# Patient Record
Sex: Female | Born: 1990 | Race: White | Hispanic: No | Marital: Single | Smoking: Current every day smoker
Health system: Southern US, Community
[De-identification: ages and names within clinical notes are randomized; demographics above are authoritative.]

## PROBLEM LIST (undated history)

## (undated) ENCOUNTER — Emergency Department: Admission: EM | Payer: Medicaid Other | Source: Home / Self Care

## (undated) DIAGNOSIS — L409 Psoriasis, unspecified: Secondary | ICD-10-CM

## (undated) DIAGNOSIS — Z972 Presence of dental prosthetic device (complete) (partial): Secondary | ICD-10-CM

## (undated) DIAGNOSIS — K219 Gastro-esophageal reflux disease without esophagitis: Secondary | ICD-10-CM

## (undated) DIAGNOSIS — N879 Dysplasia of cervix uteri, unspecified: Secondary | ICD-10-CM

## (undated) HISTORY — PX: APPENDECTOMY: SHX54

## (undated) HISTORY — PX: ABDOMINAL SURGERY: SHX537

---
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
## 2011-09-28 DIAGNOSIS — N879 Dysplasia of cervix uteri, unspecified: Secondary | ICD-10-CM

## 2011-09-28 HISTORY — DX: Dysplasia of cervix uteri, unspecified: N87.9

## 2011-10-02 ENCOUNTER — Emergency Department: Payer: Self-pay | Admitting: Emergency Medicine

## 2011-11-24 ENCOUNTER — Emergency Department: Payer: Self-pay | Admitting: Emergency Medicine

## 2011-11-26 LAB — BETA STREP CULTURE(ARMC)

## 2012-02-18 ENCOUNTER — Observation Stay: Payer: Self-pay | Admitting: Obstetrics and Gynecology

## 2012-02-18 LAB — COMPREHENSIVE METABOLIC PANEL
Albumin: 2.2 g/dL — ABNORMAL LOW (ref 3.4–5.0)
Alkaline Phosphatase: 133 U/L (ref 50–136)
Bilirubin,Total: 0.2 mg/dL (ref 0.2–1.0)
Calcium, Total: 8.4 mg/dL — ABNORMAL LOW (ref 8.5–10.1)
Chloride: 105 mmol/L (ref 98–107)
Co2: 25 mmol/L (ref 21–32)
Creatinine: 0.55 mg/dL — ABNORMAL LOW (ref 0.60–1.30)
EGFR (Non-African Amer.): >60
Glucose: 79 mg/dL (ref 65–99)
Potassium: 4 mmol/L (ref 3.5–5.1)
Sodium: 139 mmol/L (ref 136–145)

## 2012-02-18 LAB — CBC WITH DIFFERENTIAL/PLATELET
Basophil #: 0 10*3/uL (ref 0.0–0.1)
Basophil %: 0.2 %
Eosinophil #: 0.2 10*3/uL (ref 0.0–0.7)
Eosinophil %: 1.5 %
HCT: 35.8 % (ref 35.0–47.0)
HGB: 11.7 g/dL — ABNORMAL LOW (ref 12.0–16.0)
MCH: 29.6 pg (ref 26.0–34.0)
MCHC: 32.6 g/dL (ref 32.0–36.0)
MCV: 91 fL (ref 80–100)
Monocyte #: 1.2 x10 3/mm — ABNORMAL HIGH (ref 0.2–0.9)
Monocyte %: 7 %
Neutrophil %: 78.6 %
RBC: 3.93 10*6/uL (ref 3.80–5.20)
WBC: 16.6 10*3/uL — ABNORMAL HIGH (ref 3.6–11.0)

## 2012-02-18 LAB — URINALYSIS, COMPLETE
Bacteria: NONE SEEN
Bilirubin,UR: NEGATIVE
Blood: NEGATIVE
Blood: NEGATIVE
Glucose,UR: NEGATIVE mg/dL (ref 0–75)
Ketone: NEGATIVE
Leukocyte Esterase: NEGATIVE
Nitrite: NEGATIVE
Ph: 8 (ref 4.5–8.0)
Protein: NEGATIVE
RBC,UR: 2 /HPF (ref 0–5)
RBC,UR: 1 /HPF (ref 0–5)
Squamous Epithelial: 40
Squamous Epithelial: <1
WBC UR: 11 /HPF (ref 0–5)
WBC UR: 2 /HPF (ref 0–5)

## 2012-02-18 LAB — LIPASE, BLOOD: Lipase: 139 U/L (ref 73–393)

## 2012-02-18 LAB — AMYLASE: Amylase: 40 U/L (ref 25–115)

## 2012-02-19 LAB — CBC WITH DIFFERENTIAL/PLATELET
Basophil %: 0.6 %
Eosinophil #: 0.2 10*3/uL (ref 0.0–0.7)
Eosinophil %: 1.5 %
HCT: 34.5 % — ABNORMAL LOW (ref 35.0–47.0)
Lymphocyte %: 16.2 %
MCHC: 32.7 g/dL (ref 32.0–36.0)
MCV: 91 fL (ref 80–100)
Monocyte #: 1 x10 3/mm — ABNORMAL HIGH (ref 0.2–0.9)
Monocyte %: 7.4 %
Neutrophil #: 10.4 10*3/uL — ABNORMAL HIGH (ref 1.4–6.5)
Neutrophil %: 74.3 %
RBC: 3.82 10*6/uL (ref 3.80–5.20)
RDW: 13.3 % (ref 11.5–14.5)
WBC: 14 10*3/uL — ABNORMAL HIGH (ref 3.6–11.0)

## 2012-02-21 ENCOUNTER — Observation Stay: Payer: Self-pay | Admitting: Obstetrics and Gynecology

## 2012-02-21 LAB — URINALYSIS, COMPLETE
Bacteria: NONE SEEN
Blood: NEGATIVE
Glucose,UR: NEGATIVE mg/dL (ref 0–75)
Leukocyte Esterase: NEGATIVE
Nitrite: NEGATIVE
Ph: 7 (ref 4.5–8.0)
Protein: NEGATIVE
RBC,UR: <1 /HPF (ref 0–5)
Specific Gravity: 1.01 (ref 1.003–1.030)
WBC UR: 3 /HPF (ref 0–5)

## 2012-02-25 ENCOUNTER — Observation Stay: Payer: Self-pay | Admitting: Obstetrics and Gynecology

## 2012-03-20 ENCOUNTER — Observation Stay: Payer: Self-pay | Admitting: Obstetrics and Gynecology

## 2012-03-24 ENCOUNTER — Observation Stay: Payer: Self-pay | Admitting: Obstetrics and Gynecology

## 2012-03-24 LAB — PROTEIN / CREATININE RATIO, URINE
Protein, Random Urine: 30 mg/dL — ABNORMAL HIGH (ref 0–12)
Protein/Creat. Ratio: 249 mg/gCREAT — ABNORMAL HIGH (ref 0–200)

## 2012-03-24 LAB — PIH PROFILE
EGFR (Non-African Amer.): >60
Glucose: 65 mg/dL (ref 65–99)
HCT: 36.4 % (ref 35.0–47.0)
HGB: 11.6 g/dL — ABNORMAL LOW (ref 12.0–16.0)
MCV: 92 fL (ref 80–100)
Platelet: 214 10*3/uL (ref 150–440)
RBC: 3.94 10*6/uL (ref 3.80–5.20)
SGOT(AST): 13 U/L — ABNORMAL LOW (ref 15–37)
Uric Acid: 4.7 mg/dL (ref 2.6–6.0)

## 2012-03-26 ENCOUNTER — Inpatient Hospital Stay: Payer: Self-pay | Admitting: Obstetrics and Gynecology

## 2012-03-26 LAB — CBC WITH DIFFERENTIAL/PLATELET
Basophil #: 0 10*3/uL (ref 0.0–0.1)
Eosinophil #: 0.2 10*3/uL (ref 0.0–0.7)
Eosinophil %: 1.2 %
Lymphocyte #: 1.8 10*3/uL (ref 1.0–3.6)
Lymphocyte %: 13.1 %
MCH: 29.2 pg (ref 26.0–34.0)
MCHC: 32.2 g/dL (ref 32.0–36.0)
MCV: 91 fL (ref 80–100)
Monocyte #: 1.1 x10 3/mm — ABNORMAL HIGH (ref 0.2–0.9)
Monocyte %: 7.8 %
Neutrophil #: 10.5 10*3/uL — ABNORMAL HIGH (ref 1.4–6.5)
Neutrophil %: 77.7 %
Platelet: 221 10*3/uL (ref 150–440)
RBC: 3.85 10*6/uL (ref 3.80–5.20)
RDW: 14.2 % (ref 11.5–14.5)
WBC: 13.6 10*3/uL — ABNORMAL HIGH (ref 3.6–11.0)

## 2012-03-27 LAB — CBC WITH DIFFERENTIAL/PLATELET
Basophil %: 0.4 %
Eosinophil #: 0.2 10*3/uL (ref 0.0–0.7)
HCT: 36.8 % (ref 35.0–47.0)
HGB: 11.9 g/dL — ABNORMAL LOW (ref 12.0–16.0)
Lymphocyte #: 1.7 10*3/uL (ref 1.0–3.6)
Lymphocyte %: 9.8 %
MCHC: 32.2 g/dL (ref 32.0–36.0)
Monocyte %: 6.4 %
Neutrophil #: 14.4 10*3/uL — ABNORMAL HIGH (ref 1.4–6.5)
WBC: 17.5 10*3/uL — ABNORMAL HIGH (ref 3.6–11.0)

## 2012-03-29 LAB — HEMATOCRIT: HCT: 27.2 % — ABNORMAL LOW (ref 35.0–47.0)

## 2012-04-16 ENCOUNTER — Emergency Department: Payer: Self-pay | Admitting: Emergency Medicine

## 2012-04-16 LAB — COMPREHENSIVE METABOLIC PANEL
Albumin: 3.2 g/dL — ABNORMAL LOW (ref 3.4–5.0)
Alkaline Phosphatase: 117 U/L (ref 50–136)
BUN: 10 mg/dL (ref 7–18)
Bilirubin,Total: 0.6 mg/dL (ref 0.2–1.0)
Co2: 23 mmol/L (ref 21–32)
Creatinine: 0.73 mg/dL (ref 0.60–1.30)
Osmolality: 282 (ref 275–301)
Potassium: 3.9 mmol/L (ref 3.5–5.1)
SGPT (ALT): 17 U/L
Sodium: 142 mmol/L (ref 136–145)
Total Protein: 7.1 g/dL (ref 6.4–8.2)

## 2012-04-16 LAB — CBC
MCH: 28.4 pg (ref 26.0–34.0)
MCHC: 32 g/dL (ref 32.0–36.0)
MCV: 89 fL (ref 80–100)
Platelet: 387 10*3/uL (ref 150–440)
RBC: 4.26 10*6/uL (ref 3.80–5.20)

## 2012-04-16 LAB — URINALYSIS, COMPLETE
Bilirubin,UR: NEGATIVE
Blood: NEGATIVE
Ketone: NEGATIVE
Ph: 5 (ref 4.5–8.0)
Protein: NEGATIVE
Specific Gravity: 1.021 (ref 1.003–1.030)
Squamous Epithelial: 2

## 2013-07-11 ENCOUNTER — Emergency Department: Payer: Self-pay | Admitting: Emergency Medicine

## 2013-12-22 ENCOUNTER — Emergency Department: Payer: Self-pay | Admitting: Emergency Medicine

## 2013-12-22 LAB — CBC WITH DIFFERENTIAL/PLATELET
BASOS ABS: 0 10*3/uL (ref 0.0–0.1)
Basophil %: 0.1 %
Eosinophil #: 0.3 10*3/uL (ref 0.0–0.7)
Eosinophil %: 1.7 %
HCT: 47.4 % — ABNORMAL HIGH (ref 35.0–47.0)
HGB: 15.6 g/dL (ref 12.0–16.0)
LYMPHS ABS: 0.9 10*3/uL — AB (ref 1.0–3.6)
Lymphocyte %: 4.9 %
MCH: 29.7 pg (ref 26.0–34.0)
MCHC: 32.8 g/dL (ref 32.0–36.0)
MCV: 90 fL (ref 80–100)
MONOS PCT: 3.1 %
Monocyte #: 0.6 x10 3/mm (ref 0.2–0.9)
NEUTROS ABS: 17.2 10*3/uL — AB (ref 1.4–6.5)
Neutrophil %: 90.2 %
PLATELETS: 250 10*3/uL (ref 150–440)
RBC: 5.25 10*6/uL — ABNORMAL HIGH (ref 3.80–5.20)
RDW: 12.8 % (ref 11.5–14.5)
WBC: 19.1 10*3/uL — ABNORMAL HIGH (ref 3.6–11.0)

## 2013-12-22 LAB — COMPREHENSIVE METABOLIC PANEL
ALBUMIN: 3.3 g/dL — AB (ref 3.4–5.0)
ALT: 14 U/L (ref 12–78)
Alkaline Phosphatase: 93 U/L
Anion Gap: 6 — ABNORMAL LOW (ref 7–16)
BUN: 11 mg/dL (ref 7–18)
Bilirubin,Total: 0.7 mg/dL (ref 0.2–1.0)
Calcium, Total: 7.9 mg/dL — ABNORMAL LOW (ref 8.5–10.1)
Chloride: 113 mmol/L — ABNORMAL HIGH (ref 98–107)
Co2: 20 mmol/L — ABNORMAL LOW (ref 21–32)
Creatinine: 0.74 mg/dL (ref 0.60–1.30)
EGFR (African American): >60
EGFR (Non-African Amer.): >60
GLUCOSE: 83 mg/dL (ref 65–99)
Osmolality: 276 (ref 275–301)
POTASSIUM: 3.6 mmol/L (ref 3.5–5.1)
SGOT(AST): 22 U/L (ref 15–37)
Sodium: 139 mmol/L (ref 136–145)
TOTAL PROTEIN: 6.5 g/dL (ref 6.4–8.2)

## 2013-12-22 LAB — URINALYSIS, COMPLETE
BILIRUBIN, UR: NEGATIVE
Bacteria: NONE SEEN
Blood: NEGATIVE
GLUCOSE, UR: NEGATIVE mg/dL (ref 0–75)
KETONE: NEGATIVE
Leukocyte Esterase: NEGATIVE
NITRITE: NEGATIVE
Ph: 5 (ref 4.5–8.0)
Protein: NEGATIVE
RBC,UR: <1 /HPF (ref 0–5)
Specific Gravity: 1.023 (ref 1.003–1.030)
Squamous Epithelial: 2

## 2013-12-22 LAB — HCG, QUANTITATIVE, PREGNANCY

## 2013-12-22 LAB — GC/CHLAMYDIA PROBE AMP

## 2013-12-22 LAB — LIPASE, BLOOD: Lipase: 83 U/L (ref 73–393)

## 2013-12-22 LAB — WET PREP, GENITAL

## 2014-11-28 ENCOUNTER — Emergency Department: Payer: Self-pay | Admitting: Emergency Medicine

## 2015-01-19 NOTE — Discharge Summary (Signed)
PATIENT NAME:  Mandy Hansen, Mandy Hansen MR#:  [id number] DATE OF BIRTH:  Sep 19, 1991  DATE OF ADMISSION:  03/26/2012 DATE OF DISCHARGE:  03/31/2012  PRINCIPLE PROCEDURE: Low transverse cesarean section.   HOSPITAL COURSE: The patient underwent the above procedure, uncomplicated. Postoperative day #1 hematocrit was 27.2%.  She was discharged to home in good condition on postoperative day #3. The patient will follow up with Dr. Feliberto GottronSchermerhorn in two weeks for wound care or before if she has wound drainage, fever, nausea, or vomiting.   DISCHARGE MEDICATIONS: Vicodin, prenatal vitamins, ibuprofen.   ____________________________ Suzy Bouchardhomas J. Everitt Wenner, MD tjs:bjt Hansen: 04/07/2012 10:15:18 ET T: 04/07/2012 11:31:01 ET JOB#: [id number]  cc: Suzy Bouchardhomas J. Lason Eveland, MD, <Dictator> Suzy BouchardHOMAS J Shaguana Love MD ELECTRONICALLY SIGNED 04/10/2012 8:45

## 2015-01-19 NOTE — Op Note (Signed)
PATIENT NAME:  Mandy Hansen, Mandy Hansen MR#:  [id number] DATE OF BIRTH:  12-Oct-1990  DATE OF PROCEDURE:  03/28/2012  PREOPERATIVE DIAGNOSIS: Arrest of descent.  POSTOPERATIVE DIAGNOSES: 1. Arrest of descent.  2. Thick meconium staining.   PROCEDURES:  1. Primary low transverse Cesarean section.  2. On-Q pump placement.   ANESTHESIA: Spinal.   SURGEON: Suzy Bouchardhomas J. Hadi Dubin, MD   FIRST ASSISTANT: Ore, scrub tech    INDICATIONS: This is a 24 year old gravida 1 para 0 patient laboring greater than 24 hours, did not progress past complete/complete/+2 station. Fetal tachycardia noted. The patient pushed for three hours.   PROCEDURE: After adequate spinal anesthesia, the patient was placed in the dorsal supine position with a hip roll under the right side. The patient's abdomen was prepped and draped in a normal sterile fashion. Pfannenstiel incision was made two fingerbreadths above the symphysis pubis. Sharp dissection was used to identify the fascia. Fascia was opened in the midline in a transverse fashion. Superior aspect of the fascia was grasped with Kocher clamps and the recti muscles dissected free. Inferior aspect of the fascia was grasped with Kocher clamps. Pyramidalis muscle was dissected free. Entry into the peritoneal cavity was accomplished sharply. Vesicouterine peritoneal fold was identified and opened and a bladder flap was created and bladder was reflected inferiorly. Low transverse uterine incision was made. Upon entry into the endometrial cavity, thick meconium was noted. Vaginal hand allowed for elevation of extremely wedged fetal head. Head was ultimately delivered and body was delivered without difficulty. Cord was doubly clamped. Infant boy was passed to nursery staff before spontaneous cry. Intubation was noted with no meconium below the cords. Apgar scores 8 and 9. Blood gas 7.15, pCO2 70, -5 base excess. Placenta was manually delivered and 40 units of intravenous Pitocin was  administered, 250 mcg of Hemabate also administered given uterine atony. Endometrial cavity was wiped clean after exteriorizing the uterus. Uterine incision was closed with 1 chromic suture in a running locking fashion with good approximation of edges. Good hemostasis was noted. Fallopian tubes and ovaries appeared normal. Posterior cul-de-sac was irrigated and suctioned. Uterus was placed back into the abdominal cavity. Paracolic gutters were wiped clean with laparotomy tape. Uterine incision again appeared hemostatic. Intercede was placed over the uterine incision in a T-shaped fashion. Superior aspect of the fascia was then grasped and the On-Q pump catheters were placed subfascially and the fascia was then closed over top of these catheters with 0 Vicryl suture. Subcutaneous tissues were irrigated and bovied for hemostasis and skin was reapproximated with staples. On-Q pump catheters were then secured with Dermabond and were then loaded with 5 mL of 0.5% Marcaine. A sterile dressing was applied. No complications.   Estimated blood loss 500 mL. Intraoperative fluids 500 mL. Urine output 45 mL. The patient did receive 2 grams IV Ancef prior to commencement of the case.    ____________________________ Suzy Bouchardhomas J. Lenita Peregrina, MD tjs:drc Hansen: 03/28/2012 02:42:03 ET T: 03/28/2012 11:56:54 ET JOB#: [id number]  cc: Suzy Bouchardhomas J. Maeli Spacek, MD, <Dictator> Suzy BouchardHOMAS J Tiaria Biby MD ELECTRONICALLY SIGNED 03/29/2012 9:06

## 2015-02-04 NOTE — H&P (Signed)
L&D Evaluation:  History:   [facility] 24[id number]    Presents with abdominal pain, eval elev BP    Patient's Medical History No Chronic Illness    Patient's Surgical History none    Medications Pre Natal Vitamins    Allergies NKDA    Social History THC    Family History Non-Contributory   ROS:   ROS All systems were reviewed.  HEENT, CNS, GI, GU, Respiratory, CV, Renal and Musculoskeletal systems were found to be normal., UC's Q15 min   Exam:   General no apparent distress    Heart normal sinus rhythm    Abdomen gravid, non-tender    Estimated Fetal Weight Average for gestational age    Edema 1+    Reflexes 1+    Clonus negative    Mebranes Intact    FHT normal rate with no decels    Ucx irregular   Impression:   Impression evaluation for PIH, R/O labor   Plan:   Comments await labs will induce by Monday; sooner if pre-eclampsia evident BP 130's/60's here   Electronic Signatures: Margaretha GlassingEvans, Ricky L (MD)  (Signed 28-Jun-13 17:11)  Authored: L&D Evaluation   Last Updated: 28-Jun-13 17:11 by Margaretha GlassingEvans, Ricky L (MD)

## 2015-02-04 NOTE — H&P (Signed)
L&D Evaluation:  History:   HPI 24 yo G1P0 EDC 03/20/12 @ 35+4 with 3 day h/o worsening left flank pain presented to ER for eval. + D-Dimer prompted ED to do a chest CT( negative). Pt witth no N/V/ fever . Elevated WBC 16K with shift. Appetite nl. No dysuria , n o h/o urolithiasis or kidney infection.    Patient's Medical History No Chronic Illness  cervical dysplasia    Patient's Surgical History wisdom tooth extraction    Medications Pre Natal Vitamins    Allergies NKDA    Social History tobacco  3 cigs / day    Family History Non-Contributory   ROS:   ROS All systems were reviewed.  HEENT, CNS, GI, GU, Respiratory, CV, Renal and Musculoskeletal systems were found to be normal.   Exam:   Vital Signs BP 120-140's /60-80's    Urine Protein + LE and WBC + epithelial cells    General no apparent distress    Mental Status clear    Chest clear    Heart normal sinus rhythm    Abdomen left flank TTP + CVAT left    Back CVAT, left    Edema no edema    Pelvic cervix closed    Mebranes Intact    FHT normal rate with no decels    FHT Description reactive NST    Fetal Heart Rate 130    Ucx q 7 min   Impression:   Impression left flank pain  elevated WBC possible hydronephritis( early ) being afebrile. Pt is an active smoker and has chronic cough possible costochondritis. Reassuring fetal monitoring   Plan:   Plan cath UA , renal u/s looking for hydronephrosis. Start ancef 2 gm q 6 hrs iv. Will observe overnight. Norco prn pain   Electronic Signatures: Schermerhorn, Ihor Austinhomas J (MD)  (Signed [phone]-May-13 20:10)  Authored: L&D Evaluation   Last Updated: 24-May-13 20:10 by Schermerhorn, Ihor Austinhomas J (MD)

## 2015-02-04 NOTE — H&P (Signed)
L&D Evaluation:  History:   [facility] 24 [facility]    Presents with abdominal pain, contractions, rib pain evaluated 5/25    Patient's Medical History No Chronic Illness    Patient's Surgical History none    Medications Pre Natal Vitamins    Allergies NKDA    Family History Non-Contributory   ROS:   ROS left rib pain with cough    General normal    HEENT normal    CNS normal    GI normal    GU normal    Resp cough    CV normal    Renal normal   Exam:   Vital Signs stable    General unjcomfortable with UC's, in area of rib pain    Chest clear    Abdomen gravid, non-tender    Estimated Fetal Weight Average for gestational age    Back no CVAT    FHT normal rate with no decels    Ucx irregular    Skin dry    Other felt a "crack" in the previously noted area of tenderness at left rib this am with a strong cough   Impression:   Impression PTL, probable fractured rib   Plan:   Comments evaluate for cervical change pain mgt for fractured rib   Electronic Signatures: Margaretha GlassingEvans, Ricky L (MD)  (Signed 27-May-13 11:18)  Authored: L&D Evaluation   Last Updated: 27-May-13 11:18 by Margaretha GlassingEvans, Ricky L (MD)

## 2015-05-08 ENCOUNTER — Emergency Department
Admission: EM | Admit: 2015-05-08 | Discharge: 2015-05-08 | Disposition: A | Discharge status: Home / Self Care | Payer: Medicaid Other | Attending: Emergency Medicine | Admitting: Emergency Medicine

## 2015-05-08 ENCOUNTER — Emergency Department: Payer: Medicaid Other

## 2015-05-08 ENCOUNTER — Encounter: Payer: Self-pay | Admitting: *Deleted

## 2015-05-08 DIAGNOSIS — Z79899 Other long term (current) drug therapy: Secondary | ICD-10-CM | POA: Diagnosis not present

## 2015-05-08 DIAGNOSIS — N9489 Other specified conditions associated with female genital organs and menstrual cycle: Secondary | ICD-10-CM | POA: Insufficient documentation

## 2015-05-08 DIAGNOSIS — R109 Unspecified abdominal pain: Secondary | ICD-10-CM | POA: Diagnosis present

## 2015-05-08 DIAGNOSIS — R1033 Periumbilical pain: Secondary | ICD-10-CM

## 2015-05-08 DIAGNOSIS — Z3202 Encounter for pregnancy test, result negative: Secondary | ICD-10-CM | POA: Diagnosis not present

## 2015-05-08 DIAGNOSIS — Z720 Tobacco use: Secondary | ICD-10-CM | POA: Insufficient documentation

## 2015-05-08 DIAGNOSIS — Z792 Long term (current) use of antibiotics: Secondary | ICD-10-CM | POA: Insufficient documentation

## 2015-05-08 LAB — COMPREHENSIVE METABOLIC PANEL
ALBUMIN: 4.3 g/dL (ref 3.5–5.0)
ALT: 18 U/L (ref 14–54)
ANION GAP: 7 (ref 5–15)
AST: 22 U/L (ref 15–41)
Alkaline Phosphatase: 55 U/L (ref 38–126)
BUN: 13 mg/dL (ref 6–20)
CALCIUM: 9.5 mg/dL (ref 8.9–10.3)
CO2: 23 mmol/L (ref 22–32)
CREATININE: 0.74 mg/dL (ref 0.44–1.00)
Chloride: 108 mmol/L (ref 101–111)
GFR calc non Af Amer: >60 mL/min (ref >60)
GLUCOSE: 101 mg/dL — AB (ref 65–99)
Potassium: 4 mmol/L (ref 3.5–5.1)
Sodium: 138 mmol/L (ref 135–145)
TOTAL PROTEIN: 7.3 g/dL (ref 6.5–8.1)
Total Bilirubin: 0.7 mg/dL (ref 0.3–1.2)

## 2015-05-08 LAB — URINALYSIS COMPLETE WITH MICROSCOPIC (ARMC ONLY)
BILIRUBIN URINE: NEGATIVE
Glucose, UA: NEGATIVE mg/dL
Hgb urine dipstick: NEGATIVE
Ketones, ur: NEGATIVE mg/dL
Leukocytes, UA: NEGATIVE
Nitrite: NEGATIVE
PROTEIN: NEGATIVE mg/dL
Specific Gravity, Urine: 1.012 (ref 1.005–1.030)
pH: 7 (ref 5.0–8.0)

## 2015-05-08 LAB — CBC
HCT: 43.1 % (ref 35.0–47.0)
Hemoglobin: 14.1 g/dL (ref 12.0–16.0)
MCH: 29.8 pg (ref 26.0–34.0)
MCHC: 32.8 g/dL (ref 32.0–36.0)
MCV: 90.7 fL (ref 80.0–100.0)
PLATELETS: 260 10*3/uL (ref 150–440)
RBC: 4.75 MIL/uL (ref 3.80–5.20)
RDW: 13.1 % (ref 11.5–14.5)
WBC: 8.4 10*3/uL (ref 3.6–11.0)

## 2015-05-08 LAB — POCT PREGNANCY, URINE: PREG TEST UR: NEGATIVE

## 2015-05-08 LAB — LIPASE, BLOOD: LIPASE: 27 U/L (ref 22–51)

## 2015-05-08 MED ORDER — OXYCODONE-ACETAMINOPHEN 5-325 MG PO TABS
1.0000 | ORAL_TABLET | Freq: Once | ORAL | Status: AC
  Administered 2015-05-08: 1 via ORAL

## 2015-05-08 MED ORDER — ONDANSETRON HCL 4 MG/2ML IJ SOLN
INTRAMUSCULAR | Status: AC
  Administered 2015-05-08: 4 mg via INTRAVENOUS
  Filled 2015-05-08: qty 2

## 2015-05-08 MED ORDER — HYDROMORPHONE HCL 1 MG/ML IJ SOLN
0.5000 mg | INTRAMUSCULAR | Status: AC | PRN
  Administered 2015-05-08 (×2): 0.5 mg via INTRAVENOUS
  Filled 2015-05-08: qty 1

## 2015-05-08 MED ORDER — IOHEXOL 300 MG/ML  SOLN
100.0000 mL | Freq: Once | INTRAMUSCULAR | Status: AC | PRN
  Administered 2015-05-08: 100 mL via INTRAVENOUS

## 2015-05-08 MED ORDER — OXYCODONE-ACETAMINOPHEN 5-325 MG PO TABS: 1.0000 | ORAL_TABLET | Freq: Four times a day (QID) | ORAL | Status: DC | PRN

## 2015-05-08 MED ORDER — IOHEXOL 240 MG/ML SOLN
25.0000 mL | Freq: Once | INTRAMUSCULAR | Status: AC | PRN
  Administered 2015-05-08: 25 mL via ORAL

## 2015-05-08 MED ORDER — OXYCODONE-ACETAMINOPHEN 5-325 MG PO TABS
ORAL_TABLET | ORAL | Status: AC
  Filled 2015-05-08: qty 1

## 2015-05-08 MED ORDER — ONDANSETRON HCL 4 MG/2ML IJ SOLN
4.0000 mg | Freq: Once | INTRAMUSCULAR | Status: AC
  Administered 2015-05-08: 4 mg via INTRAVENOUS

## 2015-05-08 MED ORDER — SODIUM CHLORIDE 0.9 % IV BOLUS (SEPSIS)
1000.0000 mL | Freq: Once | INTRAVENOUS | Status: AC
  Administered 2015-05-08: 1000 mL via INTRAVENOUS

## 2015-05-08 MED ORDER — ONDANSETRON HCL 4 MG/2ML IJ SOLN
4.0000 mg | Freq: Once | INTRAMUSCULAR | Status: AC
  Administered 2015-05-08: 4 mg via INTRAVENOUS
  Filled 2015-05-08: qty 2

## 2015-05-08 NOTE — ED Provider Notes (Signed)
[facility] Emergency Department Provider Note  ____________________________________________  Time seen: 11:25 AM  I have reviewed the triage vital signs and the nursing notes.   HISTORY  Chief Complaint Abdominal Pain     HPI Mandy Hansen is a 24 y.o. female with abdominal pain. She reports this pain began in March and has been waxing and waning since. She was seen in the emergency department earlier this year for the abdominal pain and had an ultrasound of her pelvic area done and told to follow-up with GYN area she has not had a CT scan, per her report.  The pain has been worse for the past 2-3 days. She went to CT when this morning. During the abdominal exam by the gynecologist, she began to have acutely worse pain and it has continued since that exam. She was sent to the emergency department for further evaluation.  There is no known gynecologic problem diagnosis to date. She denies any ongoing medical problems.   History reviewed. No pertinent past medical history.  There are no active problems to display for this patient.   Past Surgical History  Procedure Laterality Date  . Cesarean section      Current Outpatient Rx  Name  Route  Sig  Dispense  Refill  . acetaminophen (TYLENOL) 325 MG tablet   Oral   Take 650 mg by mouth every 4 (four) hours as needed for mild pain, moderate pain, fever or headache.          . ibuprofen (ADVIL,MOTRIN) 200 MG tablet   Oral   Take 800 mg by mouth every 6 (six) hours as needed for fever, headache, mild pain, moderate pain or cramping.          . medroxyPROGESTERone (DEPO-PROVERA) 150 MG/ML injection   Intramuscular   Inject 1 mL into the muscle every 3 (three) months.         . potassium chloride (MICRO-K) 10 MEQ CR capsule   Oral   Take 1 capsule by mouth 2 (two) times daily.         [name] oxyCODONE-acetaminophen (ROXICET) 5-325 MG per tablet   Oral   Take 1 tablet by mouth every 6 (six) hours  as needed.   16 tablet   0     Allergies Review of patient's allergies indicates no known allergies.  No family history on file.  Social History Social History  Substance Use Topics  . Smoking status: Current Every Day Smoker  . Smokeless tobacco: None  . Alcohol Use: No    Review of Systems  Constitutional: Negative for fever. ENT: Negative for sore throat. Cardiovascular: Negative for chest pain. Respiratory: Negative for shortness of breath. Gastrointestinal: Positive for abdominal pain. See history of present illness. Genitourinary: Negative for dysuria. Musculoskeletal: No myalgias or injuries. Skin: Negative for rash. Neurological: Negative for headaches   10-point ROS otherwise negative.  ____________________________________________   PHYSICAL EXAM:  VITAL SIGNS: ED Triage Vitals  Enc Vitals Group     BP 05/08/15 0955 133/69 mmHg     Pulse Rate 05/08/15 0955 95     Resp 05/08/15 0955 20     Temp 05/08/15 0955 98.1 F (36.7 C)     Temp Source 05/08/15 0955 Oral     SpO2 05/08/15 0955 98 %     Weight 05/08/15 0955 142 lb (64.411 kg)     Height 05/08/15 0955  (1.6 m)     Head Cir --  Peak Flow --      Pain Score 05/08/15 0955 10     Pain Loc --      Pain Edu? --      Excl. in GC? --     Constitutional:  Alert and oriented. Patient is in moderate distress with abdominal discomfort. ENT   Head: Normocephalic and atraumatic.   Nose: No congestion/rhinnorhea.   Mouth/Throat: Mucous membranes are moist. Cardiovascular: Normal rate, regular rhythm, no murmur noted Respiratory:  Normal respiratory effort, no tachypnea.    Breath sounds are clear and equal bilaterally.  Gastrointestinal: Soft.  Significant tenderness, mostly in the right lower quadrant and some in the area umbilical area. She has mild guarding. No distention.  Back: No muscle spasm, no tenderness, no CVA tenderness. Musculoskeletal: No deformity noted. Nontender with  normal range of motion in all extremities.  No noted edema. Neurologic:  Normal speech and language. No gross focal neurologic deficits are appreciated.  Skin:  Skin is warm, dry. No rash noted. Psychiatric: Mood and affect are normal given the discomfort and pain she is in. Intact thought process.[name] Speech and behavior are normal.  ____________________________________________    LABS (pertinent positives/negatives)  Labs Reviewed  COMPREHENSIVE METABOLIC PANEL - Abnormal; Notable for the following:    Glucose, Bld 101 (*)    All other components within normal limits  URINALYSIS COMPLETEWITH MICROSCOPIC ([facility] ONLY) - Abnormal; Notable for the following:    Color, Urine YELLOW (*)    APPearance HAZY (*)    Bacteria, UA RARE (*)    Squamous Epithelial / LPF 0-5 (*)    All other components within normal limits  LIPASE, BLOOD  CBC  POC URINE PREG, ED  POCT PREGNANCY, URINE     ____________________________________________   RADIOLOGY  CT abdomen and pelvis  IMPRESSION: 1. No acute abnormality identified in the abdomen or pelvis. 2. Multiple enlarged veins surrounding the uterine fundus and in the adnexa, greater on the left and slightly more prominent than on the prior study. Pelvic congestion syndrome is a consideration in the appropriate clinical setting.   ____________________________________________   INITIAL IMPRESSION / ASSESSMENT AND PLAN / ED COURSE  Pertinent labs & imaging results that were available during my care of the patient were reviewed by me and considered in my medical decision making (see chart for details).   24 year old female with acute on chronic abdominal pain. Given the location and severity of her pain, a CT scan is indicated this time to evaluate for possible prior appendicitis with abscess versus inflammatory bowel versus other pathology. The patient is in agreement with this plan.  ----------------------------------------- 2:12 PM on  05/08/2015 -----------------------------------------  Patient has had her CT scan. The results are pending. She has had some additional nausea. Her pain control with the initial dose of Dilaudid has been moderate. I believe she could use additional medication. I discussed this with nursing and they will use the when necessary order I placed earlier.  ----------------------------------------- 2:32 PM on 05/08/2015 -----------------------------------------  CT scan does not show any acute abnormality. They do note that she has some enlarged veins surrounding the uterine fundus and in the adnexa. Differential diagnosis includes pelvic congestion syndrome.  I will prescribe further pain medication for home use. We will discharge her home with follow-up with [facility].  ____________________________________________   FINAL CLINICAL IMPRESSION(S) / ED DIAGNOSES  Final diagnoses:  Periumbilical abdominal pain  Female pelvic congestion syndrome      Darien Ramus, MD 05/08/15  1445 

## 2015-05-08 NOTE — ED Notes (Signed)
Mid abd pain 

## 2015-05-08 NOTE — Discharge Instructions (Signed)
Your CT scan showed you have some dilated blood vessels around your uterus and adnexa (your reproductive organs). This could be an indication of a condition called pelvic congestion syndrome. Continue your follow-up with both GI and GYN. Return to the emergency department if you have worsening pain, fever, or other urgent concerns. Take oxycodone as prescribed for severe pain.   Do not drink alcohol, drive, work with machinery, or participate in any other potentially dangerous activities while taking this medication as it may impair you.   Do not take this medication with any other sedating medications, either prescription or over-the-counter.   If you were prescribed Percocet or Vicodin, do not take these with acetaminophen (Tylenol) as it is already contained within these medications.  This medication is an opiate (or narcotic) pain medication and can be habit forming. Use only the amount needed to achieve adequate pain control.  Do not use this medication or use with extreme caution if you have a history of opiate abuse or dependence.    If you are on a pain contract with your primary care doctor or a pain specialist, be sure to let them know you were prescribed this medication today from the [facility] Emergency Department.    This medication is intended for your use only - do not give any to anyone else and keep it in a secure place where nobody else, especially children, have access to it.    Narcotics can cause or worsen constipation. You may want to consider taking an over-the-counter stool softener while you are taking this medication.    Abdominal Pain Many things can cause belly (abdominal) pain. Most times, the belly pain is not dangerous. Many cases of belly pain can be watched and treated at home. HOME CARE   Do not take medicines that help you go poop (laxatives) unless told to by your doctor.  Only take medicine as told by your doctor.  Eat or drink as told by your  doctor. Your doctor will tell you if you should be on a special diet. GET HELP IF:  You do not know what is causing your belly pain.  You have belly pain while you are sick to your stomach (nauseous) or have runny poop (diarrhea).  You have pain while you pee or poop.  Your belly pain wakes you up at night.  You have belly pain that gets worse or better when you eat.  You have belly pain that gets worse when you eat fatty foods.  You have a fever. GET HELP RIGHT AWAY IF:   The pain does not go away within 2 hours.  You keep throwing up (vomiting).  The pain changes and is only in the right or left part of the belly.  You have bloody or tarry looking poop. MAKE SURE YOU:   Understand these instructions.  Will watch your condition.  Will get help right away if you are not doing well or get worse. Document Released: 03/01/2008 Document Revised: 09/18/2013 Document Reviewed: 05/23/2013 [facility] Patient Information 2015 [place], [place]. This information is not intended to replace advice given to you by your health care provider. Make sure you discuss any questions you have with your health care provider.

## 2015-05-20 ENCOUNTER — Emergency Department
Admission: EM | Admit: 2015-05-20 | Discharge: 2015-05-20 | Disposition: A | Discharge status: Home / Self Care | Payer: Medicaid Other | Attending: Emergency Medicine | Admitting: Emergency Medicine

## 2015-05-20 ENCOUNTER — Emergency Department: Payer: Medicaid Other

## 2015-05-20 ENCOUNTER — Encounter: Payer: Self-pay | Admitting: Emergency Medicine

## 2015-05-20 DIAGNOSIS — M79662 Pain in left lower leg: Secondary | ICD-10-CM | POA: Diagnosis not present

## 2015-05-20 DIAGNOSIS — R1033 Periumbilical pain: Secondary | ICD-10-CM | POA: Insufficient documentation

## 2015-05-20 DIAGNOSIS — Z720 Tobacco use: Secondary | ICD-10-CM | POA: Diagnosis not present

## 2015-05-20 DIAGNOSIS — M79605 Pain in left leg: Secondary | ICD-10-CM

## 2015-05-20 DIAGNOSIS — Z79899 Other long term (current) drug therapy: Secondary | ICD-10-CM | POA: Diagnosis not present

## 2015-05-20 DIAGNOSIS — R112 Nausea with vomiting, unspecified: Secondary | ICD-10-CM | POA: Diagnosis not present

## 2015-05-20 DIAGNOSIS — R109 Unspecified abdominal pain: Secondary | ICD-10-CM | POA: Diagnosis present

## 2015-05-20 LAB — COMPREHENSIVE METABOLIC PANEL
ALK PHOS: 58 U/L (ref 38–126)
ALT: 15 U/L (ref 14–54)
AST: 20 U/L (ref 15–41)
Albumin: 4.1 g/dL (ref 3.5–5.0)
Anion gap: 7 (ref 5–15)
BUN: 10 mg/dL (ref 6–20)
CALCIUM: 9.3 mg/dL (ref 8.9–10.3)
CO2: 23 mmol/L (ref 22–32)
CREATININE: 0.7 mg/dL (ref 0.44–1.00)
Chloride: 110 mmol/L (ref 101–111)
Glucose, Bld: 82 mg/dL (ref 65–99)
Potassium: 3.9 mmol/L (ref 3.5–5.1)
Sodium: 140 mmol/L (ref 135–145)
TOTAL PROTEIN: 6.6 g/dL (ref 6.5–8.1)
Total Bilirubin: 0.5 mg/dL (ref 0.3–1.2)

## 2015-05-20 LAB — CBC
HEMATOCRIT: 38.2 % (ref 35.0–47.0)
HEMOGLOBIN: 12.8 g/dL (ref 12.0–16.0)
MCH: 30.5 pg (ref 26.0–34.0)
MCHC: 33.4 g/dL (ref 32.0–36.0)
MCV: 91.3 fL (ref 80.0–100.0)
Platelets: 211 10*3/uL (ref 150–440)
RBC: 4.18 MIL/uL (ref 3.80–5.20)
RDW: 13.1 % (ref 11.5–14.5)
WBC: 12.8 10*3/uL — ABNORMAL HIGH (ref 3.6–11.0)

## 2015-05-20 MED ORDER — GI COCKTAIL ~~LOC~~
30.0000 mL | Freq: Once | ORAL | Status: AC
  Administered 2015-05-20: 30 mL via ORAL
  Filled 2015-05-20: qty 30

## 2015-05-20 MED ORDER — BUTALBITAL-APAP-CAFFEINE 50-325-40 MG PO TABS
2.0000 | ORAL_TABLET | Freq: Once | ORAL | Status: AC
  Administered 2015-05-20: 2 via ORAL
  Filled 2015-05-20: qty 2

## 2015-05-20 MED ORDER — SODIUM CHLORIDE 0.9 % IV BOLUS (SEPSIS)
1000.0000 mL | Freq: Once | INTRAVENOUS | Status: AC
  Administered 2015-05-20: 1000 mL via INTRAVENOUS

## 2015-05-20 MED ORDER — ONDANSETRON HCL 4 MG/2ML IJ SOLN
4.0000 mg | Freq: Once | INTRAMUSCULAR | Status: AC
  Administered 2015-05-20: 4 mg via INTRAVENOUS
  Filled 2015-05-20: qty 2

## 2015-05-20 MED ORDER — MORPHINE SULFATE (PF) 4 MG/ML IV SOLN
4.0000 mg | Freq: Once | INTRAVENOUS | Status: AC
  Administered 2015-05-20: 4 mg via INTRAVENOUS
  Filled 2015-05-20: qty 1

## 2015-05-20 MED ORDER — TRAMADOL HCL 50 MG PO TABS
50.0000 mg | ORAL_TABLET | Freq: Once | ORAL | Status: AC
  Administered 2015-05-20: 50 mg via ORAL
  Filled 2015-05-20: qty 1

## 2015-05-20 MED ORDER — HYDROMORPHONE HCL 1 MG/ML IJ SOLN
1.0000 mg | Freq: Once | INTRAMUSCULAR | Status: AC
Start: 2015-05-20 — End: 2015-05-20
  Administered 2015-05-20: 1 mg via INTRAVENOUS
  Filled 2015-05-20: qty 1

## 2015-05-20 MED ORDER — OXYCODONE-ACETAMINOPHEN 5-325 MG PO TABS: 1.0000 | ORAL_TABLET | Freq: Four times a day (QID) | ORAL | Status: DC | PRN

## 2015-05-20 NOTE — ED Notes (Signed)
Patient transported to Ultrasound 

## 2015-05-20 NOTE — ED Provider Notes (Signed)
IMPRESSION: No evidence of left lower extremity deep venous thrombosis.  Patient without DVT here, pain is improved. She'll be discharged with close follow-up with her doctor tomorrow as scheduled.  Emily Filbert, MD 05/20/15 [phone]

## 2015-05-20 NOTE — Discharge Instructions (Signed)
Pelvic Pain Female pelvic pain can be caused by many different things and start from a variety of places. Pelvic pain refers to pain that is located in the lower half of the abdomen and between your hips. The pain may occur over a short period of time (acute) or may be reoccurring (chronic). The cause of pelvic pain may be related to disorders affecting the female reproductive organs (gynecologic), but it may also be related to the bladder, kidney stones, an intestinal complication, or muscle or skeletal problems. Getting help right away for pelvic pain is important, especially if there has been severe, sharp, or a sudden onset of unusual pain. It is also important to get help right away because some types of pelvic pain can be life threatening.  CAUSES  Below are only some of the causes of pelvic pain. The causes of pelvic pain can be in one of several categories.   Gynecologic.  Pelvic inflammatory disease.  Sexually transmitted infection.  Ovarian cyst or a twisted ovarian ligament (ovarian torsion).  Uterine lining that grows outside the uterus (endometriosis).  Fibroids, cysts, or tumors.  Ovulation.  Pregnancy.  Pregnancy that occurs outside the uterus (ectopic pregnancy).  Miscarriage.  Labor.  Abruption of the placenta or ruptured uterus.  Infection.  Uterine infection (endometritis).  Bladder infection.  Diverticulitis.  Miscarriage related to a uterine infection (septic abortion).  Bladder.  Inflammation of the bladder (cystitis).  Kidney stone(s).  Gastrointestinal.  Constipation.  Diverticulitis.  Neurologic.  Trauma.  Feeling pelvic pain because of mental or emotional causes (psychosomatic).  Cancers of the bowel or pelvis. EVALUATION  Your caregiver will want to take a careful history of your concerns. This includes recent changes in your health, a careful gynecologic history of your periods (menses), and a sexual history. Obtaining your family  history and medical history is also important. Your caregiver may suggest a pelvic exam. A pelvic exam will help identify the location and severity of the pain. It also helps in the evaluation of which organ system may be involved. In order to identify the cause of the pelvic pain and be properly treated, your caregiver may order tests. These tests may include:   A pregnancy test.  Pelvic ultrasonography.  An X-ray exam of the abdomen.  A urinalysis or evaluation of vaginal discharge.  Blood tests. HOME CARE INSTRUCTIONS   Only take over-the-counter or prescription medicines for pain, discomfort, or fever as directed by your caregiver.   Rest as directed by your caregiver.   Eat a balanced diet.   Drink enough fluids to make your urine clear or pale yellow, or as directed.   Avoid sexual intercourse if it causes pain.   Apply warm or cold compresses to the lower abdomen depending on which one helps the pain.   Avoid stressful situations.   Keep a journal of your pelvic pain. Write down when it started, where the pain is located, and if there are things that seem to be associated with the pain, such as food or your menstrual cycle.  Follow up with your caregiver as directed.  SEEK MEDICAL CARE IF:  Your medicine does not help your pain.  You have abnormal vaginal discharge. SEEK IMMEDIATE MEDICAL CARE IF:   You have heavy bleeding from the vagina.   Your pelvic pain increases.   You feel light-headed or faint.   You have chills.   You have pain with urination or blood in your urine.   You have uncontrolled diarrhea   or vomiting.   You have a fever or persistent symptoms for more than 3 days.  You have a fever and your symptoms suddenly get worse.   You are being physically or sexually abused.  MAKE SURE YOU:  Understand these instructions.  Will watch your condition.  Will get help if you are not doing well or get worse. Document Released:  08/10/2004 Document Revised: 01/28/2014 Document Reviewed: 01/03/2012 [employer] Patient Information 2015 [employer]. This information is not intended to replace advice given to you by your health care provider. Make sure you discuss any questions you have with your health care provider.  

## 2015-05-20 NOTE — ED Notes (Signed)
Pt called out with c/o HA; given ice pack to place across her forehead and instructed to notify RN if ineffective.

## 2015-05-20 NOTE — ED Provider Notes (Signed)
[facility] Emergency Department Provider Note  ____________________________________________  Time seen: Approximately 5:12 AM  I have reviewed the triage vital signs and the nursing notes.   HISTORY  Chief Complaint Abdominal Pain    HPI Mandy Hansen is a 24 y.o. female who comes in with abdominal pain. The patient reports that the pain started 3 hours prior to her arrival. The patient reports that she has an OB appointment tomorrow but that this pain was so severe she could not wait. She reports that the pain feels the same as it did when she was seen on 811. She feels this pain around her belly button. The patient took some Vicoprofen for pain did not help. She denies any fevers but has had vomiting and nausea with no diarrhea. The patient reports he has not been able to get in to see her OB until this appointment that is coming up. When she was last seen she was prescribed Percocet and reports that she has completed that course. She reports that they want to look into her abdomen to evaluate her for possible endometriosis. The patient last had a period 3 years ago and is currently on Depo-Provera.   History reviewed. No pertinent past medical history.  There are no active problems to display for this patient.   Past Surgical History  Procedure Laterality Date  . Cesarean section      Current Outpatient Rx  Name  Route  Sig  Dispense  Refill  . acetaminophen (TYLENOL) 325 MG tablet   Oral   Take 650 mg by mouth every 4 (four) hours as needed for mild pain, moderate pain, fever or headache.          . ibuprofen (ADVIL,MOTRIN) 200 MG tablet   Oral   Take 800 mg by mouth every 6 (six) hours as needed for fever, headache, mild pain, moderate pain or cramping.          . medroxyPROGESTERone (DEPO-PROVERA) 150 MG/ML injection   Intramuscular   Inject 1 mL into the muscle every 3 (three) months.         . potassium chloride (MICRO-K) 10 MEQ CR  capsule   Oral   Take 1 capsule by mouth 2 (two) times daily.           Allergies Review of patient's allergies indicates no known allergies.  No family history on file.  Social History Social History  Substance Use Topics  . Smoking status: Current Every Day Smoker  . Smokeless tobacco: None  . Alcohol Use: No    Review of Systems Constitutional: No fever/chills Eyes: No visual changes. ENT: No sore throat. Cardiovascular: Denies chest pain. Respiratory: Denies shortness of breath. Gastrointestinal: abdominal pain.   Nausea, vomiting.  No diarrhea.  No constipation. Genitourinary: Negative for dysuria. Musculoskeletal: Negative for back pain. Skin: Negative for rash. Neurological: Negative for headaches, focal weakness or numbness.  10-point ROS otherwise negative.  ____________________________________________   PHYSICAL EXAM:  VITAL SIGNS: ED Triage Vitals  Enc Vitals Group     BP 05/20/15 0452 136/82 mmHg     Pulse Rate 05/20/15 0452 116     Resp 05/20/15 0452 18     Temp 05/20/15 0452 97.9 F (36.6 C)     Temp Source 05/20/15 0452 Oral     SpO2 05/20/15 0452 97 %     Weight 05/20/15 0452 140 lb (63.504 kg)     Height 05/20/15 0452  (1.6 m)  Head Cir --      Peak Flow --      Pain Score 05/20/15 0452 10     Pain Loc --      Pain Edu? --      Excl. in GC? --     Constitutional: Alert and oriented. Well appearing and in moderate distress. Eyes: Conjunctivae are normal. PERRL. EOMI. Head: Atraumatic. Nose: No congestion/rhinnorhea. Mouth/Throat: Mucous membranes are moist.  Oropharynx non-erythematous. Cardiovascular: Normal rate, regular rhythm. Grossly normal heart sounds.  Good peripheral circulation. Respiratory: Normal respiratory effort.  No retractions. Lungs CTAB. Gastrointestinal: Soft and midline abdominal tenderness to palpation. No distention. Increased bowel sounds Musculoskeletal: No lower extremity tenderness nor edema.    Neurologic:  Normal speech and language.  Skin:  Skin is warm, dry and intact.  Psychiatric: Mood and affect are normal.   ____________________________________________   LABS (all labs ordered are listed, but only abnormal results are displayed)  Labs Reviewed  CBC - Abnormal; Notable for the following:    WBC 12.8 (*)    All other components within normal limits  COMPREHENSIVE METABOLIC PANEL   ____________________________________________  EKG  none ____________________________________________  RADIOLOGY  none ____________________________________________   PROCEDURES  Procedure(s) performed: None  Critical Care performed: No  ____________________________________________   INITIAL IMPRESSION / ASSESSMENT AND PLAN / ED COURSE  Pertinent labs & imaging results that were available during my care of the patient were reviewed by me and considered in my medical decision making (see chart for details).  The patient's 24 year old female who has a history of abdominal pain and comes in with worsened abdominal pain this morning. The patient had a CT scan done on 05/08/2015 which showed pelvic congestion syndrome but no other acute abnormalities. I will do some blood work and give the patient some morphine and Zofran. I will also give the patient liter of normal saline and then reassess her. At this time I will not do any imaging as the patient has been evaluated previously for this pain and I do not feel that it will further aid in diagnosis. I will reassess the patient when she's receive her medications.  The patient reports that her pain is better but is now complaining of pain in her leg and headache. The patient will receive some fioricet and tramadol and have an ultrasound done of her leg.   The patient's care will be signed out to Dr. Mayford Knife who will follow up the result of the ultrasound and reassess the patient.  ____________________________________________   FINAL  CLINICAL IMPRESSION(S) / ED DIAGNOSES  Final diagnoses:  Periumbilical abdominal pain  Pain of left lower extremity      Rebecka Apley, MD 05/20/15 [phone]

## 2015-05-20 NOTE — ED Notes (Addendum)
Pt to triage via w/c, tearful, c/o mid lower abd pain; st seen here recently for same and dx with pelvic congestion r/t endometriosis; pt reports has appt Wed with OB/GYN

## 2015-05-21 ENCOUNTER — Encounter
Admission: RE | Admit: 2015-05-21 | Discharge: 2015-05-21 | Disposition: A | Discharge status: Home / Self Care | Payer: Medicaid Other | Source: Ambulatory Visit | Attending: Obstetrics & Gynecology | Admitting: Obstetrics & Gynecology

## 2015-05-21 DIAGNOSIS — Z01818 Encounter for other preprocedural examination: Secondary | ICD-10-CM | POA: Diagnosis present

## 2015-05-21 DIAGNOSIS — R109 Unspecified abdominal pain: Secondary | ICD-10-CM | POA: Insufficient documentation

## 2015-05-21 HISTORY — DX: Psoriasis, unspecified: L40.9

## 2015-05-21 HISTORY — DX: Dysplasia of cervix uteri, unspecified: N87.9

## 2015-05-21 LAB — TYPE AND SCREEN
ABO/RH(D): A POS
ANTIBODY SCREEN: NEGATIVE

## 2015-05-21 NOTE — Patient Instructions (Signed)
  Your procedure is scheduled on: Friday May 23, 2015. Report to Same Day Surgery. To find out your arrival time please call [phone] between 1PM - 3PM on Thursday May 22, 2015.  Remember: Instructions that are not followed completely may result in serious medical risk, up to and including death, or upon the discretion of your surgeon and anesthesiologist your surgery may need to be rescheduled.    __x__ 1. Do not eat food or drink liquids after midnight. No gum chewing or hard candies.     ____ 2. No Alcohol for 24 hours before or after surgery.   ____ 3. Bring all medications with you on the day of surgery if instructed.    __x__ 4. Notify your doctor if there is any change in your medical condition     (cold, fever, infections).     Do not wear jewelry, make-up, hairpins, clips or nail polish.  Do not wear lotions, powders, or perfumes. You may wear deodorant.  Do not shave 48 hours prior to surgery. Men may shave face and neck.  Do not bring valuables to the hospital.    [facility] is not responsible for any belongings or valuables.               Contacts, dentures or bridgework may not be worn into surgery.  Leave your suitcase in the car. After surgery it may be brought to your room.  For patients admitted to the hospital, discharge time is determined by your treatment team.   Patients discharged the day of surgery will not be allowed to drive home.    Please read over the following fact sheets that you were given:   [facility] Preparing for Surgery  ____ Take these medicines the morning of surgery with A SIP OF WATER: NONE     ____ Fleet Enema (as directed)   __x__ Use CHG Soap as directed  ____ Use inhalers on the day of surgery  ____ Stop metformin 2 days prior to surgery    ____ Take 1/2 of usual insulin dose the night before surgery and none on the morning of surgery.   ____ Stop Coumadin/Plavix/aspirin on does not apply.  _x___ Stop  Anti-inflammatories Ibuprofen now.  Ok to take Tylenol or Oxycodone with tylenol.   ____ Stop supplements until after surgery.    ____ Bring C-Pap to the hospital.

## 2015-05-21 NOTE — Pre-Procedure Instructions (Signed)
Pt just had blood drawn in ED yesterday 05/20/15.  Preop orders included hemaglobin(hgb) and White Blood Cell(WBC).  [facility] does not have a separate test just for WBC, a CBC would need to be ordered, it would include both the HGB and WBC. Spoke with Raynelle Fanning at [facility] regarding above and if yesterdays results could be used instead of drawning more blood for above test.

## 2015-05-22 LAB — ABO/RH: ABO/RH(D): A POS

## 2015-05-22 NOTE — Pre-Procedure Instructions (Signed)
Yesterday afternoon received a call from Dr. Tiburcio Pea' office saying we could use the CBC results from 05/20/15 and not to run the HGB and WBC drawn on 05/21/15.  Epic program was down yesterday afternoon, so this RN was unable document this not until this am.

## 2015-05-23 ENCOUNTER — Ambulatory Visit: Payer: Medicaid Other | Admitting: Certified Registered Nurse Anesthetist

## 2015-05-23 ENCOUNTER — Encounter
Admission: RE | Disposition: A | Discharge status: Home / Self Care | Payer: Self-pay | Source: Ambulatory Visit | Attending: Obstetrics & Gynecology

## 2015-05-23 ENCOUNTER — Ambulatory Visit
Admission: RE | Admit: 2015-05-23 | Discharge: 2015-05-23 | Disposition: A | Discharge status: Home / Self Care | Payer: Medicaid Other | Source: Ambulatory Visit | Attending: Obstetrics & Gynecology | Admitting: Obstetrics & Gynecology

## 2015-05-23 DIAGNOSIS — Z79899 Other long term (current) drug therapy: Secondary | ICD-10-CM | POA: Insufficient documentation

## 2015-05-23 DIAGNOSIS — N736 Female pelvic peritoneal adhesions (postinfective): Secondary | ICD-10-CM | POA: Diagnosis not present

## 2015-05-23 DIAGNOSIS — Z8049 Family history of malignant neoplasm of other genital organs: Secondary | ICD-10-CM | POA: Insufficient documentation

## 2015-05-23 DIAGNOSIS — Z791 Long term (current) use of non-steroidal anti-inflammatories (NSAID): Secondary | ICD-10-CM | POA: Diagnosis not present

## 2015-05-23 DIAGNOSIS — Z79891 Long term (current) use of opiate analgesic: Secondary | ICD-10-CM | POA: Diagnosis not present

## 2015-05-23 DIAGNOSIS — Z793 Long term (current) use of hormonal contraceptives: Secondary | ICD-10-CM | POA: Diagnosis not present

## 2015-05-23 DIAGNOSIS — R102 Pelvic and perineal pain: Secondary | ICD-10-CM | POA: Diagnosis present

## 2015-05-23 HISTORY — PX: LAPAROSCOPY: SHX197

## 2015-05-23 HISTORY — PX: CYSTOSCOPY: SHX5120

## 2015-05-23 LAB — POCT PREGNANCY, URINE: Preg Test, Ur: NEGATIVE

## 2015-05-23 SURGERY — LAPAROSCOPY, DIAGNOSTIC
Anesthesia: General | Site: Bladder | Wound class: Clean Contaminated

## 2015-05-23 MED ORDER — LIDOCAINE HCL (CARDIAC) 20 MG/ML IV SOLN
INTRAVENOUS | Status: DC | PRN
  Administered 2015-05-23: 60 mg via INTRAVENOUS

## 2015-05-23 MED ORDER — OXYCODONE-ACETAMINOPHEN 5-325 MG PO TABS
ORAL_TABLET | ORAL | Status: AC
  Filled 2015-05-23: qty 1

## 2015-05-23 MED ORDER — FAMOTIDINE 20 MG PO TABS
ORAL_TABLET | ORAL | Status: AC
  Filled 2015-05-23: qty 1

## 2015-05-23 MED ORDER — GLYCOPYRROLATE 0.2 MG/ML IJ SOLN
INTRAMUSCULAR | Status: DC | PRN
Start: 2015-05-23 — End: 2015-05-23
  Administered 2015-05-23: 0.6 mg via INTRAVENOUS

## 2015-05-23 MED ORDER — KETOROLAC TROMETHAMINE 30 MG/ML IJ SOLN
INTRAMUSCULAR | Status: DC | PRN
  Administered 2015-05-23: 30 mg via INTRAVENOUS

## 2015-05-23 MED ORDER — FAMOTIDINE 20 MG PO TABS
20.0000 mg | ORAL_TABLET | Freq: Once | ORAL | Status: AC
  Administered 2015-05-23: 20 mg via ORAL

## 2015-05-23 MED ORDER — METOCLOPRAMIDE HCL 5 MG/ML IJ SOLN
10.0000 mg | Freq: Once | INTRAMUSCULAR | Status: AC | PRN
  Administered 2015-05-23: 10 mg via INTRAVENOUS

## 2015-05-23 MED ORDER — BUPIVACAINE HCL 0.5 % IJ SOLN
INTRAMUSCULAR | Status: DC | PRN
  Administered 2015-05-23: 8 mL

## 2015-05-23 MED ORDER — DEXAMETHASONE SODIUM PHOSPHATE 4 MG/ML IJ SOLN
INTRAMUSCULAR | Status: DC | PRN
  Administered 2015-05-23: 8 mg via INTRAVENOUS

## 2015-05-23 MED ORDER — LACTATED RINGERS IV SOLN
INTRAVENOUS | Status: DC
  Administered 2015-05-23: 10:00:00 via INTRAVENOUS

## 2015-05-23 MED ORDER — ONDANSETRON HCL 4 MG/2ML IJ SOLN
INTRAMUSCULAR | Status: DC | PRN
  Administered 2015-05-23: 4 mg via INTRAVENOUS

## 2015-05-23 MED ORDER — HYDROMORPHONE HCL 1 MG/ML IJ SOLN
INTRAMUSCULAR | Status: AC
  Administered 2015-05-23: 0.5 mg via INTRAVENOUS
  Filled 2015-05-23: qty 1

## 2015-05-23 MED ORDER — OXYCODONE-ACETAMINOPHEN 5-325 MG PO TABS: 1.0000 | ORAL_TABLET | ORAL | Status: DC | PRN

## 2015-05-23 MED ORDER — FENTANYL CITRATE (PF) 100 MCG/2ML IJ SOLN
INTRAMUSCULAR | Status: DC | PRN
  Administered 2015-05-23: 50 ug via INTRAVENOUS
  Administered 2015-05-23 (×2): 100 ug via INTRAVENOUS

## 2015-05-23 MED ORDER — METOCLOPRAMIDE HCL 5 MG/ML IJ SOLN
INTRAMUSCULAR | Status: AC
  Filled 2015-05-23: qty 2

## 2015-05-23 MED ORDER — OXYCODONE-ACETAMINOPHEN 5-325 MG PO TABS
1.0000 | ORAL_TABLET | ORAL | Status: DC | PRN
  Administered 2015-05-23: 1 via ORAL

## 2015-05-23 MED ORDER — MIDAZOLAM HCL 2 MG/2ML IJ SOLN
INTRAMUSCULAR | Status: DC | PRN
  Administered 2015-05-23: 2 mg via INTRAVENOUS

## 2015-05-23 MED ORDER — ROCURONIUM BROMIDE 100 MG/10ML IV SOLN
INTRAVENOUS | Status: DC | PRN
  Administered 2015-05-23: 10 mg via INTRAVENOUS
  Administered 2015-05-23: 30 mg via INTRAVENOUS

## 2015-05-23 MED ORDER — NEOSTIGMINE METHYLSULFATE 10 MG/10ML IV SOLN
INTRAVENOUS | Status: DC | PRN
  Administered 2015-05-23: 4 mg via INTRAVENOUS

## 2015-05-23 MED ORDER — PROPOFOL 10 MG/ML IV BOLUS
INTRAVENOUS | Status: DC | PRN
  Administered 2015-05-23: 50 mg via INTRAVENOUS
  Administered 2015-05-23: 150 mg via INTRAVENOUS

## 2015-05-23 MED ORDER — HYDROMORPHONE HCL 1 MG/ML IJ SOLN
0.2500 mg | INTRAMUSCULAR | Status: DC | PRN
  Administered 2015-05-23: 0.5 mg via INTRAVENOUS
  Administered 2015-05-23: 0.25 mg via INTRAVENOUS
  Administered 2015-05-23: 0.5 mg via INTRAVENOUS
  Administered 2015-05-23: 0.25 mg via INTRAVENOUS
  Administered 2015-05-23: 0.5 mg via INTRAVENOUS

## 2015-05-23 SURGICAL SUPPLY — 44 items
APPLICATOR ARISTA FLEXITIP XL (MISCELLANEOUS) ×4 IMPLANT
BAG COUNTER SPONGE EZ (MISCELLANEOUS) IMPLANT
BAG URO DRAIN 2000ML W/SPOUT (MISCELLANEOUS) IMPLANT
BARRIER ADHS 3X4 INTERCEED (GAUZE/BANDAGES/DRESSINGS) ×4 IMPLANT
BLADE SURG SZ11 CARB STEEL (BLADE) ×4 IMPLANT
CANISTER SUCT 1200ML W/VALVE (MISCELLANEOUS) ×4 IMPLANT
CATH FOLEY 2WAY  5CC 16FR (CATHETERS)
CATH ROBINSON RED A/P 16FR (CATHETERS) ×4 IMPLANT
CATH URTH 16FR FL 2W BLN LF (CATHETERS) IMPLANT
CHLORAPREP W/TINT 26ML (MISCELLANEOUS) ×4 IMPLANT
COUNTER SPONGE BAG EZ (MISCELLANEOUS)
DRAPE UNDER BUTTOCK W/FLU (DRAPES) ×4 IMPLANT
DRSG TEGADERM 2-3/8X2-3/4 SM (GAUZE/BANDAGES/DRESSINGS) ×8 IMPLANT
GAUZE SPONGE NON-WVN 2X2 STRL (MISCELLANEOUS) ×4 IMPLANT
GLOVE BIO SURGEON STRL SZ8 (GLOVE) ×4 IMPLANT
GLOVE INDICATOR 8.0 STRL GRN (GLOVE) ×4 IMPLANT
GOWN STRL REUS W/ TWL LRG LVL3 (GOWN DISPOSABLE) ×2 IMPLANT
GOWN STRL REUS W/ TWL XL LVL3 (GOWN DISPOSABLE) ×2 IMPLANT
GOWN STRL REUS W/TWL LRG LVL3 (GOWN DISPOSABLE) ×2
GOWN STRL REUS W/TWL XL LVL3 (GOWN DISPOSABLE) ×2
HEMOSTAT ARISTA ABSORB 1G (Miscellaneous) ×4 IMPLANT
IRRIGATION STRYKERFLOW (MISCELLANEOUS) ×2 IMPLANT
IRRIGATOR STRYKERFLOW (MISCELLANEOUS) ×4
IV LACTATED RINGERS 1000ML (IV SOLUTION) ×4 IMPLANT
LABEL OR SOLS (LABEL) ×4 IMPLANT
LIQUID BAND (GAUZE/BANDAGES/DRESSINGS) ×4 IMPLANT
NEEDLE VERESS 14GA 120MM (NEEDLE) ×4 IMPLANT
NS IRRIG 500ML POUR BTL (IV SOLUTION) ×4 IMPLANT
PACK GYN LAPAROSCOPIC (MISCELLANEOUS) ×4 IMPLANT
PAD OB MATERNITY 4.3X12.25 (PERSONAL CARE ITEMS) ×4 IMPLANT
PAD PREP 24X41 OB/GYN DISP (PERSONAL CARE ITEMS) ×4 IMPLANT
SCISSORS METZENBAUM CVD 33 (INSTRUMENTS) IMPLANT
SET CYSTO W/LG BORE CLAMP LF (SET/KITS/TRAYS/PACK) ×4 IMPLANT
SHEARS HARMONIC ACE PLUS 36CM (ENDOMECHANICALS) ×4 IMPLANT
SLEEVE ENDOPATH XCEL 5M (ENDOMECHANICALS) ×8 IMPLANT
SPONGE VERSALON 2X2 STRL (MISCELLANEOUS) ×4
SPONGE XRAY 4X4 16PLY STRL (MISCELLANEOUS) ×4 IMPLANT
SURGILUBE 2OZ TUBE FLIPTOP (MISCELLANEOUS) ×4 IMPLANT
SUT VIC AB 2-0 UR6 27 (SUTURE) IMPLANT
SUT VIC AB 4-0 PS2 18 (SUTURE) ×4 IMPLANT
SYRINGE 10CC LL (SYRINGE) ×8 IMPLANT
TROCAR ENDO BLADELESS 11MM (ENDOMECHANICALS) IMPLANT
TROCAR XCEL NON-BLD 5MMX100MML (ENDOMECHANICALS) IMPLANT
TUBING INSUFFLATOR HI FLOW (MISCELLANEOUS) ×4 IMPLANT

## 2015-05-23 NOTE — Transfer of Care (Signed)
Immediate Anesthesia Transfer of Care Note  Patient: Mandy Hansen  Procedure(s) Performed: Procedure(s): LAPAROSCOPY DIAGNOSTIC (N/A) CYSTOSCOPY (N/A)  Patient Location: PACU  Anesthesia Type:General  Level of Consciousness: sedated  Airway & Oxygen Therapy: Patient Spontanous Breathing and Patient connected to face mask oxygen  Post-op Assessment: Report given to RN and Post -op Vital signs reviewed and stable  Post vital signs: Reviewed and stable  Last Vitals:  Filed Vitals:   05/23/15 1153  BP: 143/88  Pulse: 93  Temp: 36.6 C  Resp: 20    Complications: No apparent anesthesia complications

## 2015-05-23 NOTE — Discharge Instructions (Signed)
General Gynecological Post-Operative Instructions °You may expect to feel dizzy, weak, and drowsy for as long as 24 hours after receiving the medicine that made you sleep (anesthetic).  °Do not drive a car, ride a bicycle, participate in physical activities, or take public transportation until you are done taking narcotic pain medicines or as directed by your doctor.  °Do not drink alcohol or take tranquilizers.  °Do not take medicine that has not been prescribed by your doctor.  °Do not sign important papers or make important decisions while on narcotic pain medicines.  °Have a responsible person with you.  °CARE OF INCISION  °Keep incision clean and dry. °Take showers instead of baths until your doctor gives you permission to take baths.  °Avoid heavy lifting (more than 10 pounds/4.5 kilograms), pushing, or pulling.  °Avoid activities that may risk injury to your surgical site.  °No sexual intercourse or placement of anything in the vagina for 1 weeks or as instructed by your doctor. °If you have tubes coming from the wound site, check with your doctor regarding appropriate care of the tubes. °Only take prescription or over-the-counter medicines  for pain, discomfort, or fever as directed by your doctor. Do not take aspirin. It can make you bleed. Take medicines (antibiotics) that kill germs if they are prescribed for you.  °Call the office or go to the ER if:  °You feel sick to your stomach (nauseous) and you start to throw up (vomit).  °You have trouble eating or drinking.  °You have an oral temperature above 101.  °You have constipation that is not helped by adjusting diet or increasing fluid intake. Pain medicines are a common cause of constipation.  °You have any other concerns. °SEEK IMMEDIATE MEDICAL CARE IF:  °You have persistent dizziness.  °You have difficulty breathing or a congested sounding (croupy) cough.  °You have an oral temperature above 102.5, not controlled by medicine.  °There is increasing  pain or tenderness near or in the surgical site.  ° ° ° °

## 2015-05-23 NOTE — H&P (Signed)
History and Physical Interval Note:  05/23/2015 9:45 AM  Mandy Hansen  has presented today for surgery, with the diagnosis of ACUTE ABDOMINAL PAIN  The various methods of treatment have been discussed with the patient and family. After consideration of risks, benefits and other options for treatment, the patient has consented to  Procedure(s): LAPAROSCOPY DIAGNOSTIC (N/A) CYSTOSCOPY (N/A) as a surgical intervention .  The patient's history has been reviewed, patient examined, no change in status, stable for surgery.  Pt has the following beta blocker history-  Not taking Beta Blocker.  I have reviewed the patient's chart and labs.  Questions were answered to the patient's satisfaction.    Angelique Chevalier Renae Fickle

## 2015-05-23 NOTE — Anesthesia Preprocedure Evaluation (Addendum)
Anesthesia Evaluation  Patient identified by MRN, date of birth, ID band Patient awake    Reviewed: Allergy & Precautions, NPO status , Patient's Chart, lab work & pertinent test results  Airway Mallampati: I  TM Distance: >3 FB Neck ROM: Full    Dental  (+) Teeth Intact   Pulmonary Current Smoker,    Pulmonary exam normal       Cardiovascular Exercise Tolerance: Good Normal cardiovascular exam    Neuro/Psych    GI/Hepatic negative GI ROS, Neg liver ROS,   Endo/Other  negative endocrine ROS  Renal/GU negative Renal ROS  negative genitourinary   Musculoskeletal negative musculoskeletal ROS (+)   Abdominal   Peds  Hematology negative hematology ROS (+)   Anesthesia Other Findings   Reproductive/Obstetrics                            Anesthesia Physical Anesthesia Plan  ASA: II  Anesthesia Plan: General   Post-op Pain Management:    Induction: Intravenous  Airway Management Planned: Oral ETT  Additional Equipment:   Intra-op Plan:   Post-operative Plan: Extubation in OR  Informed Consent: I have reviewed the patients History and Physical, chart, labs and discussed the procedure including the risks, benefits and alternatives for the proposed anesthesia with the patient or authorized representative who has indicated his/her understanding and acceptance.     Plan Discussed with: CRNA  Anesthesia Plan Comments:         Anesthesia Quick Evaluation

## 2015-05-23 NOTE — Op Note (Signed)
  Operative Note   05/23/2015  PRE-OP DIAGNOSIS: Pelvic pain     POST-OP DIAGNOSIS: same, adhesive disease (severe)   PROCEDURE: Procedure(s): LAPAROSCOPY DIAGNOSTIC CYSTOSCOPY   SURGEON: Annamarie Major, MD, FACOG  ANESTHESIA: Choice   ESTIMATED BLOOD LOSS: min  COMPLICATIONS: none  DISPOSITION: PACU - hemodynamically stable.  CONDITION: stable  FINDINGS: Laparoscopic survey of the abdomen revealed a grossly normal tubes, ovaries, liver edge, gallbladder edge and appendix, SEVERE intra-abdominal adhesions were noted including UTERUS TO ANT ABDOMINAL WALL.   PROCEDURE IN DETAIL: The patient was taken to the OR where anesthesia was administed. The patient was positioned in dorsal lithotomy in the Coleman stirrups. The patient was then examined under anesthesia with the above noted findings. The patient was prepped and draped in the normal sterile fashion and foley catheter was placed. A Graves speculum was placed in the vagina and the anterior lip of the cervix was grasped with a single toothed tenaculum. A Hulka uterine manipulator was then inserted in the uterus. Uterine mobility was found to be satisfactory. The speculum was then removed.  Attention was turned to the patient's abdomen where a 5 mm skin incision was made in the umbilical fold, after injection of local anesthesia. The Veress step needle was carefully introduced into the peritoneal cavity with placement confirmed using the hanging drop technique.  Pneumoperitoneum was obtained. The 5 mm port was then placed under direct visualization with the operative laparoscope.  Trendelenburg positioning.  Additional 5 mm trocar was then placed in the RLQ lateral to the inferior epigastric blood vessels under direct visualization with the laparoscope.  Instrumentation to visualize complete pelvic anatomy performed.    Adhesions were immediatly noted with the anterior abdominal wall to omentum and uterus.  Bladder appears to not be  involved.  Using the harmonic scapel, lysis of adhesions is meticulously done to free the omentum and uterus from the abdominal wall.  No injuries or perforations are encountered.  Hemostasis is achieved w bipolar and Arista.  Interceed is placed over adhesion site to minimize recurrence of adhesions.  Cystoscopy is performed w saline distension of bladder and no injuries or ulcerations or glomerulations noted.  Instruments and trocars removed, gas expelled, and skin closed with skin adhesive glue.  Instrument, needle, and sponge counts correct x2 at the conclusion of the case.  Pt goes to recovery room in stable condition.

## 2015-05-23 NOTE — Anesthesia Postprocedure Evaluation (Signed)
  Anesthesia Post-op Note  Patient: Mandy Hansen  Procedure(s) Performed: Procedure(s): LAPAROSCOPY DIAGNOSTIC (N/A) CYSTOSCOPY (N/A)  Anesthesia type:General  Patient location: PACU  Post pain: Pain level controlled  Post assessment: Post-op Vital signs reviewed, Patient's Cardiovascular Status Stable, Respiratory Function Stable, Patent Airway and No signs of Nausea or vomiting  Post vital signs: Reviewed and stable  Last Vitals:  Filed Vitals:   05/23/15 1219  BP:   Pulse: 64  Temp:   Resp: 14    Level of consciousness: awake, alert  and patient cooperative  Complications: No apparent anesthesia complications

## 2015-06-30 ENCOUNTER — Encounter: Payer: Self-pay | Admitting: Emergency Medicine

## 2015-06-30 ENCOUNTER — Emergency Department: Payer: Medicaid Other

## 2015-06-30 ENCOUNTER — Observation Stay
Admission: EM | Admit: 2015-06-30 | Discharge: 2015-07-02 | Disposition: A | Discharge status: Home / Self Care | Payer: Medicaid Other | Attending: Internal Medicine | Admitting: Internal Medicine

## 2015-06-30 DIAGNOSIS — F17210 Nicotine dependence, cigarettes, uncomplicated: Secondary | ICD-10-CM | POA: Diagnosis not present

## 2015-06-30 DIAGNOSIS — R52 Pain, unspecified: Secondary | ICD-10-CM

## 2015-06-30 DIAGNOSIS — R109 Unspecified abdominal pain: Secondary | ICD-10-CM | POA: Diagnosis present

## 2015-06-30 DIAGNOSIS — R1011 Right upper quadrant pain: Secondary | ICD-10-CM | POA: Insufficient documentation

## 2015-06-30 DIAGNOSIS — R1013 Epigastric pain: Secondary | ICD-10-CM | POA: Diagnosis present

## 2015-06-30 DIAGNOSIS — R110 Nausea: Secondary | ICD-10-CM | POA: Diagnosis not present

## 2015-06-30 DIAGNOSIS — K210 Gastro-esophageal reflux disease with esophagitis: Principal | ICD-10-CM | POA: Insufficient documentation

## 2015-06-30 DIAGNOSIS — R079 Chest pain, unspecified: Secondary | ICD-10-CM | POA: Diagnosis not present

## 2015-06-30 DIAGNOSIS — K381 Appendicular concretions: Secondary | ICD-10-CM | POA: Insufficient documentation

## 2015-06-30 DIAGNOSIS — L409 Psoriasis, unspecified: Secondary | ICD-10-CM | POA: Diagnosis not present

## 2015-06-30 DIAGNOSIS — K319 Disease of stomach and duodenum, unspecified: Secondary | ICD-10-CM | POA: Diagnosis not present

## 2015-06-30 DIAGNOSIS — K5900 Constipation, unspecified: Secondary | ICD-10-CM | POA: Diagnosis not present

## 2015-06-30 LAB — URINALYSIS COMPLETE WITH MICROSCOPIC (ARMC ONLY)
Bilirubin Urine: NEGATIVE
GLUCOSE, UA: NEGATIVE mg/dL
Hgb urine dipstick: NEGATIVE
Ketones, ur: NEGATIVE mg/dL
Leukocytes, UA: NEGATIVE
NITRITE: NEGATIVE
Protein, ur: NEGATIVE mg/dL
SPECIFIC GRAVITY, URINE: 1.008 (ref 1.005–1.030)
WBC, UA: NONE SEEN WBC/hpf (ref 0–5)
pH: 7 (ref 5.0–8.0)

## 2015-06-30 LAB — COMPREHENSIVE METABOLIC PANEL
ALBUMIN: 4.2 g/dL (ref 3.5–5.0)
ALK PHOS: 59 U/L (ref 38–126)
ALT: 18 U/L (ref 14–54)
ANION GAP: 7 (ref 5–15)
AST: 21 U/L (ref 15–41)
BILIRUBIN TOTAL: 0.3 mg/dL (ref 0.3–1.2)
BUN: 11 mg/dL (ref 6–20)
CALCIUM: 9 mg/dL (ref 8.9–10.3)
CO2: 24 mmol/L (ref 22–32)
Chloride: 104 mmol/L (ref 101–111)
Creatinine, Ser: 0.68 mg/dL (ref 0.44–1.00)
GFR calc non Af Amer: >60 mL/min (ref >60)
GLUCOSE: 86 mg/dL (ref 65–99)
Potassium: 4.8 mmol/L (ref 3.5–5.1)
Sodium: 135 mmol/L (ref 135–145)
TOTAL PROTEIN: 7 g/dL (ref 6.5–8.1)

## 2015-06-30 LAB — URINE DRUG SCREEN, QUALITATIVE (ARMC ONLY)
Amphetamines, Ur Screen: NOT DETECTED
BARBITURATES, UR SCREEN: NOT DETECTED
Benzodiazepine, Ur Scrn: NOT DETECTED
CANNABINOID 50 NG, UR ~~LOC~~: POSITIVE — AB
COCAINE METABOLITE, UR ~~LOC~~: NOT DETECTED
MDMA (ECSTASY) UR SCREEN: NOT DETECTED
Methadone Scn, Ur: NOT DETECTED
Opiate, Ur Screen: POSITIVE — AB
PHENCYCLIDINE (PCP) UR S: NOT DETECTED
TRICYCLIC, UR SCREEN: NOT DETECTED

## 2015-06-30 LAB — CBC
HEMATOCRIT: 42.7 % (ref 35.0–47.0)
HEMOGLOBIN: 14.1 g/dL (ref 12.0–16.0)
MCH: 30.6 pg (ref 26.0–34.0)
MCHC: 33 g/dL (ref 32.0–36.0)
MCV: 92.6 fL (ref 80.0–100.0)
Platelets: 227 10*3/uL (ref 150–440)
RBC: 4.61 MIL/uL (ref 3.80–5.20)
RDW: 13.2 % (ref 11.5–14.5)
WBC: 7.8 10*3/uL (ref 3.6–11.0)

## 2015-06-30 LAB — POCT PREGNANCY, URINE: PREG TEST UR: NEGATIVE

## 2015-06-30 LAB — LIPASE, BLOOD: Lipase: 37 U/L (ref 22–51)

## 2015-06-30 MED ORDER — PNEUMOCOCCAL VAC POLYVALENT 25 MCG/0.5ML IJ INJ: 0.5000 mL | INJECTION | INTRAMUSCULAR | Status: DC

## 2015-06-30 MED ORDER — INFLUENZA VAC SPLIT QUAD 0.5 ML IM SUSY: 0.5000 mL | PREFILLED_SYRINGE | INTRAMUSCULAR | Status: DC

## 2015-06-30 MED ORDER — OXYCODONE HCL 5 MG PO TABS
5.0000 mg | ORAL_TABLET | ORAL | Status: DC | PRN
  Administered 2015-06-30 – 2015-07-02 (×9): 5 mg via ORAL
  Filled 2015-06-30 (×9): qty 1

## 2015-06-30 MED ORDER — MORPHINE SULFATE (PF) 4 MG/ML IV SOLN
4.0000 mg | Freq: Once | INTRAVENOUS | Status: AC
  Administered 2015-06-30: 4 mg via INTRAVENOUS
  Filled 2015-06-30: qty 1

## 2015-06-30 MED ORDER — ACETAMINOPHEN 325 MG PO TABS
650.0000 mg | ORAL_TABLET | Freq: Four times a day (QID) | ORAL | Status: DC | PRN
  Administered 2015-06-30 – 2015-07-01 (×2): 650 mg via ORAL
  Filled 2015-06-30 (×2): qty 2

## 2015-06-30 MED ORDER — MORPHINE SULFATE (PF) 2 MG/ML IV SOLN
2.0000 mg | INTRAVENOUS | Status: DC | PRN
  Administered 2015-07-01 – 2015-07-02 (×2): 2 mg via INTRAVENOUS
  Filled 2015-06-30 (×2): qty 1

## 2015-06-30 MED ORDER — ONDANSETRON HCL 4 MG/2ML IJ SOLN
4.0000 mg | Freq: Once | INTRAMUSCULAR | Status: AC
  Administered 2015-06-30: 4 mg via INTRAVENOUS
  Filled 2015-06-30: qty 2

## 2015-06-30 MED ORDER — FERROUS SULFATE 325 (65 FE) MG PO TABS
325.0000 mg | ORAL_TABLET | Freq: Every day | ORAL | Status: DC
  Administered 2015-07-02: 325 mg via ORAL
  Filled 2015-06-30: qty 1

## 2015-06-30 MED ORDER — PANTOPRAZOLE SODIUM 40 MG IV SOLR
40.0000 mg | Freq: Two times a day (BID) | INTRAVENOUS | Status: DC
  Administered 2015-06-30 – 2015-07-02 (×5): 40 mg via INTRAVENOUS
  Filled 2015-06-30 (×5): qty 40

## 2015-06-30 MED ORDER — ACETAMINOPHEN 650 MG RE SUPP: 650.0000 mg | Freq: Four times a day (QID) | RECTAL | Status: DC | PRN

## 2015-06-30 MED ORDER — ONDANSETRON HCL 4 MG/2ML IJ SOLN
4.0000 mg | Freq: Four times a day (QID) | INTRAMUSCULAR | Status: DC | PRN
  Administered 2015-06-30 – 2015-07-02 (×5): 4 mg via INTRAVENOUS
  Filled 2015-06-30 (×5): qty 2

## 2015-06-30 MED ORDER — DOCUSATE SODIUM 100 MG PO CAPS
100.0000 mg | ORAL_CAPSULE | Freq: Two times a day (BID) | ORAL | Status: DC
  Administered 2015-06-30 – 2015-07-02 (×5): 100 mg via ORAL
  Filled 2015-06-30 (×5): qty 1

## 2015-06-30 NOTE — ED Provider Notes (Signed)
[facility] Emergency Department Provider Note  ____________________________________________  Time seen: 10:30 AM  I have reviewed the triage vital signs and the nursing notes.   HISTORY  Chief Complaint Abdominal Pain    HPI Mandy Hansen is a 24 y.o. female who woke up this morning with abdominal pain in the epigastrium which has made her very nauseous. She reports the pain is sharp and stabbing and constant and moderate in intensity. It does not travel anywhere. She denies vomiting. She is never had this before. No fevers no chills. Nothing makes it better and nothing makes it worse. She felt well last night.     Past Medical History  Diagnosis Date  . Dysplasia of cervix 2013    dyplasia has resolved since birth of son in 2013.  [name] Psoriasis of scalp 2004    mainly scalp, uses sun for treatment    There are no active problems to display for this patient.   Past Surgical History  Procedure Laterality Date  . Cesarean section    . Cesarean section N/A 2013  . Laparoscopy N/A 05/23/2015    Procedure: LAPAROSCOPY DIAGNOSTIC;  Surgeon: Nadara Mustard, MD;  Location: [facility] ORS;  Service: Gynecology;  Laterality: N/A;  . Cystoscopy N/A 05/23/2015    Procedure: CYSTOSCOPY;  Surgeon: Nadara Mustard, MD;  Location: [facility] ORS;  Service: Gynecology;  Laterality: N/A;    Current Outpatient Rx  Name  Route  Sig  Dispense  Refill  . Calcium Carb-Cholecalciferol (CALCIUM 600-D PO)   Oral   Take 1 tablet by mouth daily at 12 noon.         [name] ibuprofen (ADVIL,MOTRIN) 200 MG tablet   Oral   Take 800 mg by mouth every 6 (six) hours as needed for fever, headache, mild pain, moderate pain or cramping.          . Iron TABS   Oral   Take 1 tablet by mouth daily at 12 noon.         . medroxyPROGESTERone (DEPO-PROVERA) 150 MG/ML injection   Intramuscular   Inject 1 mL into the muscle every 3 (three) months.         [name] acetaminophen (TYLENOL) 325 MG  tablet   Oral   Take 650 mg by mouth every 4 (four) hours as needed for mild pain, moderate pain, fever or headache.            Allergies Review of patient's allergies indicates no known allergies.  History reviewed. No pertinent family history.  Social History Social History  Substance Use Topics  . Smoking status: Current Every Day Smoker -- 0.50 packs/day    Types: Cigarettes  . Smokeless tobacco: None  . Alcohol Use: No    Review of Systems  Constitutional: Negative for fever. Eyes: Negative for visual changes. ENT: Negative for sore throat Cardiovascular: Negative for chest pain. Respiratory: Negative for shortness of breath. Gastrointestinal: Positive for nausea, negative for diarrhea Genitourinary: Negative for dysuria. Musculoskeletal: Negative for back pain. Skin: Negative for rash. Neurological: Negative for headaches or focal weakness Psychiatric: Positive for anxiety    ____________________________________________   PHYSICAL EXAM:  VITAL SIGNS: ED Triage Vitals  Enc Vitals Group     BP 06/30/15 0903 129/71 mmHg     Pulse Rate 06/30/15 0903 102     Resp 06/30/15 0903 20     Temp 06/30/15 0903 98.3 F (36.8 C)     Temp Source 06/30/15 0903 Oral  SpO2 06/30/15 0903 99 %     Weight 06/30/15 0903 145 lb (65.772 kg)     Height 06/30/15 0903  (1.6 m)     Head Cir --      Peak Flow --      Pain Score 06/30/15 0904 7     Pain Loc --      Pain Edu? --      Excl. in GC? --      Constitutional: Alert and oriented. Well appearing and in no distress but anxious Eyes: Conjunctivae are normal.  ENT   Head: Normocephalic and atraumatic.   Mouth/Throat: Mucous membranes are moist. Cardiovascular: Normal rate, regular rhythm. Normal and symmetric distal pulses are present in all extremities. No murmurs, rubs, or gallops. Respiratory: Normal respiratory effort without tachypnea nor retractions. Breath sounds are clear and equal bilaterally.   Gastrointestinal: Mild tenderness to palpation right upper quadrant. No distention. There is no CVA tenderness. Genitourinary: deferred Musculoskeletal: Nontender with normal range of motion in all extremities. No lower extremity tenderness nor edema. Neurologic:  Normal speech and language. No gross focal neurologic deficits are appreciated. Skin:  Skin is warm, dry and intact. No rash noted. Psychiatric: Mood and affect are normal. Patient exhibits appropriate insight and judgment.  ____________________________________________    LABS (pertinent positives/negatives)  Labs Reviewed  CBC  COMPREHENSIVE METABOLIC PANEL  LIPASE, BLOOD  URINALYSIS COMPLETEWITH MICROSCOPIC ([facility] ONLY)    ____________________________________________   EKG  ED ECG REPORT I, Jene Every, the attending physician, personally viewed and interpreted this ECG.  Date: 06/30/2015 EKG Time: 9:17 AM Rate: 72 Rhythm: normal sinus rhythm QRS Axis: normal Intervals: normal ST/T Wave abnormalities: normal Conduction Disutrbances: none Narrative Interpretation: unremarkable   ____________________________________________    RADIOLOGY I have personally reviewed any xrays that were ordered on this patient: Ultrasound right upper quadrant is unremarkable CT abdomen pelvis is unremarkable  ____________________________________________   PROCEDURES  Procedure(s) performed: none  Critical Care performed: none  ____________________________________________   INITIAL IMPRESSION / ASSESSMENT AND PLAN / ED COURSE  Pertinent labs & imaging results that were available during my care of the patient were reviewed by me and considered in my medical decision making (see chart for details).  Patient with epigastric discomfort possibly more so on the right upper quadrant. We will obtain labs and an ultrasound of the gallbladder. Differential includes pancreatitis gastritis biliary colic  Ultrasound  unremarkable   CT unremarkable  Patient is to have pain despite multiple doses of IV narcotics. I discussed with the hospitalist for admission  ____________________________________________   FINAL CLINICAL IMPRESSION(S) / ED DIAGNOSES  Final diagnoses:  Abdominal pain     Jene Every, MD 06/30/15 1500

## 2015-06-30 NOTE — ED Notes (Signed)
Pt to ed with c/o upper abd pain since 4 am last night,  Pt denies diarrhea, and denies vomiting.

## 2015-06-30 NOTE — Consult Note (Signed)
Subjective: Patient seen for epigastric abdominal pain. Please see full GI consult by Ms. London. Patient seen and examined chart reviewed. Patient continuing with some epigastric pain though much less than this morning. No nausea or vomiting.  Objective: Vital signs in last 24 hours: Temp:  [98.3 F (36.8 C)] 98.3 F (36.8 C) (10/03 0903) Pulse Rate:  [68-102] 75 (10/03 1633) Resp:  [14-20] 14 (10/03 1330) BP: (94-129)/(56-71) 124/68 mmHg (10/03 1633) SpO2:  [86 %-99 %] 98 % (10/03 1633) Weight:  [65.772 kg (145 lb)] 65.772 kg (145 lb) (10/03 0903) Blood pressure 124/68, pulse 75, temperature 98.3 F (36.8 C), temperature source Oral, resp. rate 14, height  (1.6 m), weight 65.772 kg (145 lb), SpO2 98 %.   Intake/Output from previous day:    Intake/Output this shift:     General appearance:  Well-appearing 24 year old female no acute distress Resp:  Clear to auscultation Cardio:  Regular rate and rhythm GI:  Soft mild tenderness to palpation in the upper epigastric region. Bowel sounds are positive and normoactive. There is no rebound. Extremities:  No clubbing cyanosis or edema   Lab Results: Results for orders placed or performed during the hospital encounter of 06/30/15 (from the past 24 hour(s))  CBC     Status: None   Collection Time: 06/30/15 11:42 AM  Result Value Ref Range   WBC 7.8 3.6 - 11.0 K/uL   RBC 4.61 3.80 - 5.20 MIL/uL   Hemoglobin 14.1 12.0 - 16.0 g/dL   HCT 40.9 81.1 - 91.4 %   MCV 92.6 80.0 - 100.0 fL   MCH 30.6 26.0 - 34.0 pg   MCHC 33.0 32.0 - 36.0 g/dL   RDW 78.2 95.6 - 21.3 %   Platelets 227 150 - 440 K/uL  Comprehensive metabolic panel     Status: None   Collection Time: 06/30/15 11:42 AM  Result Value Ref Range   Sodium 135 135 - 145 mmol/L   Potassium 4.8 3.5 - 5.1 mmol/L   Chloride 104 101 - 111 mmol/L   CO2 24 22 - 32 mmol/L   Glucose, Bld 86 65 - 99 mg/dL   BUN 11 6 - 20 mg/dL   Creatinine, Ser 0.86 0.44 - 1.00 mg/dL   Calcium  9.0 8.9 - 57.8 mg/dL   Total Protein 7.0 6.5 - 8.1 g/dL   Albumin 4.2 3.5 - 5.0 g/dL   AST 21 15 - 41 U/L   ALT 18 14 - 54 U/L   Alkaline Phosphatase 59 38 - 126 U/L   Total Bilirubin 0.3 0.3 - 1.2 mg/dL   GFR calc non Af Amer >60 >60 mL/min   GFR calc Af Amer >60 >60 mL/min   Anion gap 7 5 - 15  Lipase, blood     Status: None   Collection Time: 06/30/15 11:42 AM  Result Value Ref Range   Lipase 37 22 - 51 U/L  Urinalysis complete, with microscopic ([facility] only)     Status: Abnormal   Collection Time: 06/30/15 11:43 AM  Result Value Ref Range   Color, Urine STRAW (A) YELLOW   APPearance CLEAR (A) CLEAR   Glucose, UA NEGATIVE NEGATIVE mg/dL   Bilirubin Urine NEGATIVE NEGATIVE   Ketones, ur NEGATIVE NEGATIVE mg/dL   Specific Gravity, Urine 1.008 1.005 - 1.030   Hgb urine dipstick NEGATIVE NEGATIVE   pH 7.0 5.0 - 8.0   Protein, ur NEGATIVE NEGATIVE mg/dL   Nitrite NEGATIVE NEGATIVE   Leukocytes, UA NEGATIVE NEGATIVE  RBC / HPF 0-5 0 - 5 RBC/hpf   WBC, UA NONE SEEN 0 - 5 WBC/hpf   Bacteria, UA RARE (A) NONE SEEN   Squamous Epithelial / LPF 0-5 (A) NONE SEEN   Mucous PRESENT   Pregnancy, urine POC     Status: None   Collection Time: 06/30/15 12:13 PM  Result Value Ref Range   Preg Test, Ur NEGATIVE NEGATIVE      Recent Labs  06/30/15 1142  WBC 7.8  HGB 14.1  HCT 42.7  PLT 227   BMET  Recent Labs  06/30/15 1142  NA 135  K 4.8  CL 104  CO2 24  GLUCOSE 86  BUN 11  CREATININE 0.68  CALCIUM 9.0   LFT  Recent Labs  06/30/15 1142  PROT 7.0  ALBUMIN 4.2  AST 21  ALT 18  ALKPHOS 59  BILITOT 0.3   PT/INR No results for input(s): LABPROT, INR in the last 72 hours. Hepatitis Panel No results for input(s): HEPBSAG, HCVAB, HEPAIGM, HEPBIGM in the last 72 hours. C-Diff No results for input(s): CDIFFTOX in the last 72 hours. No results for input(s): CDIFFPCR in the last 72 hours.   Studies/Results: Ct Renal Stone Study  06/30/2015   CLINICAL DATA:  Upper  abdominal pain since 4 a.m. this morning. She has laparoscopic surgery 2 months ago for uterine adhesion removal.  EXAM: CT ABDOMEN AND PELVIS WITHOUT CONTRAST  TECHNIQUE: Multidetector CT imaging of the abdomen and pelvis was performed following the standard protocol without IV contrast.  COMPARISON:  Right upper quadrant abdomen ultrasound obtained earlier today. Abdomen and pelvis CT dated 05/08/2015.  FINDINGS: Normal non contrasted appearance of the liver, spleen, pancreas, gallbladder, adrenal glands, kidneys, ureters, urinary bladder, uterus and ovaries. No gastrointestinal abnormalities or enlarged lymph nodes. Multiple small appendicoliths. The largest is a 4 mm proximal appendicolith. The appendix is normal in size with no periappendiceal soft tissue stranding. No enlarged lymph nodes. Clear lung bases. Normal appearing bones. Tiny umbilical hernia containing fat. Small pelvic phleboliths.  IMPRESSION: 1. No acute abnormality. 2. Multiple small appendicoliths without evidence of appendicitis. 3. Tiny umbilical hernia containing fat.   Electronically Signed   By: Beckie Salts M.D.   On: 06/30/2015 14:21   US Abdomen Limited Ruq  06/30/2015   CLINICAL DATA:  Abdominal pain and nausea since 4 a.m.  EXAM: US ABDOMEN LIMITED - RIGHT UPPER QUADRANT  COMPARISON:  05/08/2015 CT abdomen/pelvis.  FINDINGS: Gallbladder:  No gallstones or wall thickening visualized. No sonographic Murphy sign noted.  Common bile duct:  Diameter: 3 mm  Liver:  No focal lesion identified. Within normal limits in parenchymal echogenicity.  Other:  Patent main portal vein with appropriate flow direction. Incidentally noted is fullness of an extrarenal pelvis in the right kidney without right hydronephrosis, stable compared to the 05/08/2015 CT study.  IMPRESSION: Normal right upper quadrant abdominal sonogram, with no cholelithiasis.   Electronically Signed   By: Delbert Phenix M.D.   On: 06/30/2015 11:42    Scheduled Inpatient  Medications:   . docusate sodium  100 mg Oral BID  . [START ON 07/01/2015] ferrous sulfate  325 mg Oral Q1200  . [START ON 07/01/2015] Influenza vac split quadrivalent PF  0.5 mL Intramuscular Tomorrow-1000  . pantoprazole (PROTONIX) IV  40 mg Intravenous Q12H  . [START ON 07/01/2015] pneumococcal 23 valent vaccine  0.5 mL Intramuscular Tomorrow-1000    Continuous Inpatient Infusions:     PRN Inpatient Medications:  acetaminophen **OR**  acetaminophen, morphine injection, ondansetron (ZOFRAN) IV, oxyCODONE  Miscellaneous:   Assessment:  1. Epigastric pain. Long history of GERD since early teenage years. He has not taken any anti-reflux medications for about 3 years.  Plan:  1. We'll plan for EGD as clinically feasible tomorrow. I have discussed the risks benefits and complications of procedures to include not limited to bleeding, infection, perforation and the risk of sedation and the patient wishes to proceed.  Christena Deem MD 06/30/2015, 7:47 PM

## 2015-06-30 NOTE — Consult Note (Signed)
Consultation  Referring Provider:     Dr Argo Bing Admit date: 06/30/15 Consult date    06/30/15     Reason for Consultation:     Epigastric pain         HPI:   Mandy Hansen is a 24 y.o. female  Admitted with epigastric pain. Reports she has had GERD since she was, but has not found any effective medication for it, so drinks mild at night for symptom control with some benefit. States she was taking Ibuprofen  q4h until about a month ago. Had laprascopic gyn surgery for adhesions last August- states this relieved all her abdominal pain at that time and was doing well until last week. Reports having had some gastroenteritis last week with NV that lasted about 14h. Thought it was viral as her other family members had it as well. This resolved. Woke at 0400 this am with severe epigastric pain and nausea, but denies vomiting. States she has had no bowel habit changes, melena/hematochezia, swallowing issues or other GI complaints. There is no history of luminal evaluation. States putting pressure on her epigastrum makes it better. Some pain medication gave her short term relief.  Had noncontrasted CT that was unremarkable. Limited RUQ [place] unremarakble. CBC, metc, lipase normal.  Also had constrasted CT of the abdomen/pelvis 8/16 that did not demonstrate any acute GI abnormalities.     Past Medical History  Diagnosis Date  . Dysplasia of cervix 2013    dyplasia has resolved since birth of son in 2013.  [name] Psoriasis of scalp 2004    mainly scalp, uses sun for treatment    Past Surgical History  Procedure Laterality Date  . Cesarean section    . Cesarean section N/A 2013  . Laparoscopy N/A 05/23/2015    Procedure: LAPAROSCOPY DIAGNOSTIC;  Surgeon: Nadara Mustard, MD;  Location: [facility] ORS;  Service: Gynecology;  Laterality: N/A;  . Cystoscopy N/A 05/23/2015    Procedure: CYSTOSCOPY;  Surgeon: Nadara Mustard, MD;  Location: [facility] ORS;  Service: Gynecology;  Laterality: N/A;    History reviewed.  No pertinent family history.   Social History  Substance Use Topics  . Smoking status: Current Every Day Smoker -- 0.50 packs/day    Types: Cigarettes  . Smokeless tobacco: None  . Alcohol Use: No    Prior to Admission medications   Medication Sig Start Date End Date Taking? Authorizing Provider  Calcium Carb-Cholecalciferol (CALCIUM 600-D PO) Take 1 tablet by mouth daily at 12 noon.   Yes Historical Provider, MD  ibuprofen (ADVIL,MOTRIN) 200 MG tablet Take 800 mg by mouth every 6 (six) hours as needed for fever, headache, mild pain, moderate pain or cramping.    Yes Historical Provider, MD  Iron TABS Take 1 tablet by mouth daily at 12 noon.   Yes Historical Provider, MD  medroxyPROGESTERone (DEPO-PROVERA) 150 MG/ML injection Inject 1 mL into the muscle every 3 (three) months.   Yes Historical Provider, MD  acetaminophen (TYLENOL) 325 MG tablet Take 650 mg by mouth every 4 (four) hours as needed for mild pain, moderate pain, fever or headache.     Historical Provider, MD    Current Facility-Administered Medications  Medication Dose Route Frequency Provider Last Rate Last Dose  . acetaminophen (TYLENOL) tablet 650 mg  650 mg Oral Q6H PRN Alford Highland, MD   650 mg at 06/30/15 1540   Or  . acetaminophen (TYLENOL) suppository 650 mg  650 mg Rectal Q6H PRN Alford Highland, MD      .  docusate sodium (COLACE) capsule 100 mg  100 mg Oral BID Alford Highland, MD   100 mg at 06/30/15 1540  . morphine 2 MG/ML injection 2 mg  2 mg Intravenous Q4H PRN Alford Highland, MD      . ondansetron [facility]) injection 4 mg  4 mg Intravenous Q6H PRN Alford Highland, MD      . oxyCODONE (Oxy IR/ROXICODONE) immediate release tablet 5 mg  5 mg Oral Q4H PRN Alford Highland, MD      . pantoprazole (PROTONIX) injection 40 mg  40 mg Intravenous Q12H Alford Highland, MD   40 mg at 06/30/15 1541   Current Outpatient Prescriptions  Medication Sig Dispense Refill  . Calcium Carb-Cholecalciferol (CALCIUM 600-D PO)  Take 1 tablet by mouth daily at 12 noon.    [name] ibuprofen (ADVIL,MOTRIN) 200 MG tablet Take 800 mg by mouth every 6 (six) hours as needed for fever, headache, mild pain, moderate pain or cramping.     . Iron TABS Take 1 tablet by mouth daily at 12 noon.    . medroxyPROGESTERone (DEPO-PROVERA) 150 MG/ML injection Inject 1 mL into the muscle every 3 (three) months.    [name] acetaminophen (TYLENOL) 325 MG tablet Take 650 mg by mouth every 4 (four) hours as needed for mild pain, moderate pain, fever or headache.       Allergies as of 06/30/2015  . (No Known Allergies)     Review of Systems:    All systems reviewed and negative except where noted in HPI.     Physical Exam:  Vital signs in last 24 hours: Temp:  [98.3 F (36.8 C)] 98.3 F (36.8 C) (10/03 0903) Pulse Rate:  [68-102] 75 (10/03 1500) Resp:  [14-20] 14 (10/03 1330) BP: (94-129)/(56-71) 110/66 mmHg (10/03 1500) SpO2:  [86 %-99 %] 86 % (10/03 1500) Weight:  [65.772 kg (145 lb)] 65.772 kg (145 lb) (10/03 0903)   General:   Pleasant young woman in NAD Head:  Normocephalic and atraumatic. Eyes:   No icterus.   Conjunctiva pink. Ears:  Normal auditory acuity. Mouth: Mucosa pink moist, no lesions. Neck:  Supple; no masses felt Lungs: Respirations even and unlabored. Lungs clear to auscultation bilaterally.   No wheezes, crackles, or rhonchi.  Heart:  S1S2, RRR, no MRG. No edema. Abdomen:  Epigastric tenderness. Some guarding.  Flat, soft, nondistended. Normal bowel sounds. No appreciable masses or hepatomegaly. No rebound signs or other peritoneal signs. Rectal:  Not performed.  Msk:  MAEW x4, No clubbing or cyanosis. Strength 5/5. Symmetrical without gross deformities. Neurologic:  Alert and  oriented x4;  Cranial nerves II-XII intact.  Skin:  Warm, dry, pink without significant lesions or rashes. Psych:  Alert and cooperative. Normal affect.  LAB RESULTS:  Recent Labs  06/30/15 1142  WBC 7.8  HGB 14.1  HCT 42.7  PLT 227    BMET  Recent Labs  06/30/15 1142  NA 135  K 4.8  CL 104  CO2 24  GLUCOSE 86  BUN 11  CREATININE 0.68  CALCIUM 9.0   LFT  Recent Labs  06/30/15 1142  PROT 7.0  ALBUMIN 4.2  AST 21  ALT 18  ALKPHOS 59  BILITOT 0.3   PT/INR No results for input(s): LABPROT, INR in the last 72 hours.  STUDIES: Ct Renal Stone Study  06/30/2015   CLINICAL DATA:  Upper abdominal pain since 4 a.m. this morning. She has laparoscopic surgery 2 months ago for uterine adhesion removal.  EXAM: CT ABDOMEN AND  PELVIS WITHOUT CONTRAST  TECHNIQUE: Multidetector CT imaging of the abdomen and pelvis was performed following the standard protocol without IV contrast.  COMPARISON:  Right upper quadrant abdomen ultrasound obtained earlier today. Abdomen and pelvis CT dated 05/08/2015.  FINDINGS: Normal non contrasted appearance of the liver, spleen, pancreas, gallbladder, adrenal glands, kidneys, ureters, urinary bladder, uterus and ovaries. No gastrointestinal abnormalities or enlarged lymph nodes. Multiple small appendicoliths. The largest is a 4 mm proximal appendicolith. The appendix is normal in size with no periappendiceal soft tissue stranding. No enlarged lymph nodes. Clear lung bases. Normal appearing bones. Tiny umbilical hernia containing fat. Small pelvic phleboliths.  IMPRESSION: 1. No acute abnormality. 2. Multiple small appendicoliths without evidence of appendicitis. 3. Tiny umbilical hernia containing fat.   Electronically Signed   By: Beckie Salts M.D.   On: 06/30/2015 14:21   US Abdomen Limited Ruq  06/30/2015   CLINICAL DATA:  Abdominal pain and nausea since 4 a.m.  EXAM: US ABDOMEN LIMITED - RIGHT UPPER QUADRANT  COMPARISON:  05/08/2015 CT abdomen/pelvis.  FINDINGS: Gallbladder:  No gallstones or wall thickening visualized. No sonographic Murphy sign noted.  Common bile duct:  Diameter: 3 mm  Liver:  No focal lesion identified. Within normal limits in parenchymal echogenicity.  Other:  Patent main  portal vein with appropriate flow direction. Incidentally noted is fullness of an extrarenal pelvis in the right kidney without right hydronephrosis, stable compared to the 05/08/2015 CT study.  IMPRESSION: Normal right upper quadrant abdominal sonogram, with no cholelithiasis.   Electronically Signed   By: Delbert Phenix M.D.   On: 06/30/2015 11:42       Impression / Plan:   1. Epigastric pain. History of heavy NSAID use. Recent gastroenteritis and history of GERD. Agree with bid PPI. Recommend EGD for luminal evaluation as clinically feasible. Indications, benefits and risks were discussed with the patient and she is agreeable to the procedure.  Thank you very much for this consult. These services were provided by Vevelyn Pat, NP-C, in collaboration with Christena Deem, MD, with whom I have discussed this patient in full.   Vevelyn Pat, NP-C

## 2015-06-30 NOTE — Progress Notes (Signed)
Went by patient's door before she left ER and she was crying.  Stopped by to offer pastoral care/support.  Chaplain Betsey Holiday pager# [phone]

## 2015-06-30 NOTE — H&P (Addendum)
[facility] [place] [facility]   PATIENT NAME: Mandy Hansen    MR#:  [id number]  DATE OF BIRTH:  Sep 19, 1991  DATE OF ADMISSION:  06/30/2015  PRIMARY CARE PHYSICIAN: Oswaldo Conroy, MD   REQUESTING/REFERRING PHYSICIAN: Jene Every  CHIEF COMPLAINT:   Chief Complaint  Patient presents with  . Abdominal Pain    HISTORY OF PRESENT ILLNESS:  Mandy Hansen  is a 24 y.o. female with a known history of recent abdominal surgery 05/23/2015 where she had a laparoscopic procedure and adhesions from her uterus to intestines was lysed. She has been fine since then. This morning developed abdominal pain 9 out of 10 intensity. It feels like a constant knot that's tightening and tightening. The pain medication helped for about 10 minutes and then started painting again. She does have nausea and history of constipation. Prior to her abdominal surgery she was taking a lot of ibuprofen.  PAST MEDICAL HISTORY:   Past Medical History  Diagnosis Date  . Dysplasia of cervix 2013    dyplasia has resolved since birth of son in 2013.  [name] Psoriasis of scalp 2004    mainly scalp, uses sun for treatment    PAST SURGICAL HISTORY:   Past Surgical History  Procedure Laterality Date  . Cesarean section    . Cesarean section N/A 2013  . Laparoscopy N/A 05/23/2015    Procedure: LAPAROSCOPY DIAGNOSTIC;  Surgeon: Nadara Mustard, MD;  Location: [facility] ORS;  Service: Gynecology;  Laterality: N/A;  . Cystoscopy N/A 05/23/2015    Procedure: CYSTOSCOPY;  Surgeon: Nadara Mustard, MD;  Location: [facility] ORS;  Service: Gynecology;  Laterality: N/A;    SOCIAL HISTORY:   Social History  Substance Use Topics  . Smoking status: Current Every Day Smoker -- 0.50 packs/day    Types: Cigarettes  . Smokeless tobacco: Not on file  . Alcohol Use: No    FAMILY HISTORY:  History reviewed. No pertinent family history.  DRUG ALLERGIES:  No Known Allergies  REVIEW OF SYSTEMS:   CONSTITUTIONAL: No fever, fatigue or weakness. Positive for chills and sweats. Positive for weakness. Positive for weight loss EYES: Positive for blurred vision.  EARS, NOSE, AND THROAT: No tinnitus or ear pain. No sore throat. Positive for runny nose RESPIRATORY: No cough, positive for shortness of breath, no wheezing or hemoptysis.  CARDIOVASCULAR: Positive for chest pain, no orthopnea, edema.  GASTROINTESTINAL: Positive for nausea, no vomiting or diarrhea, positive for abdominal pain. Sometimes hemorrhoids bleed GENITOURINARY: Occasional dysuria, no hematuria.  ENDOCRINE: No polyuria, nocturia,  HEMATOLOGY: No anemia, easy bruising or bleeding SKIN: No rash or lesion. MUSCULOSKELETAL: No joint pain or arthritis.   NEUROLOGIC: No tingling, numbness, weakness.  PSYCHIATRY: No anxiety or depression.   MEDICATIONS AT HOME:   Prior to Admission medications   Medication Sig Start Date End Date Taking? Authorizing Provider  Calcium Carb-Cholecalciferol (CALCIUM 600-D PO) Take 1 tablet by mouth daily at 12 noon.   Yes Historical Provider, MD  ibuprofen (ADVIL,MOTRIN) 200 MG tablet Take 800 mg by mouth every 6 (six) hours as needed for fever, headache, mild pain, moderate pain or cramping.    Yes Historical Provider, MD  Iron TABS Take 1 tablet by mouth daily at 12 noon.   Yes Historical Provider, MD  medroxyPROGESTERone (DEPO-PROVERA) 150 MG/ML injection Inject 1 mL into the muscle every 3 (three) months.   Yes Historical Provider, MD  acetaminophen (TYLENOL) 325 MG tablet Take 650 mg by mouth every 4 (  four) hours as needed for mild pain, moderate pain, fever or headache.     Historical Provider, MD      VITAL SIGNS:  Blood pressure 94/56, pulse 68, temperature 98.3 F (36.8 C), temperature source Oral, resp. rate 14, height  (1.6 m), weight 65.772 kg (145 lb), SpO2 98 %.  PHYSICAL EXAMINATION:  GENERAL:  24 y.o.-year-old patient lying in the bed with no acute distress.  EYES:  Pupils equal, round, reactive to light and accommodation. No scleral icterus. Extraocular muscles intact.  HEENT: Head atraumatic, normocephalic. Oropharynx and nasopharynx clear.  NECK:  Supple, no jugular venous distention. No thyroid enlargement, no tenderness.  LUNGS: Normal breath sounds bilaterally, no wheezing, rales,rhonchi or crepitation. No use of accessory muscles of respiration.  CARDIOVASCULAR: S1, S2 normal. No murmurs, rubs, or gallops.  ABDOMEN: Soft, epigastric abdominal pain, nondistended. Bowel sounds present. No organomegaly or mass.  EXTREMITIES: No pedal edema, cyanosis, or clubbing.  NEUROLOGIC: Cranial nerves II through XII are intact. Muscle strength 5/5 in all extremities. Sensation intact. Gait not checked.  PSYCHIATRIC: The patient is alert and oriented x 3.  SKIN: No rash, lesion, or ulcer.   LABORATORY PANEL:   CBC  Recent Labs Lab 06/30/15 1142  WBC 7.8  HGB 14.1  HCT 42.7  PLT 227   ------------------------------------------------------------------------------------------------------------------  Chemistries   Recent Labs Lab 06/30/15 1142  NA 135  K 4.8  CL 104  CO2 24  GLUCOSE 86  BUN 11  CREATININE 0.68  CALCIUM 9.0  AST 21  ALT 18  ALKPHOS 59  BILITOT 0.3   ------------------------------------------------------------------------------------------------------------------   RADIOLOGY:  Ct Renal Stone Study  06/30/2015   CLINICAL DATA:  Upper abdominal pain since 4 a.m. this morning. She has laparoscopic surgery 2 months ago for uterine adhesion removal.  EXAM: CT ABDOMEN AND PELVIS WITHOUT CONTRAST  TECHNIQUE: Multidetector CT imaging of the abdomen and pelvis was performed following the standard protocol without IV contrast.  COMPARISON:  Right upper quadrant abdomen ultrasound obtained earlier today. Abdomen and pelvis CT dated 05/08/2015.  FINDINGS: Normal non contrasted appearance of the liver, spleen, pancreas, gallbladder,  adrenal glands, kidneys, ureters, urinary bladder, uterus and ovaries. No gastrointestinal abnormalities or enlarged lymph nodes. Multiple small appendicoliths. The largest is a 4 mm proximal appendicolith. The appendix is normal in size with no periappendiceal soft tissue stranding. No enlarged lymph nodes. Clear lung bases. Normal appearing bones. Tiny umbilical hernia containing fat. Small pelvic phleboliths.  IMPRESSION: 1. No acute abnormality. 2. Multiple small appendicoliths without evidence of appendicitis. 3. Tiny umbilical hernia containing fat.   Electronically Signed   By: Beckie Salts M.D.   On: 06/30/2015 14:21   US Abdomen Limited Ruq  06/30/2015   CLINICAL DATA:  Abdominal pain and nausea since 4 a.m.  EXAM: US ABDOMEN LIMITED - RIGHT UPPER QUADRANT  COMPARISON:  05/08/2015 CT abdomen/pelvis.  FINDINGS: Gallbladder:  No gallstones or wall thickening visualized. No sonographic Murphy sign noted.  Common bile duct:  Diameter: 3 mm  Liver:  No focal lesion identified. Within normal limits in parenchymal echogenicity.  Other:  Patent main portal vein with appropriate flow direction. Incidentally noted is fullness of an extrarenal pelvis in the right kidney without right hydronephrosis, stable compared to the 05/08/2015 CT study.  IMPRESSION: Normal right upper quadrant abdominal sonogram, with no cholelithiasis.   Electronically Signed   By: Delbert Phenix M.D.   On: 06/30/2015 11:42    EKG:   Sinus rhythm with sinus  arrhythmia  IMPRESSION AND PLAN:   1.  Epigastric abdominal pain. Patient with history of taking a lot of ibuprofen prior to her previous abdominal surgery. Ultrasound and CAT scan imaging unremarkable and lipase is normal. I will put on IV Protonix. Spoke with Dr. Marva Panda gastroenterology to see the patient in consultation. 2. History of psoriasis 3. History of cervical dysplasia which resolved 4. Tobacco abuse- smoking cessation counseling done 3 minutes by me. No need for  nicotine patch since he only smokes 5 cigarettes a day.  All the records are reviewed and case discussed with ED provider. Management plans discussed with the patient, family and they are in agreement.  CODE STATUS: Full code  TOTAL TIME TAKING CARE OF THIS PATIENT: 50 minutes.    Alford Highland M.D on 06/30/2015 at 3:34 PM  Between 7am to 6pm - Pager - [phone]  After 6pm call admission pager [phone]  [facility]  Office  [phone]  CC: Primary care physician; Oswaldo Conroy, MD

## 2015-07-01 LAB — BASIC METABOLIC PANEL
Anion gap: 7 (ref 5–15)
BUN: 9 mg/dL (ref 6–20)
CHLORIDE: 108 mmol/L (ref 101–111)
CO2: 27 mmol/L (ref 22–32)
Calcium: 9.2 mg/dL (ref 8.9–10.3)
Creatinine, Ser: 0.87 mg/dL (ref 0.44–1.00)
GFR calc non Af Amer: >60 mL/min (ref >60)
Glucose, Bld: 90 mg/dL (ref 65–99)
POTASSIUM: 4.4 mmol/L (ref 3.5–5.1)
SODIUM: 142 mmol/L (ref 135–145)

## 2015-07-01 LAB — CBC
HEMATOCRIT: 43.2 % (ref 35.0–47.0)
Hemoglobin: 14.4 g/dL (ref 12.0–16.0)
MCH: 31 pg (ref 26.0–34.0)
MCHC: 33.2 g/dL (ref 32.0–36.0)
MCV: 93.4 fL (ref 80.0–100.0)
Platelets: 223 10*3/uL (ref 150–440)
RBC: 4.63 MIL/uL (ref 3.80–5.20)
RDW: 13.7 % (ref 11.5–14.5)
WBC: 8.3 10*3/uL (ref 3.6–11.0)

## 2015-07-01 MED ORDER — OXYCODONE HCL 5 MG PO TABS: 5.0000 mg | ORAL_TABLET | ORAL | Status: DC | PRN

## 2015-07-01 MED ORDER — PANTOPRAZOLE SODIUM 40 MG PO TBEC: 40.0000 mg | DELAYED_RELEASE_TABLET | Freq: Every day | ORAL | Status: DC

## 2015-07-01 NOTE — Consult Note (Signed)
Subjective: Patient seen for epigastric pain. Patient states she feels a little bit better today but continuing with epigastric pain and somewhat toward right of midline. There is been some nausea but no emesis. Unable to do EGD today to schedule considerations  Objective: Vital signs in last 24 hours: Temp:  [98.1 F (36.7 C)-98.5 F (36.9 C)] 98.3 F (36.8 C) (10/04 1341) Pulse Rate:  [68-99] 99 (10/04 1341) Resp:  [16-17] 16 (10/04 1341) BP: (105-128)/(55-73) 106/65 mmHg (10/04 1341) SpO2:  [99 %-100 %] 99 % (10/04 1341) Blood pressure 106/65, pulse 99, temperature 98.3 F (36.8 C), temperature source Oral, resp. rate 16, height  (1.6 m), weight 65.772 kg (145 lb), SpO2 99 %.   Intake/Output from previous day:    Intake/Output this shift:     General appearance:  A 24 year old female no acute distress.  Resp:  Clear to auscultation Cardio:  Regular rate and rhythm GI:  Soft positive tender to palpation the upper epigastric region extending toward the right upper quadrant. Extremities:  No clubbing cyanosis or edema   Lab Results: Results for orders placed or performed during the hospital encounter of 06/30/15 (from the past 24 hour(s))  Basic metabolic panel     Status: None   Collection Time: 07/01/15  6:21 AM  Result Value Ref Range   Sodium 142 135 - 145 mmol/L   Potassium 4.4 3.5 - 5.1 mmol/L   Chloride 108 101 - 111 mmol/L   CO2 27 22 - 32 mmol/L   Glucose, Bld 90 65 - 99 mg/dL   BUN 9 6 - 20 mg/dL   Creatinine, Ser 1.61 0.44 - 1.00 mg/dL   Calcium 9.2 8.9 - 09.6 mg/dL   GFR calc non Af Amer >60 >60 mL/min   GFR calc Af Amer >60 >60 mL/min   Anion gap 7 5 - 15  CBC     Status: None   Collection Time: 07/01/15  6:21 AM  Result Value Ref Range   WBC 8.3 3.6 - 11.0 K/uL   RBC 4.63 3.80 - 5.20 MIL/uL   Hemoglobin 14.4 12.0 - 16.0 g/dL   HCT 04.5 40.9 - 81.1 %   MCV 93.4 80.0 - 100.0 fL   MCH 31.0 26.0 - 34.0 pg   MCHC 33.2 32.0 - 36.0 g/dL   RDW 91.4  78.2 - 95.6 %   Platelets 223 150 - 440 K/uL      Recent Labs  06/30/15 1142 07/01/15 0621  WBC 7.8 8.3  HGB 14.1 14.4  HCT 42.7 43.2  PLT 227 223   BMET  Recent Labs  06/30/15 1142 07/01/15 0621  NA 135 142  K 4.8 4.4  CL 104 108  CO2 24 27  GLUCOSE 86 90  BUN 11 9  CREATININE 0.68 0.87  CALCIUM 9.0 9.2   LFT  Recent Labs  06/30/15 1142  PROT 7.0  ALBUMIN 4.2  AST 21  ALT 18  ALKPHOS 59  BILITOT 0.3   PT/INR No results for input(s): LABPROT, INR in the last 72 hours. Hepatitis Panel No results for input(s): HEPBSAG, HCVAB, HEPAIGM, HEPBIGM in the last 72 hours. C-Diff No results for input(s): CDIFFTOX in the last 72 hours. No results for input(s): CDIFFPCR in the last 72 hours.   Studies/Results: Ct Renal Stone Study  06/30/2015   CLINICAL DATA:  Upper abdominal pain since 4 a.m. this morning. She has laparoscopic surgery 2 months ago for uterine adhesion removal.  EXAM: CT ABDOMEN AND  PELVIS WITHOUT CONTRAST  TECHNIQUE: Multidetector CT imaging of the abdomen and pelvis was performed following the standard protocol without IV contrast.  COMPARISON:  Right upper quadrant abdomen ultrasound obtained earlier today. Abdomen and pelvis CT dated 05/08/2015.  FINDINGS: Normal non contrasted appearance of the liver, spleen, pancreas, gallbladder, adrenal glands, kidneys, ureters, urinary bladder, uterus and ovaries. No gastrointestinal abnormalities or enlarged lymph nodes. Multiple small appendicoliths. The largest is a 4 mm proximal appendicolith. The appendix is normal in size with no periappendiceal soft tissue stranding. No enlarged lymph nodes. Clear lung bases. Normal appearing bones. Tiny umbilical hernia containing fat. Small pelvic phleboliths.  IMPRESSION: 1. No acute abnormality. 2. Multiple small appendicoliths without evidence of appendicitis. 3. Tiny umbilical hernia containing fat.   Electronically Signed   By: Beckie Salts M.D.   On: 06/30/2015 14:21    US Abdomen Limited Ruq  06/30/2015   CLINICAL DATA:  Abdominal pain and nausea since 4 a.m.  EXAM: US ABDOMEN LIMITED - RIGHT UPPER QUADRANT  COMPARISON:  05/08/2015 CT abdomen/pelvis.  FINDINGS: Gallbladder:  No gallstones or wall thickening visualized. No sonographic Murphy sign noted.  Common bile duct:  Diameter: 3 mm  Liver:  No focal lesion identified. Within normal limits in parenchymal echogenicity.  Other:  Patent main portal vein with appropriate flow direction. Incidentally noted is fullness of an extrarenal pelvis in the right kidney without right hydronephrosis, stable compared to the 05/08/2015 CT study.  IMPRESSION: Normal right upper quadrant abdominal sonogram, with no cholelithiasis.   Electronically Signed   By: Delbert Phenix M.D.   On: 06/30/2015 11:42    Scheduled Inpatient Medications:   . docusate sodium  100 mg Oral BID  . ferrous sulfate  325 mg Oral Q1200  . Influenza vac split quadrivalent PF  0.5 mL Intramuscular Tomorrow-1000  . pantoprazole (PROTONIX) IV  40 mg Intravenous Q12H  . pneumococcal 23 valent vaccine  0.5 mL Intramuscular Tomorrow-1000    Continuous Inpatient Infusions:     PRN Inpatient Medications:  acetaminophen **OR** acetaminophen, morphine injection, ondansetron (ZOFRAN) IV, oxyCODONE  Miscellaneous:   Assessment:  1. Epigastric pain of uncertain etiology in the setting of previous NSAID use. Some improvement overnight.  Plan:  EGD in the morning. I have discussed the risks benefits and complications of procedures to include not limited to bleeding, infection, perforation and the risk of sedation and the patient wishes to proceed. Continue PPI and current. Further recommendations to follow  Christena Deem MD 07/01/2015, 7:03 PM

## 2015-07-01 NOTE — Progress Notes (Signed)
[facility] - [place] at [facility]   PATIENT NAME: Mandy Hansen    MR#:  [id number]  DATE OF BIRTH:  1991-01-01  SUBJECTIVE:  Mid epigastric pain no melena or n/v REVIEW OF SYSTEMS:    Review of Systems  Constitutional: Negative for fever, chills and malaise/fatigue.  HENT: Negative for sore throat.   Eyes: Negative for blurred vision.  Respiratory: Negative for cough, hemoptysis, shortness of breath and wheezing.   Cardiovascular: Negative for chest pain, palpitations and leg swelling.  Gastrointestinal: Positive for abdominal pain. Negative for nausea, vomiting, diarrhea and blood in stool.  Genitourinary: Negative for dysuria.  Musculoskeletal: Negative for back pain.  Neurological: Negative for dizziness, tremors and headaches.  Endo/Heme/Allergies: Does not bruise/bleed easily.    Tolerating Diet:NPO      DRUG ALLERGIES:  No Known Allergies  VITALS:  Blood pressure 121/59, pulse 78, temperature 98.5 F (36.9 C), temperature source Oral, resp. rate 17, height  (1.6 m), weight 65.772 kg (145 lb), SpO2 100 %.  PHYSICAL EXAMINATION:   Physical Exam  Constitutional: She is oriented to person, place, and time and well-developed, well-nourished, and in no distress. No distress.  HENT:  Head: Normocephalic.  Eyes: No scleral icterus.  Neck: Normal range of motion. Neck supple. No JVD present. No tracheal deviation present.  Cardiovascular: Normal rate, regular rhythm and normal heart sounds.  Exam reveals no gallop and no friction rub.   No murmur heard. Pulmonary/Chest: Effort normal and breath sounds normal. No respiratory distress. She has no wheezes. She has no rales. She exhibits no tenderness.  Abdominal: Soft. Bowel sounds are normal. She exhibits no distension and no mass. There is tenderness. There is no rebound and no guarding.  Musculoskeletal: Normal range of motion. She exhibits no edema.  Neurological: She is alert and  oriented to person, place, and time.  Skin: Skin is warm. No rash noted. No erythema.  Psychiatric: Affect and judgment normal.      LABORATORY PANEL:   CBC  Recent Labs Lab 07/01/15 0621  WBC 8.3  HGB 14.4  HCT 43.2  PLT 223   ------------------------------------------------------------------------------------------------------------------  Chemistries   Recent Labs Lab 06/30/15 1142 07/01/15 0621  NA 135 142  K 4.8 4.4  CL 104 108  CO2 24 27  GLUCOSE 86 90  BUN 11 9  CREATININE 0.68 0.87  CALCIUM 9.0 9.2  AST 21  --   ALT 18  --   ALKPHOS 59  --   BILITOT 0.3  --    ------------------------------------------------------------------------------------------------------------------  Cardiac Enzymes No results for input(s): TROPONINI in the last 168 hours. ------------------------------------------------------------------------------------------------------------------  RADIOLOGY:  Ct Renal Stone Study  06/30/2015   CLINICAL DATA:  Upper abdominal pain since 4 a.m. this morning. She has laparoscopic surgery 2 months ago for uterine adhesion removal.  EXAM: CT ABDOMEN AND PELVIS WITHOUT CONTRAST  TECHNIQUE: Multidetector CT imaging of the abdomen and pelvis was performed following the standard protocol without IV contrast.  COMPARISON:  Right upper quadrant abdomen ultrasound obtained earlier today. Abdomen and pelvis CT dated 05/08/2015.  FINDINGS: Normal non contrasted appearance of the liver, spleen, pancreas, gallbladder, adrenal glands, kidneys, ureters, urinary bladder, uterus and ovaries. No gastrointestinal abnormalities or enlarged lymph nodes. Multiple small appendicoliths. The largest is a 4 mm proximal appendicolith. The appendix is normal in size with no periappendiceal soft tissue stranding. No enlarged lymph nodes. Clear lung bases. Normal appearing bones. Tiny umbilical hernia containing fat. Small pelvic phleboliths.  IMPRESSION:  1. No acute abnormality.  2. Multiple small appendicoliths without evidence of appendicitis. 3. Tiny umbilical hernia containing fat.   Electronically Signed   By: Beckie Salts M.D.   On: 06/30/2015 14:21   US Abdomen Limited Ruq  06/30/2015   CLINICAL DATA:  Abdominal pain and nausea since 4 a.m.  EXAM: US ABDOMEN LIMITED - RIGHT UPPER QUADRANT  COMPARISON:  05/08/2015 CT abdomen/pelvis.  FINDINGS: Gallbladder:  No gallstones or wall thickening visualized. No sonographic Murphy sign noted.  Common bile duct:  Diameter: 3 mm  Liver:  No focal lesion identified. Within normal limits in parenchymal echogenicity.  Other:  Patent main portal vein with appropriate flow direction. Incidentally noted is fullness of an extrarenal pelvis in the right kidney without right hydronephrosis, stable compared to the 05/08/2015 CT study.  IMPRESSION: Normal right upper quadrant abdominal sonogram, with no cholelithiasis.   Electronically Signed   By: Delbert Phenix M.D.   On: 06/30/2015 11:42     ASSESSMENT AND PLAN:  24 year old female who presents with epigastric pain.  1. Epigastric pain: Patient has a long history of GERD. She is to undergo EGD. She will continue on Protonix.  2. History of psoriasis      Management plans discussed with the patient and she is in agreement.  CODE STATUS: FULL  TOTAL TIME TAKING CARE OF THIS PATIENT: 30 minutes.     POSSIBLE D/C today or tomorrow, DEPENDING ON CLINICAL CONDITION.   Dama Hedgepeth M.D on 07/01/2015 at 9:23 PM  Between 7am to 6pm - Pager - [phone] After 6pm go to [email or web address] - password [email or web address] [facility]  [facility]  Office  [phone]  CC: Primary care physician; Oswaldo Conroy, MD  Note: This dictation was prepared with Dragon dictation along with smaller phrase technology. Any transcriptional errors that result from this process are unintentional.

## 2015-07-02 ENCOUNTER — Encounter: Payer: Self-pay | Admitting: Anesthesiology

## 2015-07-02 ENCOUNTER — Encounter
Admission: EM | Disposition: A | Discharge status: Home / Self Care | Payer: Self-pay | Source: Home / Self Care | Attending: Emergency Medicine

## 2015-07-02 ENCOUNTER — Observation Stay: Payer: Medicaid Other

## 2015-07-02 ENCOUNTER — Observation Stay: Payer: Medicaid Other | Admitting: Anesthesiology

## 2015-07-02 HISTORY — PX: ESOPHAGOGASTRODUODENOSCOPY: SHX5428

## 2015-07-02 LAB — LIPASE, BLOOD: LIPASE: 22 U/L (ref 22–51)

## 2015-07-02 SURGERY — EGD (ESOPHAGOGASTRODUODENOSCOPY)
Anesthesia: General

## 2015-07-02 SURGERY — ESOPHAGOGASTRODUODENOSCOPY (EGD) WITH PROPOFOL
Anesthesia: General

## 2015-07-02 MED ORDER — IOHEXOL 350 MG/ML SOLN
100.0000 mL | Freq: Once | INTRAVENOUS | Status: AC | PRN
  Administered 2015-07-02: 100 mL via INTRAVENOUS

## 2015-07-02 MED ORDER — SODIUM CHLORIDE 0.9 % IV SOLN
1000.0000 mL | INTRAVENOUS | Status: DC
  Administered 2015-07-02: 07:00:00 via INTRAVENOUS

## 2015-07-02 MED ORDER — DOCUSATE SODIUM 100 MG PO CAPS: 100.0000 mg | ORAL_CAPSULE | Freq: Two times a day (BID) | ORAL | Status: DC

## 2015-07-02 MED ORDER — LIDOCAINE HCL (CARDIAC) 20 MG/ML IV SOLN
INTRAVENOUS | Status: DC | PRN
  Administered 2015-07-02: 100 mg via INTRAVENOUS

## 2015-07-02 MED ORDER — SENNA 8.6 MG PO TABS
1.0000 | ORAL_TABLET | Freq: Every day | ORAL | Status: DC
  Administered 2015-07-02: 8.6 mg via ORAL
  Filled 2015-07-02: qty 1

## 2015-07-02 MED ORDER — SODIUM CHLORIDE 0.9 % IV SOLN
1000.0000 mL | INTRAVENOUS | Status: DC
  Administered 2015-07-02: 1000 mL via INTRAVENOUS

## 2015-07-02 MED ORDER — GLYCOPYRROLATE 0.2 MG/ML IJ SOLN
INTRAMUSCULAR | Status: DC | PRN
  Administered 2015-07-02: .2 mg via INTRAVENOUS

## 2015-07-02 MED ORDER — IOHEXOL 240 MG/ML SOLN
25.0000 mL | INTRAMUSCULAR | Status: AC
  Administered 2015-07-02 (×2): 25 mL via ORAL

## 2015-07-02 MED ORDER — PROPOFOL 500 MG/50ML IV EMUL
INTRAVENOUS | Status: DC | PRN
  Administered 2015-07-02: 200 ug/kg/min via INTRAVENOUS

## 2015-07-02 MED ORDER — PROPOFOL 10 MG/ML IV BOLUS
INTRAVENOUS | Status: DC | PRN
  Administered 2015-07-02: 40 mg via INTRAVENOUS
  Administered 2015-07-02: 60 mg via INTRAVENOUS
  Administered 2015-07-02 (×4): 40 mg via INTRAVENOUS

## 2015-07-02 NOTE — Progress Notes (Signed)
[facility] - [place] at [facility] Mandy Hansen was admitted to the Hospital on 06/30/2015 and Discharged  07/02/2015 and should be excused from work/school   for 3  days starting 06/30/2015 , may return to work/school without any restrictions.  Call Adrian Saran MD with questions.  Joffre Lucks M.D on 07/02/2015,at 12:47 PM  [facility] - [place] at [facility]  [phone]

## 2015-07-02 NOTE — Anesthesia Preprocedure Evaluation (Signed)
Anesthesia Evaluation  Patient identified by MRN, date of birth, ID band Patient awake    Reviewed: Allergy & Precautions, H&P , NPO status , Patient's Chart, lab work & pertinent test results  Airway Mallampati: II  TM Distance: >3 FB Neck ROM: full    Dental no notable dental hx. (+) Poor Dentition, Caps   Pulmonary neg pulmonary ROS, neg shortness of breath, Current Smoker,    Pulmonary exam normal breath sounds clear to auscultation       Cardiovascular Exercise Tolerance: Good negative cardio ROS Normal cardiovascular exam Rhythm:regular Rate:Normal     Neuro/Psych negative neurological ROS  negative psych ROS   GI/Hepatic Neg liver ROS, GERD  Controlled,  Endo/Other  negative endocrine ROS  Renal/GU negative Renal ROS  negative genitourinary   Musculoskeletal   Abdominal   Peds  Hematology negative hematology ROS (+)   Anesthesia Other Findings Past Medical History:   Dysplasia of cervix                             2013           Comment:dyplasia has resolved since birth of son in               2013.   Psoriasis of scalp                              2004           Comment:mainly scalp, uses sun for treatment   Reproductive/Obstetrics negative OB ROS                             Anesthesia Physical Anesthesia Plan  ASA: III  Anesthesia Plan: General   Post-op Pain Management:    Induction:   Airway Management Planned:   Additional Equipment:   Intra-op Plan:   Post-operative Plan:   Informed Consent: I have reviewed the patients History and Physical, chart, labs and discussed the procedure including the risks, benefits and alternatives for the proposed anesthesia with the patient or authorized representative who has indicated his/her understanding and acceptance.   Dental Advisory Given  Plan Discussed with: Anesthesiologist, CRNA and Surgeon  Anesthesia Plan  Comments:         Anesthesia Quick Evaluation

## 2015-07-02 NOTE — Op Note (Signed)
[facility] Gastroenterology Patient Name: Mandy Hansen Procedure Date: 07/02/2015 7:31 AM MRN: [id number] Account #: [id number] Date of Birth: 06/22/1991 Admit Type: Inpatient Age: 24 Room: [facility] ENDO ROOM 1 Gender: Female Note Status: Finalized Procedure:         Upper GI endoscopy Indications:       Epigastric abdominal pain Providers:         Christena Deem, MD Medicines:         Monitored Anesthesia Care Complications:     No immediate complications. Procedure:         Pre-Anesthesia Assessment:                    - ASA Grade Assessment: II - A patient with mild systemic                     disease.                    After obtaining informed consent, the endoscope was passed                     under direct vision. Throughout the procedure, the                     patient's blood pressure, pulse, and oxygen saturations                     were monitored continuously. The Endoscope was introduced                     through the mouth, and advanced to the second part of                     duodenum. The upper GI endoscopy was accomplished without                     difficulty. The patient tolerated the procedure. Findings:      The Z-line was variable. Biopsies were taken with a cold forceps for       histology.      The exam of the esophagus was otherwise normal.      The entire examined stomach was normal.      Biopsies were taken with a cold forceps in the gastric body and in the       gastric antrum for histology.      Localized nodular mucosa was found in the duodenal bulb. Biopsies were       taken with a cold forceps for histology. Tthere is a marked angulation       of the duode3nal c-loop, withtout apparent inflammation. I was only able       to get to the 2-3rd portion .      The exam of the duodenum was otherwise normal.      The cardia and gastric fundus were normal on retroflexion. Impression:        - Z-line variable. Biopsied.           - Normal stomach.                    - Nodular mucosa in the duodenal bulb. Biopsied.                    - Biopsies were taken with a cold forceps for histology in  the gastric body and in the gastric antrum. Recommendation:    - The findings and recommendations were discussed with the                     patient's primary physician.                    - Continue present medications. Procedure Code(s): --- Professional ---                    [phone], Esophagogastroduodenoscopy, flexible, transoral;                     with biopsy, single or multiple Diagnosis Code(s): --- Professional ---                    530.89, Other specified disorders of esophagus                    537.9, Unspecified disorder of stomach and duodenum                    789.06, Abdominal pain, epigastric CPT copyright 2014 American Medical Association. All rights reserved. The codes documented in this report are preliminary and upon coder review may  be revised to meet current compliance requirements. Christena Deem, MD 07/02/2015 7:48:20 AM This report has been signed electronically. Number of Addenda: 0 Note Initiated On: 07/02/2015 7:31 AM      [facility]

## 2015-07-02 NOTE — Progress Notes (Signed)
Pt advised that a wheelchair had been called for escort.  Pt diid not wait for wheelchair and walked out.

## 2015-07-02 NOTE — Progress Notes (Signed)
[facility] - [place] at [facility]   PATIENT NAME: Mandy Hansen    MR#:  [id number]  DATE OF BIRTH:  [birth year]  SUBJECTIVE:  Patient continues to have midepigastric pain   REVIEW OF SYSTEMS:    Review of Systems  Constitutional: Negative for fever, chills and malaise/fatigue.  HENT: Negative for sore throat.   Eyes: Negative for blurred vision.  Respiratory: Negative for cough, hemoptysis, shortness of breath and wheezing.   Cardiovascular: Negative for chest pain, palpitations and leg swelling.  Gastrointestinal: Positive for abdominal pain. Negative for nausea, vomiting, diarrhea and blood in stool.  Genitourinary: Negative for dysuria.  Musculoskeletal: Negative for back pain.  Neurological: Negative for dizziness, tremors and headaches.  Endo/Heme/Allergies: Does not bruise/bleed easily.    Tolerating Diet: No    DRUG ALLERGIES:  No Known Allergies  VITALS:  Blood pressure 107/64, pulse 60, temperature 97.1 F (36.2 C), temperature source Oral, resp. rate 21, height  (1.6 m), weight 65.772 kg (145 lb), SpO2 100 %.  PHYSICAL EXAMINATION:   Physical Exam  Constitutional: She is oriented to person, place, and time and well-developed, well-nourished, and in no distress. No distress.  HENT:  Head: Normocephalic.  Eyes: No scleral icterus.  Neck: Normal range of motion. Neck supple. No JVD present. No tracheal deviation present.  Cardiovascular: Normal rate, regular rhythm and normal heart sounds.  Exam reveals no gallop and no friction rub.   No murmur heard. Pulmonary/Chest: Effort normal and breath sounds normal. No respiratory distress. She has no wheezes. She has no rales. She exhibits no tenderness.  Abdominal: Soft. Bowel sounds are normal. She exhibits no distension and no mass. There is tenderness. There is no rebound and no guarding.  Musculoskeletal: Normal range of motion. She exhibits no edema.  Neurological: She is alert  and oriented to person, place, and time.  Skin: Skin is warm. No rash noted. No erythema.  Psychiatric: Affect and judgment normal.      LABORATORY PANEL:   CBC  Recent Labs Lab 07/01/15 0621  WBC 8.3  HGB 14.4  HCT 43.2  PLT 223   ------------------------------------------------------------------------------------------------------------------  Chemistries   Recent Labs Lab 06/30/15 1142 07/01/15 0621  NA 135 142  K 4.8 4.4  CL 104 108  CO2 24 27  GLUCOSE 86 90  BUN 11 9  CREATININE 0.68 0.87  CALCIUM 9.0 9.2  AST 21  --   ALT 18  --   ALKPHOS 59  --   BILITOT 0.3  --    ------------------------------------------------------------------------------------------------------------------  Cardiac Enzymes No results for input(s): TROPONINI in the last 168 hours. ------------------------------------------------------------------------------------------------------------------  RADIOLOGY:  Ct Renal Stone Study  06/30/2015   CLINICAL DATA:  Upper abdominal pain since 4 a.m. this morning. She has laparoscopic surgery 2 months ago for uterine adhesion removal.  EXAM: CT ABDOMEN AND PELVIS WITHOUT CONTRAST  TECHNIQUE: Multidetector CT imaging of the abdomen and pelvis was performed following the standard protocol without IV contrast.  COMPARISON:  Right upper quadrant abdomen ultrasound obtained earlier today. Abdomen and pelvis CT dated 05/08/2015.  FINDINGS: Normal non contrasted appearance of the liver, spleen, pancreas, gallbladder, adrenal glands, kidneys, ureters, urinary bladder, uterus and ovaries. No gastrointestinal abnormalities or enlarged lymph nodes. Multiple small appendicoliths. The largest is a 4 mm proximal appendicolith. The appendix is normal in size with no periappendiceal soft tissue stranding. No enlarged lymph nodes. Clear lung bases. Normal appearing bones. Tiny umbilical hernia containing fat. Small pelvic phleboliths.  IMPRESSION:  1. No acute  abnormality. 2. Multiple small appendicoliths without evidence of appendicitis. 3. Tiny umbilical hernia containing fat.   Electronically Signed   By: Beckie Salts M.D.   On: 06/30/2015 14:21   US Abdomen Limited Ruq  06/30/2015   CLINICAL DATA:  Abdominal pain and nausea since 4 a.m.  EXAM: US ABDOMEN LIMITED - RIGHT UPPER QUADRANT  COMPARISON:  05/08/2015 CT abdomen/pelvis.  FINDINGS: Gallbladder:  No gallstones or wall thickening visualized. No sonographic Murphy sign noted.  Common bile duct:  Diameter: 3 mm  Liver:  No focal lesion identified. Within normal limits in parenchymal echogenicity.  Other:  Patent main portal vein with appropriate flow direction. Incidentally noted is fullness of an extrarenal pelvis in the right kidney without right hydronephrosis, stable compared to the 05/08/2015 CT study.  IMPRESSION: Normal right upper quadrant abdominal sonogram, with no cholelithiasis.   Electronically Signed   By: Delbert Phenix M.D.   On: 06/30/2015 11:42     ASSESSMENT AND PLAN:  24 year old female who presents with epigastric pain.  1. Epigastric pain: Patient underwent EGD which was normal. I will order CT scan of abdomen to further evaluate her abdominal pain and a chest CT to evaluate for pulmonary emboli. I will also check lipase level given the location of the pain I am suspicious for either pancreatitis or pulmonary emboli.   2. History of psoriasis      Management plans discussed with the patient and she is in agreement. Plan discussed with Dr. Marva Panda  CODE STATUS: FULL  TOTAL TIME TAKING CARE OF THIS PATIENT: 25 minutes.     POSSIBLE D/C 1-2 days, DEPENDING ON CLINICAL CONDITION.   Shontel Santee M.D on 07/02/2015 at 11:00 AM  Between 7am to 6pm - Pager - [phone] After 6pm go to [email or web address] - password [email or web address] [facility]  [place] [facility]  Office  [phone]  CC: Primary care physician; Oswaldo Conroy, MD  Note: This dictation was prepared with  Dragon dictation along with smaller phrase technology. Any transcriptional errors that result from this process are unintentional.

## 2015-07-02 NOTE — Anesthesia Postprocedure Evaluation (Signed)
  Anesthesia Post-op Note  Patient: Mandy Hansen  Procedure(s) Performed: Procedure(s): ESOPHAGOGASTRODUODENOSCOPY (EGD) looking in the esophagus stomach and upper small intestine with a lighted tube to evaluate and treat (N/A)  Anesthesia type:General  Patient location: PACU  Post pain: Pain level controlled  Post assessment: Post-op Vital signs reviewed, Patient's Cardiovascular Status Stable, Respiratory Function Stable, Patent Airway and No signs of Nausea or vomiting  Post vital signs: Reviewed and stable  Last Vitals:  Filed Vitals:   07/02/15 0810  BP: 107/64  Pulse: 60  Temp:   Resp: 21    Level of consciousness: awake, alert  and patient cooperative  Complications: No apparent anesthesia complications

## 2015-07-02 NOTE — Transfer of Care (Signed)
Immediate Anesthesia Transfer of Care Note  Patient: Mandy Hansen  Procedure(s) Performed: Procedure(s): ESOPHAGOGASTRODUODENOSCOPY (EGD) looking in the esophagus stomach and upper small intestine with a lighted tube to evaluate and treat (N/A)  Patient Location: Endoscopy Unit  Anesthesia Type:General  Level of Consciousness: sedated  Airway & Oxygen Therapy: Patient Spontanous Breathing and Patient connected to nasal cannula oxygen  Post-op Assessment: Report given to RN and Post -op Vital signs reviewed and stable  Post vital signs: Reviewed and stable  Last Vitals:  Filed Vitals:   07/02/15 0715  BP: 121/81  Pulse:   Temp: 36.2 C  Resp:     Complications: No apparent anesthesia complications

## 2015-07-02 NOTE — Consult Note (Signed)
Subjective: Patient seen for epigastric pain. Patient continues to have epigastric pain this morning. There is some mild nausea but no emesis. She states the pain is currently about a 4 out of 10. Not had a bowel movement for several days.  Objective: Vital signs in last 24 hours: Temp:  [97.1 F (36.2 C)-98.6 F (37 C)] 97.1 F (36.2 C) (10/05 0715) Pulse Rate:  [60-99] 72 (10/05 0419) Resp:  [16-18] 18 (10/05 0419) BP: (106-121)/(59-81) 121/81 mmHg (10/05 0715) SpO2:  [98 %-100 %] 98 % (10/05 0419) Blood pressure 121/81, pulse 72, temperature 97.1 F (36.2 C), temperature source Oral, resp. rate 18, height  (1.6 m), weight 65.772 kg (145 lb), SpO2 98 %.   Intake/Output from previous day:    Intake/Output this shift:     General appearance:  A 24 year old female no acute distress Resp:  Clear to auscultation Cardio:  Regular rate and rhythm  GI:   soft positive tender to palpation the upper epigastric region more so toward the right.  Extremities:  No clubbing cyanosis or edema   Lab Results: No results found for this or any previous visit (from the past 24 hour(s)).    Recent Labs  06/30/15 1142 07/01/15 0621  WBC 7.8 8.3  HGB 14.1 14.4  HCT 42.7 43.2  PLT 227 223   BMET  Recent Labs  06/30/15 1142 07/01/15 0621  NA 135 142  K 4.8 4.4  CL 104 108  CO2 24 27  GLUCOSE 86 90  BUN 11 9  CREATININE 0.68 0.87  CALCIUM 9.0 9.2   LFT  Recent Labs  06/30/15 1142  PROT 7.0  ALBUMIN 4.2  AST 21  ALT 18  ALKPHOS 59  BILITOT 0.3   PT/INR No results for input(s): LABPROT, INR in the last 72 hours. Hepatitis Panel No results for input(s): HEPBSAG, HCVAB, HEPAIGM, HEPBIGM in the last 72 hours. C-Diff No results for input(s): CDIFFTOX in the last 72 hours. No results for input(s): CDIFFPCR in the last 72 hours.   Studies/Results: Ct Renal Stone Study  06/30/2015   CLINICAL DATA:  Upper abdominal pain since 4 a.m. this morning. She has  laparoscopic surgery 2 months ago for uterine adhesion removal.  EXAM: CT ABDOMEN AND PELVIS WITHOUT CONTRAST  TECHNIQUE: Multidetector CT imaging of the abdomen and pelvis was performed following the standard protocol without IV contrast.  COMPARISON:  Right upper quadrant abdomen ultrasound obtained earlier today. Abdomen and pelvis CT dated 05/08/2015.  FINDINGS: Normal non contrasted appearance of the liver, spleen, pancreas, gallbladder, adrenal glands, kidneys, ureters, urinary bladder, uterus and ovaries. No gastrointestinal abnormalities or enlarged lymph nodes. Multiple small appendicoliths. The largest is a 4 mm proximal appendicolith. The appendix is normal in size with no periappendiceal soft tissue stranding. No enlarged lymph nodes. Clear lung bases. Normal appearing bones. Tiny umbilical hernia containing fat. Small pelvic phleboliths.  IMPRESSION: 1. No acute abnormality. 2. Multiple small appendicoliths without evidence of appendicitis. 3. Tiny umbilical hernia containing fat.   Electronically Signed   By: Beckie Salts M.D.   On: 06/30/2015 14:21   US Abdomen Limited Ruq  06/30/2015   CLINICAL DATA:  Abdominal pain and nausea since 4 a.m.  EXAM: US ABDOMEN LIMITED - RIGHT UPPER QUADRANT  COMPARISON:  05/08/2015 CT abdomen/pelvis.  FINDINGS: Gallbladder:  No gallstones or wall thickening visualized. No sonographic Murphy sign noted.  Common bile duct:  Diameter: 3 mm  Liver:  No focal lesion identified. Within normal limits in  parenchymal echogenicity.  Other:  Patent main portal vein with appropriate flow direction. Incidentally noted is fullness of an extrarenal pelvis in the right kidney without right hydronephrosis, stable compared to the 05/08/2015 CT study.  IMPRESSION: Normal right upper quadrant abdominal sonogram, with no cholelithiasis.   Electronically Signed   By: Delbert Phenix M.D.   On: 06/30/2015 11:42    Scheduled Inpatient Medications:   . [MAR Hold] docusate sodium  100 mg  Oral BID  . [MAR Hold] ferrous sulfate  325 mg Oral Q1200  . Influenza vac split quadrivalent PF  0.5 mL Intramuscular Tomorrow-1000  . [MAR Hold] pantoprazole (PROTONIX) IV  40 mg Intravenous Q12H  . pneumococcal 23 valent vaccine  0.5 mL Intramuscular Tomorrow-1000    Continuous Inpatient Infusions:   . sodium chloride    . sodium chloride 1,000 mL (07/02/15 0722)    PRN Inpatient Medications:  [MAR Hold] acetaminophen **OR** [MAR Hold] acetaminophen, [MAR Hold]  morphine injection, [MAR Hold] ondansetron (ZOFRAN) IV, [MAR Hold] oxyCODONE  Miscellaneous:   Assessment:  1. Epigastric pain  Plan:  EGD this morning. Further recommendations to follow I have discussed the risks benefits and complications of procedures to include not limited to bleeding, infection, perforation and the risk of sedation and the patient wishes to proceed.  Christena Deem MD 07/02/2015, 7:25 AM

## 2015-07-02 NOTE — Discharge Summary (Signed)
[facility] - [place] at [facility]   PATIENT NAME: Mandy Hansen    MR#:  [id number]  DATE OF BIRTH:  Jan 05, 1991  DATE OF ADMISSION:  06/30/2015 ADMITTING PHYSICIAN: Alford Highland, MD  DATE OF DISCHARGE: 07/02/2015 PRIMARY CARE PHYSICIAN: Oswaldo Conroy, MD    ADMISSION DIAGNOSIS:  Abdominal pain [R10.9]  DISCHARGE DIAGNOSIS:  Active Problems:   Epigastric pain   SECONDARY DIAGNOSIS:   Past Medical History  Diagnosis Date  . Dysplasia of cervix 2013    dyplasia has resolved since birth of son in 2013.  [name] Psoriasis of scalp 2004    mainly scalp, uses sun for treatment    HOSPITAL COURSE:   24 year old female who presents with epigastric pain.  1. Epigastric pain: Patient underwent EGD which was normal. CT scan of the abdomen and chest was negative for acute pathology. I am wondering if this may be psychological in nature.  2. History of psoriasis  DISCHARGE CONDITIONS AND DIET:  Stable condition to home with regular diet  CONSULTS OBTAINED:     DRUG ALLERGIES:  No Known Allergies  DISCHARGE MEDICATIONS:   Current Discharge Medication List    START taking these medications   Details  oxyCODONE (OXY IR/ROXICODONE) 5 MG immediate release tablet Take 1 tablet (5 mg total) by mouth every 4 (four) hours as needed for moderate pain. Qty: 30 tablet, Refills: 0    pantoprazole (PROTONIX) 40 MG tablet Take 1 tablet (40 mg total) by mouth daily. Qty: 60 tablet, Refills: 0      CONTINUE these medications which have NOT CHANGED   Details  Calcium Carb-Cholecalciferol (CALCIUM 600-D PO) Take 1 tablet by mouth daily at 12 noon.    Iron TABS Take 1 tablet by mouth daily at 12 noon.    medroxyPROGESTERone (DEPO-PROVERA) 150 MG/ML injection Inject 1 mL into the muscle every 3 (three) months.    acetaminophen (TYLENOL) 325 MG tablet Take 650 mg by mouth every 4 (four) hours as needed for mild pain, moderate pain, fever or headache.        STOP taking these medications     ibuprofen (ADVIL,MOTRIN) 200 MG tablet               Today   CHIEF COMPLAINT:  Patient with abdominal pain tearful   VITAL SIGNS:  Blood pressure 119/73, pulse 100, temperature 98.8 F (37.1 C), temperature source Oral, resp. rate 18, height  (1.6 m), weight 65.772 kg (145 lb), SpO2 99 %.   REVIEW OF SYSTEMS:  Review of Systems  Constitutional: Negative for fever, chills and malaise/fatigue.  HENT: Negative for sore throat.   Eyes: Negative for blurred vision.  Respiratory: Negative for cough, hemoptysis, shortness of breath and wheezing.   Cardiovascular: Negative for chest pain, palpitations and leg swelling.  Gastrointestinal: Positive for abdominal pain. Negative for nausea, vomiting, diarrhea and blood in stool.  Genitourinary: Negative for dysuria.  Musculoskeletal: Negative for back pain.  Neurological: Negative for dizziness, tremors and headaches.  Endo/Heme/Allergies: Does not bruise/bleed easily.     PHYSICAL EXAMINATION:  GENERAL:  24 y.o.-year-old patient lying in the bed tearful.  NECK:  Supple, no jugular venous distention. No thyroid enlargement, no tenderness.  LUNGS: Normal breath sounds bilaterally, no wheezing, rales,rhonchi  No use of accessory muscles of respiration.  CARDIOVASCULAR: S1, S2 normal. No murmurs, rubs, or gallops.  ABDOMEN: Soft, non-tender, non-distended. Bowel sounds present. No organomegaly or mass.  EXTREMITIES: No pedal edema, cyanosis, or clubbing.  PSYCHIATRIC: The patient is alert and oriented x 3.  SKIN: No obvious rash, lesion, or ulcer.   DATA REVIEW:   CBC  Recent Labs Lab 07/01/15 0621  WBC 8.3  HGB 14.4  HCT 43.2  PLT 223    Chemistries   Recent Labs Lab 06/30/15 1142 07/01/15 0621  NA 135 142  K 4.8 4.4  CL 104 108  CO2 24 27  GLUCOSE 86 90  BUN 11 9  CREATININE 0.68 0.87  CALCIUM 9.0 9.2  AST 21  --   ALT 18  --   ALKPHOS 59  --   BILITOT 0.3   --     Cardiac Enzymes No results for input(s): TROPONINI in the last 168 hours.  Microbiology Results  @[email or web address] @  RADIOLOGY:  Ct Angio Chest Pe W/cm &/or Wo Cm  07/02/2015   CLINICAL DATA:  Chest pain.  EXAM: CT ANGIOGRAPHY CHEST  CT ABDOMEN AND PELVIS WITH CONTRAST  TECHNIQUE: Multidetector CT imaging of the chest was performed using the standard protocol during bolus administration of intravenous contrast. Multiplanar CT image reconstructions and MIPs were obtained to evaluate the vascular anatomy. Multidetector CT imaging of the abdomen and pelvis was performed using the standard protocol during bolus administration of intravenous contrast.  CONTRAST:  OMNIPAQUE IOHEXOL 350 MG/ML SOLN  COMPARISON:  CT scan of June 30, 2015.  FINDINGS: CTA CHEST FINDINGS  No pneumothorax or pleural effusion is noted. No acute pulmonary disease is noted. There is no evidence of pulmonary embolus. There is no evidence of thoracic aortic dissection or aneurysm. No mediastinal mass or adenopathy is noted. No significant osseous abnormality is noted in the chest.  CT ABDOMEN and PELVIS FINDINGS  No gallstones are noted. The liver, spleen and pancreas appear normal. Adrenal glands and kidneys appear normal. No hydronephrosis or renal obstruction is noted. The appendix appears normal. There is no evidence of bowel obstruction. No abnormal fluid collection is noted. Uterus and ovaries appear normal. Urinary bladder appears normal. No significant adenopathy is noted. No significant osseous abnormality is noted in the abdomen or pelvis.  Review of the MIP images confirms the above findings.  IMPRESSION: No evidence of pulmonary embolus. No significant abnormality seen in the chest.  No significant abnormality seen in the abdomen or pelvis.   Electronically Signed   By: Lupita Raider, M.D.   On: 07/02/2015 12:34   Ct Abdomen Pelvis W Contrast  07/02/2015   CLINICAL DATA:  Chest pain.  EXAM: CT ANGIOGRAPHY CHEST   CT ABDOMEN AND PELVIS WITH CONTRAST  TECHNIQUE: Multidetector CT imaging of the chest was performed using the standard protocol during bolus administration of intravenous contrast. Multiplanar CT image reconstructions and MIPs were obtained to evaluate the vascular anatomy. Multidetector CT imaging of the abdomen and pelvis was performed using the standard protocol during bolus administration of intravenous contrast.  CONTRAST:  OMNIPAQUE IOHEXOL 350 MG/ML SOLN  COMPARISON:  CT scan of June 30, 2015.  FINDINGS: CTA CHEST FINDINGS  No pneumothorax or pleural effusion is noted. No acute pulmonary disease is noted. There is no evidence of pulmonary embolus. There is no evidence of thoracic aortic dissection or aneurysm. No mediastinal mass or adenopathy is noted. No significant osseous abnormality is noted in the chest.  CT ABDOMEN and PELVIS FINDINGS  No gallstones are noted. The liver, spleen and pancreas appear normal. Adrenal glands and kidneys appear normal. No hydronephrosis or renal obstruction is noted. The appendix appears normal. There is  no evidence of bowel obstruction. No abnormal fluid collection is noted. Uterus and ovaries appear normal. Urinary bladder appears normal. No significant adenopathy is noted. No significant osseous abnormality is noted in the abdomen or pelvis.  Review of the MIP images confirms the above findings.  IMPRESSION: No evidence of pulmonary embolus. No significant abnormality seen in the chest.  No significant abnormality seen in the abdomen or pelvis.   Electronically Signed   By: Lupita Raider, M.D.   On: 07/02/2015 12:34   Ct Renal Stone Study  06/30/2015   CLINICAL DATA:  Upper abdominal pain since 4 a.m. this morning. She has laparoscopic surgery 2 months ago for uterine adhesion removal.  EXAM: CT ABDOMEN AND PELVIS WITHOUT CONTRAST  TECHNIQUE: Multidetector CT imaging of the abdomen and pelvis was performed following the standard protocol without IV contrast.   COMPARISON:  Right upper quadrant abdomen ultrasound obtained earlier today. Abdomen and pelvis CT dated 05/08/2015.  FINDINGS: Normal non contrasted appearance of the liver, spleen, pancreas, gallbladder, adrenal glands, kidneys, ureters, urinary bladder, uterus and ovaries. No gastrointestinal abnormalities or enlarged lymph nodes. Multiple small appendicoliths. The largest is a 4 mm proximal appendicolith. The appendix is normal in size with no periappendiceal soft tissue stranding. No enlarged lymph nodes. Clear lung bases. Normal appearing bones. Tiny umbilical hernia containing fat. Small pelvic phleboliths.  IMPRESSION: 1. No acute abnormality. 2. Multiple small appendicoliths without evidence of appendicitis. 3. Tiny umbilical hernia containing fat.   Electronically Signed   By: Beckie Salts M.D.   On: 06/30/2015 14:21      Management plans discussed with the patient and she is in agreement. Stable for discharge home  Patient should follow up with PCP in 1 week and GI  CODE STATUS:     Code Status Orders        Start     Ordered   06/30/15 1529  Full code   Continuous     06/30/15 1529      TOTAL TIME TAKING CARE OF THIS PATIENT: 35 minutes.    Sopheap Basic M.D on 07/02/2015 at 12:52 PM  Between 7am to 6pm - Pager - [phone] After 6pm go to [email or web address] - password [email or web address] [facility]  [place] [facility]  Office  [phone]  CC: Primary care physician; Oswaldo Conroy, MD

## 2015-07-02 NOTE — Progress Notes (Signed)
Pt d/c to home today.  IV removed intact. Boyfriend at bedside for escort to home.  Volunteer called for transport out.  Rx's give to pt.  All questions and concerns addressed.

## 2015-07-03 LAB — SURGICAL PATHOLOGY

## 2015-07-07 ENCOUNTER — Encounter: Payer: Self-pay | Admitting: Gastroenterology

## 2015-08-06 ENCOUNTER — Emergency Department
Admission: EM | Admit: 2015-08-06 | Discharge: 2015-08-06 | Disposition: A | Discharge status: Home / Self Care | Payer: Medicaid Other | Attending: Emergency Medicine | Admitting: Emergency Medicine

## 2015-08-06 ENCOUNTER — Emergency Department: Payer: Medicaid Other

## 2015-08-06 DIAGNOSIS — M7918 Myalgia, other site: Secondary | ICD-10-CM

## 2015-08-06 DIAGNOSIS — M25512 Pain in left shoulder: Secondary | ICD-10-CM | POA: Insufficient documentation

## 2015-08-06 DIAGNOSIS — G8929 Other chronic pain: Secondary | ICD-10-CM | POA: Insufficient documentation

## 2015-08-06 DIAGNOSIS — Z79899 Other long term (current) drug therapy: Secondary | ICD-10-CM | POA: Insufficient documentation

## 2015-08-06 DIAGNOSIS — M542 Cervicalgia: Secondary | ICD-10-CM | POA: Diagnosis not present

## 2015-08-06 DIAGNOSIS — Z720 Tobacco use: Secondary | ICD-10-CM | POA: Diagnosis not present

## 2015-08-06 MED ORDER — KETOROLAC TROMETHAMINE 60 MG/2ML IM SOLN
60.0000 mg | Freq: Once | INTRAMUSCULAR | Status: AC
  Administered 2015-08-06: 60 mg via INTRAMUSCULAR
  Filled 2015-08-06: qty 2

## 2015-08-06 MED ORDER — CYCLOBENZAPRINE HCL 10 MG PO TABS
10.0000 mg | ORAL_TABLET | Freq: Once | ORAL | Status: AC
  Administered 2015-08-06: 10 mg via ORAL
  Filled 2015-08-06: qty 1

## 2015-08-06 MED ORDER — MELOXICAM 15 MG PO TABS: 15.0000 mg | ORAL_TABLET | Freq: Every day | ORAL | Status: DC

## 2015-08-06 MED ORDER — CYCLOBENZAPRINE HCL 10 MG PO TABS: 10.0000 mg | ORAL_TABLET | Freq: Three times a day (TID) | ORAL | Status: DC | PRN

## 2015-08-06 NOTE — ED Notes (Signed)
Patient transported to X-ray 

## 2015-08-06 NOTE — ED Provider Notes (Signed)
[name] ____________________________________________  Time seen: Approximately 8:11 PM  I have reviewed the triage vital signs and the nursing notes.   HISTORY  Chief Complaint Shoulder Pain   HPI Mandy Hansen is a 24 y.o. female who presents to the emergency department for evaluation of left shoulder pain. Pain is chronic, but worse over the past 2 days. No relief with tylenol and massage. No injury.   Past Medical History  Diagnosis Date  . Dysplasia of cervix 2013    dyplasia has resolved since birth of son in 2013.  [name] Psoriasis of scalp 2004    mainly scalp, uses sun for treatment    Patient Active Problem List   Diagnosis Date Noted  . Epigastric pain 06/30/2015    Past Surgical History  Procedure Laterality Date  . Cesarean section    . Cesarean section N/A 2013  . Laparoscopy N/A 05/23/2015    Procedure: LAPAROSCOPY DIAGNOSTIC;  Surgeon: Nadara Mustard, MD;  Location: [facility] ORS;  Service: Gynecology;  Laterality: N/A;  . Cystoscopy N/A 05/23/2015    Procedure: CYSTOSCOPY;  Surgeon: Nadara Mustard, MD;  Location: [facility] ORS;  Service: Gynecology;  Laterality: N/A;  . Esophagogastroduodenoscopy N/A 07/02/2015    Procedure: ESOPHAGOGASTRODUODENOSCOPY (EGD) looking in the esophagus stomach and upper small intestine with a lighted tube to evaluate and treat;  Surgeon: Christena Deem, MD;  Location: [facility] ENDOSCOPY;  Service: Endoscopy;  Laterality: N/A;    Current Outpatient Rx  Name  Route  Sig  Dispense  Refill  . acetaminophen (TYLENOL) 325 MG tablet   Oral   Take 650 mg by mouth every 4 (four) hours as needed for mild pain, moderate pain, fever or headache.          . Calcium Carb-Cholecalciferol (CALCIUM 600-D PO)   Oral   Take 1 tablet by mouth daily at 12 noon.         . Iron TABS   Oral   Take 1 tablet by mouth daily at 12 noon.         . medroxyPROGESTERone (DEPO-PROVERA) 150 MG/ML  injection   Intramuscular   Inject 1 mL into the muscle every 3 (three) months.         [name] oxyCODONE (OXY IR/ROXICODONE) 5 MG immediate release tablet   Oral   Take 1 tablet (5 mg total) by mouth every 4 (four) hours as needed for moderate pain.   30 tablet   0   . pantoprazole (PROTONIX) 40 MG tablet   Oral   Take 1 tablet (40 mg total) by mouth daily.   60 tablet   0     Allergies Review of patient's allergies indicates no known allergies.  No family history on file.  Social History Social History  Substance Use Topics  . Smoking status: Current Every Day Smoker -- 0.50 packs/day    Types: Cigarettes  . Smokeless tobacco: Not on file  . Alcohol Use: No    Review of Systems Constitutional: No recent illness. Eyes: No visual changes. ENT: No sore throat. Cardiovascular: Denies chest pain or palpitations. Respiratory: Denies shortness of breath. Gastrointestinal: No abdominal pain.  Genitourinary: Negative for dysuria. Musculoskeletal: Pain in left neck and shoulder that radiates into the left elbow. Skin: Negative for rash. Neurological: Negative for headaches, focal weakness or numbness. 10-point ROS otherwise negative.  ____________________________________________   PHYSICAL EXAM:  VITAL SIGNS: ED Triage Vitals  Enc Vitals Group  BP 08/06/15 1935 150/88 mmHg     Pulse Rate 08/06/15 1935 108     Resp 08/06/15 1935 18     Temp 08/06/15 1935 98.2 F (36.8 C)     Temp src --      SpO2 08/06/15 1935 98 %     Weight 08/06/15 1935 140 lb (63.504 kg)     Height 08/06/15 1935 5\' 3"  (1.6 m)     Head Cir --      Peak Flow --      Pain Score 08/06/15 1936 8     Pain Loc --      Pain Edu? --      Excl. in GC? --    Constitutional: Alert and oriented. Well appearing and in no acute distress. Eyes: Conjunctivae are normal. EOMI. Head: Atraumatic. Nose: No congestion/rhinnorhea. Neck: No stridor.  Respiratory: Normal respiratory effort.    Musculoskeletal: Limited ROM of left shoulder due to pain; tenderness diffuse over trapezius on the left;  Neurologic:  Normal speech and language. No gross focal neurologic deficits are appreciated. Speech is normal. No gait instability. Skin:  Skin is warm, dry and intact. Atraumatic. Psychiatric: Mood and affect are normal. Speech and behavior are normal.  ____________________________________________   LABS (all labs ordered are listed, but only abnormal results are displayed)  Labs Reviewed - No data to display ____________________________________________  RADIOLOGY  No bony abnormality of the cervical spine or left shoulder. ____________________________________________   PROCEDURES  Procedure(s) performed: None   ____________________________________________   INITIAL IMPRESSION / ASSESSMENT AND PLAN / ED COURSE  Pertinent labs & imaging results that were available during my care of the patient were reviewed by me and considered in my medical decision making (see chart for details).  Patient was advised to follow up with orthopedics. She was advised to return to the ER for symptoms that change or worsen or for new concerns if unable to schedule an appointment with PCP or specialist. ____________________________________________   FINAL CLINICAL IMPRESSION(S) / ED DIAGNOSES  Final diagnoses:  None       [name], FNP 08/06/15 2248  [name], MD 08/06/15 [phone]

## 2015-08-06 NOTE — Discharge Instructions (Signed)

## 2015-08-06 NOTE — ED Notes (Addendum)
Pt in with co pain to left shoulder and neck  for years states has seen doctor in the past and they told it was due to tension.

## 2015-08-06 NOTE — ED Notes (Signed)
Pt returned from xray

## 2015-08-06 NOTE — ED Notes (Addendum)
Tri[place], NP at bedside

## 2015-08-10 ENCOUNTER — Emergency Department: Payer: Medicaid Other

## 2015-08-10 ENCOUNTER — Emergency Department
Admission: EM | Admit: 2015-08-10 | Discharge: 2015-08-10 | Disposition: A | Discharge status: Home / Self Care | Payer: Medicaid Other | Attending: Emergency Medicine | Admitting: Emergency Medicine

## 2015-08-10 ENCOUNTER — Encounter: Payer: Self-pay | Admitting: Emergency Medicine

## 2015-08-10 DIAGNOSIS — Z79899 Other long term (current) drug therapy: Secondary | ICD-10-CM | POA: Insufficient documentation

## 2015-08-10 DIAGNOSIS — R112 Nausea with vomiting, unspecified: Secondary | ICD-10-CM | POA: Insufficient documentation

## 2015-08-10 DIAGNOSIS — F17210 Nicotine dependence, cigarettes, uncomplicated: Secondary | ICD-10-CM | POA: Insufficient documentation

## 2015-08-10 DIAGNOSIS — Z791 Long term (current) use of non-steroidal anti-inflammatories (NSAID): Secondary | ICD-10-CM | POA: Diagnosis not present

## 2015-08-10 DIAGNOSIS — R000 Tachycardia, unspecified: Secondary | ICD-10-CM | POA: Diagnosis not present

## 2015-08-10 DIAGNOSIS — Z3202 Encounter for pregnancy test, result negative: Secondary | ICD-10-CM | POA: Insufficient documentation

## 2015-08-10 DIAGNOSIS — R109 Unspecified abdominal pain: Secondary | ICD-10-CM

## 2015-08-10 DIAGNOSIS — R1032 Left lower quadrant pain: Secondary | ICD-10-CM | POA: Insufficient documentation

## 2015-08-10 LAB — CBC WITH DIFFERENTIAL/PLATELET
Basophils Absolute: 0 10*3/uL (ref 0–0.1)
Basophils Relative: 0 %
EOS PCT: 1 %
Eosinophils Absolute: 0.1 10*3/uL (ref 0–0.7)
HEMATOCRIT: 42.6 % (ref 35.0–47.0)
Hemoglobin: 13.8 g/dL (ref 12.0–16.0)
LYMPHS ABS: 1.1 10*3/uL (ref 1.0–3.6)
LYMPHS PCT: 10 %
MCH: 30.3 pg (ref 26.0–34.0)
MCHC: 32.4 g/dL (ref 32.0–36.0)
MCV: 93.4 fL (ref 80.0–100.0)
MONO ABS: 0.5 10*3/uL (ref 0.2–0.9)
Monocytes Relative: 4 %
NEUTROS ABS: 9.6 10*3/uL — AB (ref 1.4–6.5)
Neutrophils Relative %: 85 %
PLATELETS: 229 10*3/uL (ref 150–440)
RBC: 4.56 MIL/uL (ref 3.80–5.20)
RDW: 13.1 % (ref 11.5–14.5)
WBC: 11.3 10*3/uL — AB (ref 3.6–11.0)

## 2015-08-10 LAB — COMPREHENSIVE METABOLIC PANEL
ALK PHOS: 60 U/L (ref 38–126)
ALT: 18 U/L (ref 14–54)
AST: 24 U/L (ref 15–41)
Albumin: 4.3 g/dL (ref 3.5–5.0)
Anion gap: 5 (ref 5–15)
BILIRUBIN TOTAL: 0.5 mg/dL (ref 0.3–1.2)
BUN: 14 mg/dL (ref 6–20)
CHLORIDE: 113 mmol/L — AB (ref 101–111)
CO2: 23 mmol/L (ref 22–32)
CREATININE: 0.65 mg/dL (ref 0.44–1.00)
Calcium: 9.2 mg/dL (ref 8.9–10.3)
Glucose, Bld: 107 mg/dL — ABNORMAL HIGH (ref 65–99)
Potassium: 4.1 mmol/L (ref 3.5–5.1)
Sodium: 141 mmol/L (ref 135–145)
Total Protein: 6.9 g/dL (ref 6.5–8.1)

## 2015-08-10 LAB — LIPASE, BLOOD: LIPASE: 27 U/L (ref 11–51)

## 2015-08-10 LAB — URINALYSIS COMPLETE WITH MICROSCOPIC (ARMC ONLY)
Bacteria, UA: NONE SEEN
Bilirubin Urine: NEGATIVE
GLUCOSE, UA: NEGATIVE mg/dL
Hgb urine dipstick: NEGATIVE
KETONES UR: NEGATIVE mg/dL
Leukocytes, UA: NEGATIVE
NITRITE: NEGATIVE
Protein, ur: NEGATIVE mg/dL
SPECIFIC GRAVITY, URINE: 1.027 (ref 1.005–1.030)
pH: 6 (ref 5.0–8.0)

## 2015-08-10 LAB — POCT PREGNANCY, URINE: Preg Test, Ur: NEGATIVE

## 2015-08-10 MED ORDER — HYDROCODONE-ACETAMINOPHEN 5-325 MG PO TABS: 1.0000 | ORAL_TABLET | Freq: Four times a day (QID) | ORAL | Status: DC | PRN

## 2015-08-10 MED ORDER — IOHEXOL 300 MG/ML  SOLN
100.0000 mL | Freq: Once | INTRAMUSCULAR | Status: AC | PRN
  Administered 2015-08-10: 100 mL via INTRAVENOUS

## 2015-08-10 MED ORDER — ONDANSETRON HCL 4 MG/2ML IJ SOLN
4.0000 mg | Freq: Once | INTRAMUSCULAR | Status: AC
  Administered 2015-08-10: 4 mg via INTRAVENOUS
  Filled 2015-08-10: qty 2

## 2015-08-10 MED ORDER — IOHEXOL 240 MG/ML SOLN
25.0000 mL | Freq: Once | INTRAMUSCULAR | Status: AC | PRN
  Administered 2015-08-10: 25 mL via ORAL

## 2015-08-10 MED ORDER — SODIUM CHLORIDE 0.9 % IV BOLUS (SEPSIS)
1000.0000 mL | Freq: Once | INTRAVENOUS | Status: AC
  Administered 2015-08-10: 1000 mL via INTRAVENOUS

## 2015-08-10 MED ORDER — MORPHINE SULFATE (PF) 4 MG/ML IV SOLN
4.0000 mg | Freq: Once | INTRAVENOUS | Status: AC
  Administered 2015-08-10: 4 mg via INTRAVENOUS
  Filled 2015-08-10: qty 1

## 2015-08-10 MED ORDER — ONDANSETRON 4 MG PO TBDP: 4.0000 mg | ORAL_TABLET | Freq: Four times a day (QID) | ORAL | Status: DC | PRN

## 2015-08-10 NOTE — ED Notes (Signed)
C/o LLQ abd. Pain x 3 days, states she has been having pain off and on since August when she had laparoscopic abd. Surgery for endometrosis, also have n,v since yesterday

## 2015-08-10 NOTE — Discharge Instructions (Signed)

## 2015-08-10 NOTE — ED Provider Notes (Signed)
[facility] Emergency Department Provider Note REMINDER - THIS NOTE IS NOT A FINAL MEDICAL RECORD UNTIL IT IS SIGNED. UNTIL THEN, THE CONTENT BELOW MAY REFLECT INFORMATION FROM A DOCUMENTATION TEMPLATE, NOT THE ACTUAL PATIENT VISIT. ____________________________________________  Time seen: Approximately 10:01 AM  I have reviewed the triage vital signs and the nursing notes.   HISTORY  Chief Complaint Abdominal Pain    HPI Mandy Hansen is a 24 y.o. female presents for evaluation of left lower quadrant pain with severe nausea and vomiting and numerous times for the last 2 days. She reports this came on suddenly about 3 days ago and she been unable to keep down any food. She is frequently vomiting up liquid and having sharp crampy pain in the left lower abdomen.  She denies black or bloody emesis. She had a small bowel movement yesterday. She denies pregnancy, no vaginal bleeding, she is not a period for over 2 years due to Depo-Provera. No vaginal discharge or "pelvic pain". She reports just can't keep any food down she feels terrible, nauseated, and is having pain around the area she had a laparoscopy and some sort of lysis of adhesions or otherwise by Dr. Tiburcio Pea about 2 years ago.  Patient also recently admitted for upper abdominal pain which is different, she reports she had a scope done that was normal.  Past Medical History  Diagnosis Date  . Dysplasia of cervix 2013    dyplasia has resolved since birth of son in 2013.  [name] Psoriasis of scalp 2004    mainly scalp, uses sun for treatment    Patient Active Problem List   Diagnosis Date Noted  . Epigastric pain 06/30/2015    Past Surgical History  Procedure Laterality Date  . Cesarean section    . Cesarean section N/A 2013  . Laparoscopy N/A 05/23/2015    Procedure: LAPAROSCOPY DIAGNOSTIC;  Surgeon: Nadara Mustard, MD;  Location: [facility] ORS;  Service: Gynecology;  Laterality: N/A;  . Cystoscopy N/A  05/23/2015    Procedure: CYSTOSCOPY;  Surgeon: Nadara Mustard, MD;  Location: [facility] ORS;  Service: Gynecology;  Laterality: N/A;  . Esophagogastroduodenoscopy N/A 07/02/2015    Procedure: ESOPHAGOGASTRODUODENOSCOPY (EGD) looking in the esophagus stomach and upper small intestine with a lighted tube to evaluate and treat;  Surgeon: Christena Deem, MD;  Location: [facility] END[facility]COPY;  Service: Endoscopy;  Laterality: N/A;  . Abdominal surgery      Current Outpatient Rx  Name  Route  Sig  Dispense  Refill  . acetaminophen (TYLENOL) 325 MG tablet   Oral   Take 650 mg by mouth every 4 (four) hours as needed for mild pain, moderate pain, fever or headache.          . Calcium Carb-Cholecalciferol (CALCIUM 600-D PO)   Oral   Take 1 tablet by mouth daily at 12 noon.         . ferrous sulfate 325 (65 FE) MG tablet   Oral   Take 325 mg by mouth daily.         . medroxyPROGESTERone (DEPO-PROVERA) 150 MG/ML injection   Intramuscular   Inject 1 mL into the muscle every 3 (three) months.         . Melatonin 5 MG TABS   Oral   Take 5 mg by mouth at bedtime as needed (for sleep).         . meloxicam (MOBIC) 15 MG tablet   Oral   Take 1 tablet (15  mg total) by mouth daily.   30 tablet   0   . pantoprazole (PROTONIX) 40 MG tablet   Oral   Take 1 tablet (40 mg total) by mouth daily.   60 tablet   0   . cyclobenzaprine (FLEXERIL) 10 MG tablet   Oral   Take 1 tablet (10 mg total) by mouth 3 (three) times daily as needed for muscle spasms.   30 tablet   0   . HYDROcodone-acetaminophen (NORCO/VICODIN) 5-325 MG tablet   Oral   Take 1 tablet by mouth every 6 (six) hours as needed for moderate pain.   10 tablet   0   . ondansetron (ZOFRAN ODT) 4 MG disintegrating tablet   Oral   Take 1 tablet (4 mg total) by mouth every 6 (six) hours as needed for nausea or vomiting.   20 tablet   0   . oxyCODONE (OXY IR/ROXICODONE) 5 MG immediate release tablet   Oral   Take 1 tablet (5 mg  total) by mouth every 4 (four) hours as needed for moderate pain.   30 tablet   0     Allergies Review of patient's allergies indicates no known allergies.  No family history on file.  Social History Social History  Substance Use Topics  . Smoking status: Current Every Day Smoker -- 0.50 packs/day    Types: Cigarettes  . Smokeless tobacco: None  . Alcohol Use: No    Review of Systems Constitutional: No fever/chills Eyes: No visual changes. ENT: No sore throat. Cardiovascular: Denies chest pain. Respiratory: Denies shortness of breath. Gastrointestinal: No constipation. Genitourinary: Negative for dysuria. Denies pregnancy. Musculoskeletal: Negative for back pain. Skin: Negative for rash. Neurological: Negative for headaches, focal weakness or numbness.  10-point ROS otherwise negative.  ____________________________________________   PHYSICAL EXAM:  VITAL SIGNS: ED Triage Vitals  Enc Vitals Group     BP 08/10/15 0930 127/92 mmHg     Pulse Rate 08/10/15 0930 134     Resp 08/10/15 0930 18     Temp 08/10/15 0930 98.9 F (37.2 C)     Temp Source 08/10/15 0930 Oral     SpO2 08/10/15 0930 98 %     Weight 08/10/15 0930 140 lb (63.504 kg)     Height 08/10/15 0930  (1.6 m)     Head Cir --      Peak Flow --      Pain Score 08/10/15 0931 7     Pain Loc --      Pain Edu? --      Excl. in GC? --    Constitutional: Alert and oriented. Somewhat mildly ill-appearing and in no acute distress. Eyes: Conjunctivae are normal. PERRL. EOMI. Head: Atraumatic. Nose: No congestion/rhinnorhea. Mouth/Throat: Mucous membranes are very dry.  Oropharynx non-erythematous. Neck: No stridor.   Cardiovascular: Tachycardic rate, regular rhythm. Grossly normal heart sounds.  Good peripheral circulation. Respiratory: Normal respiratory effort.  No retractions. Lungs CTAB. Gastrointestinal: Soft and nontender except for moderate focal tenderness along the left lower quadrant without  rebound or guarding. No distention. No abdominal bruits. No CVA tenderness. Musculoskeletal: No lower extremity tenderness nor edema.  No joint effusions. Neurologic:  Normal speech and language. No gross focal neurologic deficits are appreciated. No gait instability. Skin:  Skin is warm, dry and intact. No rash noted. Psychiatric: Mood and affect are normal. Speech and behavior are normal.  ____________________________________________   LABS (all labs ordered are listed, but only abnormal results are displayed)  Labs Reviewed  URINALYSIS COMPLETEWITH MICROSCOPIC ([facility] ONLY) - Abnormal; Notable for the following:    Color, Urine YELLOW (*)    APPearance HAZY (*)    Squamous Epithelial / LPF 0-5 (*)    All other components within normal limits  CBC WITH DIFFERENTIAL/PLATELET - Abnormal; Notable for the following:    WBC 11.3 (*)    Neutro Abs 9.6 (*)    All other components within normal limits  COMPREHENSIVE METABOLIC PANEL - Abnormal; Notable for the following:    Chloride 113 (*)    Glucose, Bld 107 (*)    All other components within normal limits  CHLAMYDIA/NGC RT PCR ([facility] ONLY)  LIPASE, BLOOD  POCT PREGNANCY, URINE   ____________________________________________  EKG   ____________________________________________  RADIOLOGY  US Transvaginal Non-OB (Final result) Result time: 08/10/15 12:36:38   Final result by Rad Results In Interface (08/10/15 12:36:38)   Narrative:   CLINICAL DATA: Acute lower abdominal pelvic pain for 3 days with vomiting.  EXAM: TRANSABDOMINAL AND TRANSVAGINAL ULTRASOUND OF PELVIS  DOPPLER ULTRASOUND OF OVARIES  TECHNIQUE: Both transabdominal and transvaginal ultrasound examinations of the pelvis were performed. Transabdominal technique was performed for global imaging of the pelvis including uterus, ovaries, adnexal regions, and pelvic cul-de-sac.  It was necessary to proceed with endovaginal exam following  the transabdominal exam to visualize the uterus, adnexal and ovaries in better detail. Color and duplex Doppler ultrasound was utilized to evaluate blood flow to the ovaries.  COMPARISON: None.  FINDINGS: Uterus  Measurements: 7.4 x 2.6 x 1.4 cm. No fibroid or mass evident. Prominent peripheral uterine vasculature. This is nonspecific.  Endometrium  Thickness: 4 mm. No focal abnormality visualized.  Right ovary  Measurements: 2.0 x 1.8 x 2.2 cm. Normal appearance/no adnexal mass.  Left ovary  Measurements: 3.4 x 1.7 x 2.4 cm. Normal appearance/no adnexal mass.  Pulsed Doppler evaluation of both ovaries demonstrates normal low-resistance arterial and venous waveforms.  Other findings  No free fluid.  IMPRESSION: No acute finding by pelvic ultrasound. Nonspecific prominent peripheral uterine vasculature.  No adnexal or ovarian abnormality. Negative for free fluid.   Electronically Signed By: Judie Petit. Shick M.D. On: 08/10/2015 12:36          [place] Art/Ven Flow Abd Pelv Doppler (Final result) Result time: 08/10/15 12:36:38   Final result by Rad Results In Interface (08/10/15 12:36:38)   Narrative:   CLINICAL DATA: Acute lower abdominal pelvic pain for 3 days with vomiting.  EXAM: TRANSABDOMINAL AND TRANSVAGINAL ULTRASOUND OF PELVIS  DOPPLER ULTRASOUND OF OVARIES  TECHNIQUE: Both transabdominal and transvaginal ultrasound examinations of the pelvis were performed. Transabdominal technique was performed for global imaging of the pelvis including uterus, ovaries, adnexal regions, and pelvic cul-de-sac.  It was necessary to proceed with endovaginal exam following the transabdominal exam to visualize the uterus, adnexal and ovaries in better detail. Color and duplex Doppler ultrasound was utilized to evaluate blood flow to the ovaries.  COMPARISON: None.  FINDINGS: Uterus  Measurements: 7.4 x 2.6 x 1.4 cm. No fibroid or mass evident. Prominent  peripheral uterine vasculature. This is nonspecific.  Endometrium  Thickness: 4 mm. No focal abnormality visualized.  Right ovary  Measurements: 2.0 x 1.8 x 2.2 cm. Normal appearance/no adnexal mass.  Left ovary  Measurements: 3.4 x 1.7 x 2.4 cm. Normal appearance/no adnexal mass.  Pulsed Doppler evaluation of both ovaries demonstrates normal low-resistance arterial and venous waveforms.  Other findings  No free fluid.  IMPRESSION: No acute finding by pelvic ultrasound. Nonspecific  prominent peripheral uterine vasculature.  No adnexal or ovarian abnormality. Negative for free fluid.   Electronically Signed By: Judie PetitM. Shick M.D. On: 08/10/2015 12:36          US Pelvis Complete (Final result) Result time: 08/10/15 12:36:38   Final result by Rad Results In Interface (08/10/15 12:36:38)   Narrative:   CLINICAL DATA: Acute lower abdominal pelvic pain for 3 days with vomiting.  EXAM: TRANSABDOMINAL AND TRANSVAGINAL ULTRASOUND OF PELVIS  DOPPLER ULTRASOUND OF OVARIES  TECHNIQUE: Both transabdominal and transvaginal ultrasound examinations of the pelvis were performed. Transabdominal technique was performed for global imaging of the pelvis including uterus, ovaries, adnexal regions, and pelvic cul-de-sac.  It was necessary to proceed with endovaginal exam following the transabdominal exam to visualize the uterus, adnexal and ovaries in better detail. Color and duplex Doppler ultrasound was utilized to evaluate blood flow to the ovaries.  COMPARISON: None.  FINDINGS: Uterus  Measurements: 7.4 x 2.6 x 1.4 cm. No fibroid or mass evident. Prominent peripheral uterine vasculature. This is nonspecific.  Endometrium  Thickness: 4 mm. No focal abnormality visualized.  Right ovary  Measurements: 2.0 x 1.8 x 2.2 cm. Normal appearance/no adnexal mass.  Left ovary  Measurements: 3.4 x 1.7 x 2.4 cm. Normal appearance/no adnexal mass.  Pulsed Doppler  evaluation of both ovaries demonstrates normal low-resistance arterial and venous waveforms.  Other findings  No free fluid.  IMPRESSION: No acute finding by pelvic ultrasound. Nonspecific prominent peripheral uterine vasculature.  No adnexal or ovarian abnormality. Negative for free fluid.   Electronically Signed By: Judie PetitM. Shick M.D. On: 08/10/2015 12:36          CT Abdomen Pelvis W Contrast (Final result) Result time: 08/10/15 11:33:24   Final result by Rad Results In Interface (08/10/15 11:33:24)   Narrative:   CLINICAL DATA: Left lower quadrant abdominal pain for 3 days, pain off and on since August when she had laparoscopic abdominal surgery for endometriosis. Also with nausea and vomiting since yesterday peer  EXAM: CT ABDOMEN AND PELVIS WITH CONTRAST  TECHNIQUE: Multidetector CT imaging of the abdomen and pelvis was performed using the standard protocol following bolus administration of intravenous contrast.  CONTRAST: 100mL OMNIPAQUE IOHEXOL 300 MG/ML SOLN  COMPARISON: None.  FINDINGS: Lower chest: Normal  Hepatobiliary: No masses or other significant abnormality.  Pancreas: No mass, inflammatory changes, or other significant abnormality.  Spleen: Within normal limits in size and appearance.  Adrenals/Urinary Tract: No masses identified. No evidence of hydronephrosis. No renal or ureteral calculi identified. Bladder is unremarkable.  Stomach/Bowel: Bowel is normal in caliber. No bowel wall thickening or evidence of bowel wall inflammation seen. Appendix is normal.  Vascular/Lymphatic: Abdominal aorta is normal in caliber. No vascular abnormality identified. No enlarged lymph nodes identified in the abdomen or pelvis.  Reproductive: Uterus and adnexal regions are unremarkable.  Other: No free fluid or abscess collection seen. No soft tissue mass. No free intraperitoneal air.  Musculoskeletal: No osseous abnormality seen.  Superficial soft tissues are unremarkable.  IMPRESSION: Normal abdomen and pelvis CT. No source for left lower quadrant pain identified.      ____________________________________________   PROCEDURES  Procedure(s) performed: None  Critical Care performed: No  ____________________________________________   INITIAL IMPRESSION / ASSESSMENT AND PLAN / ED COURSE  Pertinent labs & imaging results that were available during my care of the patient were reviewed by me and considered in my medical decision making (see chart for details).  She presents for evaluation of severe nausea and vomiting  associated left lower quadrant pain. Given her previous surgery, she would be at slightly increased risk of bowel obstruction given her inability to tolerate fluids as well as significant tachycardia or hydroureter generously, provide pain medication, and feel that we need to pursue CT scan to evaluate and rule out acute intra-abdominal pathology. In addition, the patient does have associated left lower quadrant pain and will obtain ultrasound to verify that there is no ovarian process or cyst present. She denies any vaginal discharge or symptoms of gynecologic infection, I will screen using a urine GC, though at this point I doubt pelvic infection given her symptomatology and reported symptoms. She may just have significant gastritis, gastroenteritis, or other considerations would be bowel obstruction, diverticulitis, ovarian process, etc.  ----------------------------------------- 3:08 PM on 08/10/2015 -----------------------------------------  Extensive evaluation is negative for cause an acute condition to explain the patient's nausea and emesis, however she reports her pain is much better her heart rate is improved and she is tolerating by mouth. I'll discharge her with a brief restriction for pain medication, for which I have discussed the risks and benefits of its use. In addition, I'll give her  an antiemetic and advise close follow-up with her primary care doctor. Patient indicates that she understands the plan of care and return precautions.  I will prescribe the patient a narcotic pain medicine due to their condition which I anticipate will cause at least moderate pain short term. I discussed with the patient safe use of narcotic pain medicines, and that they are not to drive, work in dangerous areas, or ever take more than prescribed (no more than 1 pill every 6 hours). We discussed that this is the type of medication that "Criss Alvine" may have overdosed on and the risks of this type of medicine. Patient is very agreeable to only use as prescribed and to never use more than prescribed.  ____________________________________________   FINAL CLINICAL IMPRESSION(S) / ED DIAGNOSES  Final diagnoses:  Recurrent abdominal pain      Sharyn Creamer, MD 08/10/15 [phone]

## 2015-08-10 NOTE — ED Notes (Signed)
Pt back from US

## 2015-08-10 NOTE — ED Notes (Signed)
Pt to CT/Ultrasound

## 2015-10-13 ENCOUNTER — Encounter: Payer: Self-pay | Admitting: Emergency Medicine

## 2015-10-13 ENCOUNTER — Emergency Department
Admission: EM | Admit: 2015-10-13 | Discharge: 2015-10-13 | Disposition: A | Discharge status: Home / Self Care | Payer: Medicaid Other | Attending: Emergency Medicine | Admitting: Emergency Medicine

## 2015-10-13 DIAGNOSIS — Z79899 Other long term (current) drug therapy: Secondary | ICD-10-CM | POA: Diagnosis not present

## 2015-10-13 DIAGNOSIS — F17210 Nicotine dependence, cigarettes, uncomplicated: Secondary | ICD-10-CM | POA: Diagnosis not present

## 2015-10-13 DIAGNOSIS — K0889 Other specified disorders of teeth and supporting structures: Secondary | ICD-10-CM | POA: Diagnosis present

## 2015-10-13 DIAGNOSIS — K029 Dental caries, unspecified: Secondary | ICD-10-CM

## 2015-10-13 DIAGNOSIS — K0381 Cracked tooth: Secondary | ICD-10-CM | POA: Insufficient documentation

## 2015-10-13 DIAGNOSIS — Z791 Long term (current) use of non-steroidal anti-inflammatories (NSAID): Secondary | ICD-10-CM | POA: Diagnosis not present

## 2015-10-13 MED ORDER — HYDROCODONE-ACETAMINOPHEN 5-325 MG PO TABS: 1.0000 | ORAL_TABLET | ORAL | Status: DC | PRN

## 2015-10-13 MED ORDER — IBUPROFEN 800 MG PO TABS: 800.0000 mg | ORAL_TABLET | Freq: Three times a day (TID) | ORAL | Status: DC | PRN

## 2015-10-13 MED ORDER — HYDROCODONE-ACETAMINOPHEN 5-325 MG PO TABS
2.0000 | ORAL_TABLET | Freq: Once | ORAL | Status: AC
  Administered 2015-10-13: 2 via ORAL
  Filled 2015-10-13: qty 2

## 2015-10-13 MED ORDER — AMOXICILLIN 500 MG PO TABS: 500.0000 mg | ORAL_TABLET | Freq: Two times a day (BID) | ORAL | Status: DC

## 2015-10-13 MED ORDER — KETOROLAC TROMETHAMINE 60 MG/2ML IM SOLN
60.0000 mg | Freq: Once | INTRAMUSCULAR | Status: AC
  Administered 2015-10-13: 60 mg via INTRAMUSCULAR
  Filled 2015-10-13: qty 2

## 2015-10-13 NOTE — ED Notes (Signed)
Pt reports upper right dental pain since Saturday.

## 2015-10-13 NOTE — ED Provider Notes (Signed)
[facility] Emergency Department Provider Note  ____________________________________________  Time seen: Approximately 1:22 PM  I have reviewed the triage vital signs and the nursing notes.   HISTORY  Chief Complaint Dental Pain    HPI Mandy Hansen is a 25 y.o. female presents for evaluation of dental abscess/pain right upper molars.   Past Medical History  Diagnosis Date  . Dysplasia of cervix 2013    dyplasia has resolved since birth of son in 2013.  [name] Psoriasis of scalp 2004    mainly scalp, uses sun for treatment    Patient Active Problem List   Diagnosis Date Noted  . Epigastric pain 06/30/2015    Past Surgical History  Procedure Laterality Date  . Cesarean section    . Cesarean section N/A 2013  . Laparoscopy N/A 05/23/2015    Procedure: LAPAROSCOPY DIAGNOSTIC;  Surgeon: Nadara Mustard, MD;  Location: [facility] ORS;  Service: Gynecology;  Laterality: N/A;  . Cystoscopy N/A 05/23/2015    Procedure: CYSTOSCOPY;  Surgeon: Nadara Mustard, MD;  Location: [facility] ORS;  Service: Gynecology;  Laterality: N/A;  . Esophagogastroduodenoscopy N/A 07/02/2015    Procedure: ESOPHAGOGASTRODUODENOSCOPY (EGD) looking in the esophagus stomach and upper small intestine with a lighted tube to evaluate and treat;  Surgeon: Christena Deem, MD;  Location: [facility] ENDOSCOPY;  Service: Endoscopy;  Laterality: N/A;  . Abdominal surgery      Current Outpatient Rx  Name  Route  Sig  Dispense  Refill  . acetaminophen (TYLENOL) 325 MG tablet   Oral   Take 650 mg by mouth every 4 (four) hours as needed for mild pain, moderate pain, fever or headache.          [name] amoxicillin (AMOXIL) 500 MG tablet   Oral   Take 1 tablet (500 mg total) by mouth 2 (two) times daily.   20 tablet   0   . Calcium Carb-Cholecalciferol (CALCIUM 600-D PO)   Oral   Take 1 tablet by mouth daily at 12 noon.         . ferrous sulfate 325 (65 FE) MG tablet   Oral   Take 325 mg by mouth  daily.         [name] HYDROcodone-acetaminophen (NORCO) 5-325 MG tablet   Oral   Take 1-2 tablets by mouth every 4 (four) hours as needed for moderate pain.   15 tablet   0   . ibuprofen (ADVIL,MOTRIN) 800 MG tablet   Oral   Take 1 tablet (800 mg total) by mouth every 8 (eight) hours as needed.   30 tablet   0   . medroxyPROGESTERone (DEPO-PROVERA) 150 MG/ML injection   Intramuscular   Inject 1 mL into the muscle every 3 (three) months.         . Melatonin 5 MG TABS   Oral   Take 5 mg by mouth at bedtime as needed (for sleep).         . meloxicam (MOBIC) 15 MG tablet   Oral   Take 1 tablet (15 mg total) by mouth daily.   30 tablet   0   . ondansetron (ZOFRAN ODT) 4 MG disintegrating tablet   Oral   Take 1 tablet (4 mg total) by mouth every 6 (six) hours as needed for nausea or vomiting.   20 tablet   0   . pantoprazole (PROTONIX) 40 MG tablet   Oral   Take 1 tablet (40 mg total) by mouth daily.  60 tablet   0     Allergies Review of patient's allergies indicates no known allergies.  No family history on file.  Social History Social History  Substance Use Topics  . Smoking status: Current Every Day Smoker -- 0.50 packs/day    Types: Cigarettes  . Smokeless tobacco: None  . Alcohol Use: No    Review of Systems Constitutional: No fever/chills Eyes: No visual changes. ENT: No sore throat. Positive for dental pain. Cardiovascular: Denies chest pain. Respiratory: Denies shortness of breath. Gastrointestinal: No abdominal pain.  No nausea, no vomiting.  No diarrhea.  No constipation. Genitourinary: Negative for dysuria. Musculoskeletal: Negative for back pain. Skin: Negative for rash. Neurological: Negative for headaches, focal weakness or numbness.  10-point ROS otherwise negative.  ____________________________________________   PHYSICAL EXAM: BP 121/73 mmHg  Pulse 97  Temp(Src) 98 F (36.7 C) (Oral)  Resp 16  Ht 5\' 3"  (1.6 m)  Wt 63.504 kg   BMI 24.81 kg/m2  SpO2 99%  VITAL SIGNS: ED Triage Vitals  Enc Vitals Group     BP --      Pulse --      Resp --      Temp --      Temp src --      SpO2 --      Weight --      Height --      Head Cir --      Peak Flow --      Pain Score --      Pain Loc --      Pain Edu? --      Excl. in GC? --     Constitutional: Alert and oriented. Well appearing and in no acute distress. Eyes: Conjunctivae are normal. PERRL. EOMI. Head: Atraumatic. Nose: No congestion/rhinnorhea. Mouth/Throat: Mucous membranes are moist.  Oropharynx non-erythematous. Obvious dental caries right upper molar. Fractured tooth noted as well. Neck: No stridor.   Cardiovascular: Normal rate, regular rhythm. Grossly normal heart sounds.  Good peripheral circulation. Respiratory: Normal respiratory effort.  No retractions. Lungs CTAB. Musculoskeletal: No lower extremity tenderness nor edema.  No joint effusions. Neurologic:  Normal speech and language. No gross focal neurologic deficits are appreciated. No gait instability. Skin:  Skin is warm, dry and intact. No rash noted. Psychiatric: Mood and affect are normal. Speech and behavior are normal.  ____________________________________________   LABS (all labs ordered are listed, but only abnormal results are displayed)  Labs Reviewed - No data to display ____________________________________________    PROCEDURES  Procedure(s) performed: None  Critical Care performed: No  ____________________________________________   INITIAL IMPRESSION / ASSESSMENT AND PLAN / ED COURSE  Pertinent labs & imaging results that were available during my care of the patient were reviewed by me and considered in my medical decision making (see chart for details).  Acute dental pain. Rx given for amoxicillin 500 mg 3 times a day, Motrin 800 mg 3 times a day, Vicodin 5/325. Dental referral given. Patient follow-up with PCP or return to the ER as  needed. ____________________________________________   FINAL CLINICAL IMPRESSION(S) / ED DIAGNOSES  Final diagnoses:  Pain due to dental caries      Evangeline Dakinharles M Ector Laurel, PA-C 10/13/15 1355  Myrna Blazeravid Matthew Schaevitz, MD 10/13/15 1517

## 2015-10-13 NOTE — Discharge Instructions (Signed)

## 2015-11-17 ENCOUNTER — Emergency Department
Admission: EM | Admit: 2015-11-17 | Discharge: 2015-11-17 | Disposition: A | Discharge status: Home / Self Care | Payer: Medicaid Other | Attending: Emergency Medicine | Admitting: Emergency Medicine

## 2015-11-17 ENCOUNTER — Emergency Department: Payer: Medicaid Other

## 2015-11-17 DIAGNOSIS — S199XXA Unspecified injury of neck, initial encounter: Secondary | ICD-10-CM | POA: Insufficient documentation

## 2015-11-17 DIAGNOSIS — M542 Cervicalgia: Secondary | ICD-10-CM

## 2015-11-17 DIAGNOSIS — Y9389 Activity, other specified: Secondary | ICD-10-CM | POA: Diagnosis not present

## 2015-11-17 DIAGNOSIS — Y998 Other external cause status: Secondary | ICD-10-CM | POA: Insufficient documentation

## 2015-11-17 DIAGNOSIS — Z79899 Other long term (current) drug therapy: Secondary | ICD-10-CM | POA: Diagnosis not present

## 2015-11-17 DIAGNOSIS — Z791 Long term (current) use of non-steroidal anti-inflammatories (NSAID): Secondary | ICD-10-CM | POA: Diagnosis not present

## 2015-11-17 DIAGNOSIS — S0990XA Unspecified injury of head, initial encounter: Secondary | ICD-10-CM | POA: Insufficient documentation

## 2015-11-17 DIAGNOSIS — Y92410 Unspecified street and highway as the place of occurrence of the external cause: Secondary | ICD-10-CM | POA: Diagnosis not present

## 2015-11-17 DIAGNOSIS — F17210 Nicotine dependence, cigarettes, uncomplicated: Secondary | ICD-10-CM | POA: Diagnosis not present

## 2015-11-17 DIAGNOSIS — Z792 Long term (current) use of antibiotics: Secondary | ICD-10-CM | POA: Diagnosis not present

## 2015-11-17 DIAGNOSIS — G44319 Acute post-traumatic headache, not intractable: Secondary | ICD-10-CM

## 2015-11-17 MED ORDER — DIAZEPAM 2 MG PO TABS
2.0000 mg | ORAL_TABLET | Freq: Once | ORAL | Status: AC
  Administered 2015-11-17: 2 mg via ORAL
  Filled 2015-11-17: qty 1

## 2015-11-17 MED ORDER — METHOCARBAMOL 500 MG PO TABS: 500.0000 mg | ORAL_TABLET | Freq: Four times a day (QID) | ORAL | Status: DC

## 2015-11-17 NOTE — ED Notes (Signed)
Soft collar removed per PA order.  Advised patient to purchase soft collar for more comfort.

## 2015-11-17 NOTE — ED Notes (Signed)
Patient crying states her neck hurts.  Soft [place] collar size medium placed on neck.  Grip strength equal bilateraaly.

## 2015-11-17 NOTE — Discharge Instructions (Signed)

## 2015-11-17 NOTE — ED Notes (Signed)
Reviewed pt's cc with Dr Zenda Alpers; reviewed Care Everywhere chart; pt received toradol inj/tylenol and had c-spine/thoracic spine xrays completed; pt d/c with cervicalgia; CT ordered per MD for pt's c/o persistent pain to back of head
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
## 2015-11-18 ENCOUNTER — Emergency Department: Payer: Medicaid Other

## 2015-11-18 ENCOUNTER — Encounter: Payer: Self-pay | Admitting: Emergency Medicine

## 2015-11-18 ENCOUNTER — Emergency Department
Admission: EM | Admit: 2015-11-18 | Discharge: 2015-11-18 | Disposition: A | Discharge status: Home / Self Care | Payer: Medicaid Other | Attending: Emergency Medicine | Admitting: Emergency Medicine

## 2015-11-18 DIAGNOSIS — Y9389 Activity, other specified: Secondary | ICD-10-CM | POA: Insufficient documentation

## 2015-11-18 DIAGNOSIS — Z79899 Other long term (current) drug therapy: Secondary | ICD-10-CM | POA: Diagnosis not present

## 2015-11-18 DIAGNOSIS — S3992XA Unspecified injury of lower back, initial encounter: Secondary | ICD-10-CM | POA: Diagnosis not present

## 2015-11-18 DIAGNOSIS — F17210 Nicotine dependence, cigarettes, uncomplicated: Secondary | ICD-10-CM | POA: Diagnosis not present

## 2015-11-18 DIAGNOSIS — M545 Low back pain, unspecified: Secondary | ICD-10-CM

## 2015-11-18 DIAGNOSIS — Y92410 Unspecified street and highway as the place of occurrence of the external cause: Secondary | ICD-10-CM | POA: Diagnosis not present

## 2015-11-18 DIAGNOSIS — Y998 Other external cause status: Secondary | ICD-10-CM | POA: Diagnosis not present

## 2015-11-18 DIAGNOSIS — Z3202 Encounter for pregnancy test, result negative: Secondary | ICD-10-CM | POA: Insufficient documentation

## 2015-11-18 DIAGNOSIS — A084 Viral intestinal infection, unspecified: Secondary | ICD-10-CM | POA: Diagnosis not present

## 2015-11-18 LAB — POCT PREGNANCY, URINE: Preg Test, Ur: NEGATIVE

## 2015-11-18 MED ORDER — HYDROCODONE-ACETAMINOPHEN 5-325 MG PO TABS: 1.0000 | ORAL_TABLET | ORAL | Status: DC | PRN

## 2015-11-18 MED ORDER — KETOROLAC TROMETHAMINE 30 MG/ML IJ SOLN
30.0000 mg | Freq: Once | INTRAMUSCULAR | Status: AC
  Administered 2015-11-18: 30 mg via INTRAVENOUS

## 2015-11-18 MED ORDER — ONDANSETRON HCL 4 MG/2ML IJ SOLN
4.0000 mg | Freq: Once | INTRAMUSCULAR | Status: AC
  Administered 2015-11-18: 4 mg via INTRAVENOUS
  Filled 2015-11-18: qty 2

## 2015-11-18 MED ORDER — DIAZEPAM 2 MG PO TABS: 2.0000 mg | ORAL_TABLET | Freq: Three times a day (TID) | ORAL | Status: DC | PRN

## 2015-11-18 MED ORDER — SODIUM CHLORIDE 0.9 % IV BOLUS (SEPSIS)
1000.0000 mL | Freq: Once | INTRAVENOUS | Status: AC
  Administered 2015-11-18: 1000 mL via INTRAVENOUS

## 2015-11-18 MED ORDER — ONDANSETRON 4 MG PO TBDP: 4.0000 mg | ORAL_TABLET | Freq: Three times a day (TID) | ORAL | Status: DC | PRN

## 2015-11-18 MED ORDER — KETOROLAC TROMETHAMINE 30 MG/ML IJ SOLN
INTRAMUSCULAR | Status: AC
Start: 2015-11-18 — End: 2015-11-18
  Administered 2015-11-18: 30 mg via INTRAVENOUS
  Filled 2015-11-18: qty 1

## 2015-11-18 NOTE — ED Notes (Signed)
States she was seen yesterday  But pain is lower in back today  States she ate some chinese food yesterday afternoon and then developed some abd cramping and diarrhea last pm  No fever
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
## 2015-11-18 NOTE — ED Provider Notes (Signed)
Medical screening examination/treatment/procedure(s) were performed by non-physician practitioner and as supervising physician I was immediately available for consultation/collaboration.    Darci Current, MD 11/18/15 [phone]
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
## 2015-11-18 NOTE — Discharge Instructions (Signed)
Follow with your doctor if no improvement in 2-3 days. Being clear liquids today. Zofran as needed for nausea or vomiting. Take pain medication if needed for severe pain of her back, discontinue taking Robaxin that you were given yesterday. Diazepam 2 mg 1 tablet 3 times a day for muscle spasms for 3 days.

## 2015-11-18 NOTE — ED Notes (Signed)
Up to bathroom with family assistance. Complaining of increased pain PA aware

## 2015-12-31 ENCOUNTER — Encounter: Payer: Self-pay | Admitting: Emergency Medicine

## 2015-12-31 DIAGNOSIS — F17210 Nicotine dependence, cigarettes, uncomplicated: Secondary | ICD-10-CM | POA: Diagnosis not present

## 2015-12-31 DIAGNOSIS — R102 Pelvic and perineal pain: Secondary | ICD-10-CM | POA: Diagnosis not present

## 2015-12-31 DIAGNOSIS — R1032 Left lower quadrant pain: Secondary | ICD-10-CM | POA: Diagnosis present

## 2015-12-31 LAB — URINALYSIS COMPLETE WITH MICROSCOPIC (ARMC ONLY)
BACTERIA UA: NONE SEEN
BILIRUBIN URINE: NEGATIVE
GLUCOSE, UA: NEGATIVE mg/dL
HGB URINE DIPSTICK: NEGATIVE
Ketones, ur: NEGATIVE mg/dL
Leukocytes, UA: NEGATIVE
Nitrite: NEGATIVE
PH: 6 (ref 5.0–8.0)
Protein, ur: NEGATIVE mg/dL
RBC / HPF: NONE SEEN RBC/hpf (ref 0–5)
Specific Gravity, Urine: 1.016 (ref 1.005–1.030)

## 2015-12-31 LAB — COMPREHENSIVE METABOLIC PANEL
ALBUMIN: 4.3 g/dL (ref 3.5–5.0)
ALK PHOS: 65 U/L (ref 38–126)
ALT: 29 U/L (ref 14–54)
ANION GAP: 6 (ref 5–15)
AST: 25 U/L (ref 15–41)
BILIRUBIN TOTAL: 0.5 mg/dL (ref 0.3–1.2)
BUN: 14 mg/dL (ref 6–20)
CO2: 25 mmol/L (ref 22–32)
CREATININE: 0.79 mg/dL (ref 0.44–1.00)
Calcium: 9.3 mg/dL (ref 8.9–10.3)
Chloride: 108 mmol/L (ref 101–111)
GFR calc Af Amer: >60 mL/min (ref >60)
GFR calc non Af Amer: >60 mL/min (ref >60)
GLUCOSE: 84 mg/dL (ref 65–99)
Potassium: 4.1 mmol/L (ref 3.5–5.1)
SODIUM: 139 mmol/L (ref 135–145)
Total Protein: 7.2 g/dL (ref 6.5–8.1)

## 2015-12-31 LAB — CBC
HEMATOCRIT: 44.3 % (ref 35.0–47.0)
Hemoglobin: 14.7 g/dL (ref 12.0–16.0)
MCH: 30.3 pg (ref 26.0–34.0)
MCHC: 33.1 g/dL (ref 32.0–36.0)
MCV: 91.8 fL (ref 80.0–100.0)
PLATELETS: 240 10*3/uL (ref 150–440)
RBC: 4.83 MIL/uL (ref 3.80–5.20)
RDW: 13 % (ref 11.5–14.5)
WBC: 9.6 10*3/uL (ref 3.6–11.0)

## 2015-12-31 LAB — LIPASE, BLOOD: Lipase: 54 U/L — ABNORMAL HIGH (ref 11–51)

## 2015-12-31 LAB — POCT PREGNANCY, URINE: Preg Test, Ur: NEGATIVE

## 2015-12-31 NOTE — ED Notes (Addendum)
Pt to triage via wheelchair c/o lower left abdominal pain that started last night; pt reports pain in the area since c-section 2013. 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  Pt received prescription for "transflex" muscle relaxer on Tuesday, took 2 and received no pain relief but pain got worse and pt took no more.  Last BM Monday (normally as reported).  Pt received some pain relief over last years from moving depo shot from 12 to 9 weeks.   Pt tearful in triage.  Denies dysuria or UTI and kidney stones hx.

## 2015-12-31 NOTE — ED Notes (Signed)
Lab results reviewed. Patient awaiting room for MD eval.

## 2016-01-01 ENCOUNTER — Emergency Department
Admission: EM | Admit: 2016-01-01 | Discharge: 2016-01-01 | Disposition: A | Discharge status: Home / Self Care | Payer: Medicaid Other | Attending: Emergency Medicine | Admitting: Emergency Medicine

## 2016-01-01 ENCOUNTER — Emergency Department: Payer: Medicaid Other

## 2016-01-01 DIAGNOSIS — G8929 Other chronic pain: Secondary | ICD-10-CM

## 2016-01-01 DIAGNOSIS — R102 Pelvic and perineal pain: Secondary | ICD-10-CM

## 2016-01-01 MED ORDER — IOPAMIDOL (ISOVUE-300) INJECTION 61%
100.0000 mL | Freq: Once | INTRAVENOUS | Status: AC | PRN
  Administered 2016-01-01: 100 mL via INTRAVENOUS

## 2016-01-01 MED ORDER — DIATRIZOATE MEGLUMINE & SODIUM 66-10 % PO SOLN
15.0000 mL | Freq: Once | ORAL | Status: AC
  Administered 2016-01-01: 15 mL via ORAL

## 2016-01-01 MED ORDER — ONDANSETRON 4 MG PO TBDP: 4.0000 mg | ORAL_TABLET | Freq: Three times a day (TID) | ORAL | Status: DC | PRN

## 2016-01-01 MED ORDER — MORPHINE SULFATE (PF) 4 MG/ML IV SOLN
4.0000 mg | Freq: Once | INTRAVENOUS | Status: AC
  Administered 2016-01-01: 4 mg via INTRAVENOUS
  Filled 2016-01-01: qty 1

## 2016-01-01 MED ORDER — ONDANSETRON HCL 4 MG/2ML IJ SOLN
4.0000 mg | Freq: Once | INTRAMUSCULAR | Status: AC
  Administered 2016-01-01: 4 mg via INTRAVENOUS
  Filled 2016-01-01: qty 2

## 2016-01-01 MED ORDER — SODIUM CHLORIDE 0.9 % IV BOLUS (SEPSIS)
1000.0000 mL | Freq: Once | INTRAVENOUS | Status: AC
  Administered 2016-01-01: 1000 mL via INTRAVENOUS

## 2016-01-01 NOTE — ED Provider Notes (Signed)
[facility] Emergency Department Provider Note  ____________________________________________  Time seen: Approximately 2:30 AM  I have reviewed the triage vital signs and the nursing notes.   HISTORY  Chief Complaint Abdominal Pain    HPI Mandy Hansen is a 25 y.o. female who presents to the ED from home with a chief complaint of abdominal pain. Patient has a history of chronic pelvic pain, being evaluated at the [facility] pelvic pain clinic, who reports she has had left pelvic pain in the area of her C-section since 2013. Patient had laparoscopy last year with lysis of adhesions and has not had problems since until 3 weeks ago when she started to develop more pelvic pain. She was seen yesterday at [facility], have pelvic exam, and placed on muscle relaxer without relief of symptoms. Complains of nonradiating left pelvic pain now associated with nausea and dry heaves. Denies associated fever, chills, chest pain, shortness of breath, dysuria, diarrhea. Last bowel movement 2 days ago which is normal for patient. Denies recent travel or trauma. Nothing makes her symptoms better or worse.   Past Medical History  Diagnosis Date  . Dysplasia of cervix 2013    dyplasia has resolved since birth of son in 2013.  [name] Psoriasis of scalp 2004    mainly scalp, uses sun for treatment    Patient Active Problem List   Diagnosis Date Noted  . Epigastric pain 06/30/2015    Past Surgical History  Procedure Laterality Date  . Cesarean section    . Cesarean section N/A 2013  . Laparoscopy N/A 05/23/2015    Procedure: LAPAROSCOPY DIAGNOSTIC;  Surgeon: Nadara Mustard, MD;  Location: [facility] ORS;  Service: Gynecology;  Laterality: N/A;  . Cystoscopy N/A 05/23/2015    Procedure: CYSTOSCOPY;  Surgeon: Nadara Mustard, MD;  Location: [facility] ORS;  Service: Gynecology;  Laterality: N/A;  . Esophagogastroduodenoscopy N/A 07/02/2015    Procedure: ESOPHAGOGASTRODUODENOSCOPY (EGD) looking in the esophagus  stomach and upper small intestine with a lighted tube to evaluate and treat;  Surgeon: Christena Deem, MD;  Location: [facility] ENDOSCOPY;  Service: Endoscopy;  Laterality: N/A;  . Abdominal surgery      Current Outpatient Rx  Name  Route  Sig  Dispense  Refill  . acetaminophen (TYLENOL) 325 MG tablet   Oral   Take 650 mg by mouth every 4 (four) hours as needed for mild pain, moderate pain, fever or headache.          . Calcium Carb-Cholecalciferol (CALCIUM 600-D PO)   Oral   Take 1 tablet by mouth daily at 12 noon.         . diazepam (VALIUM) 2 MG tablet   Oral   Take 1 tablet (2 mg total) by mouth every 8 (eight) hours as needed for muscle spasms.   9 tablet   0   . ferrous sulfate 325 (65 FE) MG tablet   Oral   Take 325 mg by mouth daily.         [name] HYDROcodone-acetaminophen (NORCO/VICODIN) 5-325 MG tablet   Oral   Take 1 tablet by mouth every 4 (four) hours as needed for moderate pain.   15 tablet   0   . ibuprofen (ADVIL,MOTRIN) 800 MG tablet   Oral   Take 1 tablet (800 mg total) by mouth every 8 (eight) hours as needed.   30 tablet   0   . medroxyPROGESTERone (DEPO-PROVERA) 150 MG/ML injection   Intramuscular   Inject 1 mL  into the muscle every 3 (three) months.         . Melatonin 5 MG TABS   Oral   Take 5 mg by mouth at bedtime as needed (for sleep).         . ondansetron (ZOFRAN ODT) 4 MG disintegrating tablet   Oral   Take 1 tablet (4 mg total) by mouth every 8 (eight) hours as needed for nausea or vomiting.   20 tablet   0   . pantoprazole (PROTONIX) 40 MG tablet   Oral   Take 1 tablet (40 mg total) by mouth daily.   60 tablet   0     Allergies Review of patient's allergies indicates no known allergies.  History reviewed. No pertinent family history.  Social History Social History  Substance Use Topics  . Smoking status: Current Every Day Smoker -- 0.50 packs/day    Types: Cigarettes  . Smokeless tobacco: None  . Alcohol Use: No     Review of Systems  Constitutional: No fever/chills. Eyes: No visual changes. ENT: No sore throat. Cardiovascular: Denies chest pain. Respiratory: Denies shortness of breath. Gastrointestinal: Positive for pelvic and abdominal pain.  Positive for nausea, no vomiting.  No diarrhea.  No constipation. Genitourinary: Negative for dysuria. Musculoskeletal: Negative for back pain. Skin: Negative for rash. Neurological: Negative for headaches, focal weakness or numbness.  10-point ROS otherwise negative.  ____________________________________________   PHYSICAL EXAM:  VITAL SIGNS: ED Triage Vitals  Enc Vitals Group     BP 12/31/15 2126 127/83 mmHg     Pulse Rate 12/31/15 2126 103     Resp 12/31/15 2126 20     Temp 12/31/15 2126 98.3 F (36.8 C)     Temp Source 12/31/15 2126 Oral     SpO2 12/31/15 2126 100 %     Weight 12/31/15 2126 131 lb (59.421 kg)     Height 12/31/15 2126 5\' 3"  (1.6 m)     Head Cir --      Peak Flow --      Pain Score 12/31/15 2153 10     Pain Loc --      Pain Edu? --      Excl. in GC? --     Constitutional: Alert and oriented. Well appearing and in mild acute distress. Spitting into an emesis bag; no emesis noted. Eyes: Conjunctivae are normal. PERRL. EOMI. Head: Atraumatic. Nose: No congestion/rhinnorhea. Mouth/Throat: Mucous membranes are moist.  Oropharynx non-erythematous. Neck: No stridor.   Cardiovascular: Normal rate, regular rhythm. Grossly normal heart sounds.  Good peripheral circulation. Respiratory: Normal respiratory effort.  No retractions. Lungs CTAB. Gastrointestinal: Soft and mildly diffusely tender to palpation without rebound or guarding. No distention. No abdominal bruits. No CVA tenderness. Genitourinary: Pelvic exam deferred as patient had one yesterday. Musculoskeletal: No lower extremity tenderness nor edema.  No joint effusions. Neurologic:  Normal speech and language. No gross focal neurologic deficits are appreciated. No  gait instability. Skin:  Skin is warm, dry and intact. No rash noted. Psychiatric: Mood and affect are normal. Speech and behavior are normal.  ____________________________________________   LABS (all labs ordered are listed, but only abnormal results are displayed)  Labs Reviewed  LIPASE, BLOOD - Abnormal; Notable for the following:    Lipase 54 (*)    All other components within normal limits  URINALYSIS COMPLETEWITH MICROSCOPIC ([facility] ONLY) - Abnormal; Notable for the following:    Color, Urine YELLOW (*)    APPearance CLEAR (*)  Squamous Epithelial / LPF 0-5 (*)    All other components within normal limits  COMPREHENSIVE METABOLIC PANEL  CBC  POCT PREGNANCY, URINE  POC URINE PREG, ED   ____________________________________________  EKG  None ____________________________________________  RADIOLOGY  CT abdomen pelvis with contrast interpreted per Dr. Clovis Riley: No significant abnormality ____________________________________________   PROCEDURES  Procedure(s) performed: None  Critical Care performed: No  ____________________________________________   INITIAL IMPRESSION / ASSESSMENT AND PLAN / ED COURSE  Pertinent labs & imaging results that were available during my care of the patient were reviewed by me and considered in my medical decision making (see chart for details).  25 year old female with chronic pelvic pain who presents with acute diffuse pain associated with nausea only. Initial laboratory urinalysis results are unremarkable. Will administer IV analgesia and obtain CT abdomen/pelvis to evaluate for intra-abdominal pathology.  ----------------------------------------- 4:32 AM on 01/01/2016 -----------------------------------------  Patient seen to be joking with her boyfriend in room. When questioned, patient still reports severe pain. No emesis and patient has been able to tolerate oral contrast without vomiting. I reviewed patient's chart in [place]  narcotics database which reveals she has filled prescriptions totaling 100 Percocet since 12/17/2015. I also reviewed patient's chart from her recent visit at the chronic pelvic pain clinic which states they want the patient to wean from narcotics and instead use muscle relaxers only. I have explained this to patient and boyfriend as to why I'm not prescribing opioid pain medicines. Strict return precautions given both verbalize understanding and agree with plan of care. ____________________________________________   FINAL CLINICAL IMPRESSION(S) / ED DIAGNOSES  Final diagnoses:  Chronic pelvic pain in female      Irean Hong, MD 01/01/16 [id number]

## 2016-01-01 NOTE — ED Notes (Signed)
CT contrast left with pt. Pt is drinking st this time. Call bell in reach and pt verbalized understanding to call RN when finished.

## 2016-01-01 NOTE — Discharge Instructions (Signed)
1. Continue muscle relaxer as prescribed by your specialist. 2. Use nausea medicine as needed (Zofran #20). 3. Return to the ER for worsening symptoms, persistent vomiting, difficulty breathing or other concerns.  Chronic Pain Chronic pain can be defined as pain that is off and on and lasts for 3-6 months or longer. Many things cause chronic pain, which can make it difficult to make a diagnosis. There are many treatment options available for chronic pain. However, finding a treatment that works well for you may require trying various approaches until the right one is found. Many people benefit from a combination of two or more types of treatment to control their pain. SYMPTOMS  Chronic pain can occur anywhere in the body and can range from mild to very severe. Some types of chronic pain include:  Headache.  Low back pain.  Cancer pain.  Arthritis pain.  Neurogenic pain. This is pain resulting from damage to nerves. People with chronic pain may also have other symptoms such as:  Depression.  Anger.  Insomnia.  Anxiety. DIAGNOSIS  Your health care provider will help diagnose your condition over time. In many cases, the initial focus will be on excluding possible conditions that could be causing the pain. Depending on your symptoms, your health care provider may order tests to diagnose your condition. Some of these tests may include:   Blood tests.   CT scan.   MRI.   X-rays.   Ultrasounds.   Nerve conduction studies.  You may need to see a specialist.  TREATMENT  Finding treatment that works well may take time. You may be referred to a pain specialist. He or she may prescribe medicine or therapies, such as:   Mindful meditation or yoga.  Shots (injections) of numbing or pain-relieving medicines into the spine or area of pain.  Local electrical stimulation.  Acupuncture.   Massage therapy.   Aroma, color, light, or sound therapy.   Biofeedback.    Working with a physical therapist to keep from getting stiff.   Regular, gentle exercise.   Cognitive or behavioral therapy.   Group support.  Sometimes, surgery may be recommended.  HOME CARE INSTRUCTIONS   Take all medicines as directed by your health care provider.   Lessen stress in your life by relaxing and doing things such as listening to calming music.   Exercise or be active as directed by your health care provider.   Eat a healthy diet and include things such as vegetables, fruits, fish, and lean meats in your diet.   Keep all follow-up appointments with your health care provider.   Attend a support group with others suffering from chronic pain. SEEK MEDICAL CARE IF:   Your pain gets worse.   You develop a new pain that was not there before.   You cannot tolerate medicines given to you by your health care provider.   You have new symptoms since your last visit with your health care provider.  SEEK IMMEDIATE MEDICAL CARE IF:   You feel weak.   You have decreased sensation or numbness.   You lose control of bowel or bladder function.   Your pain suddenly gets much worse.   You develop shaking.  You develop chills.  You develop confusion.  You develop chest pain.  You develop shortness of breath.  MAKE SURE YOU:  Understand these instructions.  Will watch your condition.  Will get help right away if you are not doing well or get worse.   This  information is not intended to replace advice given to you by your health care provider. Make sure you discuss any questions you have with your health care provider.   Document Released: 06/05/2002 Document Revised: 05/16/2013 Document Reviewed: 03/09/2013 Elsevier Interactive Patient Education [employer].

## 2016-01-01 NOTE — ED Notes (Signed)
Pt reports she has started a new medication. PT was unable to provide name. Pt reports pain began when medication began.

## 2016-01-01 NOTE — ED Notes (Signed)
Patient transported to CT 

## 2016-02-04 ENCOUNTER — Ambulatory Visit: Payer: Medicaid Other | Attending: Student | Admitting: Physical Therapy

## 2016-02-04 DIAGNOSIS — M6281 Muscle weakness (generalized): Secondary | ICD-10-CM | POA: Insufficient documentation

## 2016-02-04 DIAGNOSIS — R279 Unspecified lack of coordination: Secondary | ICD-10-CM | POA: Insufficient documentation

## 2016-02-04 NOTE — Patient Instructions (Addendum)
You are now ready to begin training the deep core muscles system: diaphragm, transverse abdominis, pelvic floor . These muscles must work together as a team.           The key to these exercises to train the brain to coordinate the timing of these muscles and to have them turn on for long periods of time to hold you upright against gravity (especially important if you are on your feet all day).These muscles are postural muscles and play a role stabilizing your spine and bodyweight. By doing these repetitions slowly and correctly instead of doing crunches, you will achieve a flatter belly without a lower pooch. You are also placing your spine in a more neutral position and breathing properly which in turn, decreases your risk for problems related to your pelvic floor, abdominal, and low back such as pelvic organ prolapse, hernias, diastasis recti (separation of superficial muscles), disk herniations, spinal fractures. These exercises set a solid foundation for you to later progress to resistance/ strength training with therabands and weights and return to other typical fitness exercises with a stronger deeper core.  Do level 1-2 (30 reps x 2 day) for 2 weeks.  Progress to Level 3 once you notice no pelvic rocking when lifting the foot.  Progress to Level 4 two weeks after having completed level 3    ___________  3 types of massages: Light and relaxed knuckles and shoulders, arms  Keep breathing   Scar: zig zag  10 x 2 back and forth  Sides to belly button ( Center, L, R - 3 each)  5-7 times  Circular massage for digestion  Lower R to upper R to L upper  To R bottom   _____________________   Standing posture,  Sitting posture   Body mechanics with household chores (handout)    ______________  Also perform exercises in [email or web address] by Hillis RangeMarianne Ryan   Further PT for free:  [facility]  [address] [place], [place] [address] [phone] [email or web address]

## 2016-02-05 NOTE — Therapy (Signed)
[facility] MAIN [facility] SERVICES [address] [place], [place], [address] Phone: [phone]   Fax:  [phone]  Physical Therapy Evaluation  Patient Details  Name: Mandy Hansen MRN: [id number] Date of Birth: 04-18-1991 Referring Provider: Linna Hoff  Encounter Date: 02/04/2016      PT End of Session - 02/05/16 2252    Visit Number 1   Number of Visits 1   Date for PT Re-Evaluation 02/05/16   Authorization Type medicaid coverage is limited to one visit and pt is u nable to pay out of pocket for more visits   PT Start Time 1540   PT Stop Time 1645   PT Time Calculation (min) 65 min   Activity Tolerance Patient tolerated treatment well;No increased pain   Behavior During Therapy [facility] for tasks assessed/performed      Past Medical History  Diagnosis Date  . Dysplasia of cervix 2013    dyplasia has resolved since birth of son in 2013.  [name] Psoriasis of scalp 2004    mainly scalp, uses sun for treatment    Past Surgical History  Procedure Laterality Date  . Cesarean section    . Cesarean section N/A 2013  . Laparoscopy N/A 05/23/2015    Procedure: LAPAROSCOPY DIAGNOSTIC;  Surgeon: Nadara Mustard, MD;  Location: [facility] ORS;  Service: Gynecology;  Laterality: N/A;  . Cystoscopy N/A 05/23/2015    Procedure: CYSTOSCOPY;  Surgeon: Nadara Mustard, MD;  Location: [facility] ORS;  Service: Gynecology;  Laterality: N/A;  . Esophagogastroduodenoscopy N/A 07/02/2015    Procedure: ESOPHAGOGASTRODUODENOSCOPY (EGD) looking in the esophagus stomach and upper small intestine with a lighted tube to evaluate and treat;  Surgeon: Christena Deem, MD;  Location: [facility] ENDOSCOPY;  Service: Endoscopy;  Laterality: N/A;  . Abdominal surgery      There were no vitals filed for this visit.       Subjective Assessment - 02/04/16 1547    Subjective Pt 's pelvic pain started 3 months after having her first child in 2013. The pain was described as "contraction" or labor  pain which would last for a few days and reoccur a few months at  time. The second year, the pain did not dissipate. Pt underwent a laproscopic surgery in 04/2015 with Dr. Tiburcio Pea at [facility]. The pain resolved after the procedure (which removed scar adhesions by large intestines by uterus)  for 6 months. After that time, the pain returned at a lesser intensity. Currently her pain level is a 6/10 constant in lower abdomen. occasionally radiates from the L suprapubic to L back.  Denied difficulty with urination, defecation. Pain impacts her ability to perform daily chores,  pick up child, leaning up from laying down, and to stand for long periods. Pt has not be able to work and she finds the pain to interfere with keepig foods down 50% of the time. Pt has loss weight (30 pounds) since the pain onset and she has to eat smaller portions.      Patient Stated Goals to get back to normal routine, playing with son and keep son's schedule             [facility] PT Assessment - 02/05/16 2232    Assessment   Medical Diagnosis high pelvic floor tone   Referring Provider [place]   Precautions   Precautions None   Restrictions   Weight Bearing Restrictions No   Balance Screen   Has the patient fallen in the past 6 months  No   Observation/Other Assessments   Observations slouching sitting, sway back in stance   Coordination   Gross Motor Movements are Fluid and Coordinated --  limited diaphragmatic excursion   Fine Motor Movements are Fluid and Coordinated --  abdominal straining with cue for pelvic floor contraction   Posture/Postural Control   Posture Comments lumbopelvic instability w/ hip flexion    Palpation   Palpation comment tenderness along suprapubic area near scars, limited mobility, L > R                  Pelvic Floor Special Questions - 02/05/16 2232    Diastasis Recti 2 fingers below sternum          [facility] Adult PT Treatment/Exercise - 02/05/16 2232    Therapeutic Activites     Therapeutic Activities --  body mechanics with lifting child, household chores   Neuro Re-ed    Neuro Re-ed Details  postural training, breathing training   Manual Therapy   Manual therapy comments quadriped position: fascial release over TrA, scar massage                PT Education - 02/04/16 1631    Education provided Yes   Education Details HEP, goals    Person(s) Educated Patient   Methods Explanation;Demonstration;Tactile cues;Verbal cues;Handout   Comprehension Returned demonstration;Verbalized understanding          PT Short Term Goals - 02/04/16 1615    PT SHORT TERM GOAL #1   Title pt will demo IND with HEP in order to self manage pain   Time 1   Period Days   Status Achieved   PT SHORT TERM GOAL #2   Title pt will demo proper body mechanics with sweeping, vacuuming, and lifting while co-activating the deep core mm in order to perform ADLs    Time 1   Period Days   Status Achieved   PT SHORT TERM GOAL #3   Title Pt will demo no lumbopelvic instability with Deep Core Level 2 for > 5 reps in order to have enough IAP for loaded activities.    Time 1   Period Days   Status Achieved   PT SHORT TERM GOAL #4   Title Pt will demo decreased abdominal separation above the umbilicus from 3 fingers width to < 2 fingers in order to decrease pain.   Time 1   Status Achieved                  Plan - 02/05/16 2253    Clinical Impression Statement Pt is a 25 yo who complaints of pelvic pain with occasional radiation from L suprapubic area to her back. This deficit impacts her ability to lift her son and perform household chores. Pt 's clincial presentations include diastasis recti, poor posture, increased abdominal scar restrictions, poor body mechanics and deep core coordination and strength.At the end of today's session, pt showed decreased abdominal separation and performed simulated tasks with lifting and household chores with proper body mechanics. Due to  pt's limited financial funds for more visits, pt was provided resources to continue managing her pain with deep core exercises and to utilize probono [place] clinic [place]. Pt achieved her STG and is being D/C at this time.      Rehab Potential Good   Clinical Impairments Affecting Rehab Potential Limited insurance coverage and pt with financial restraints   PT Frequency One time visit   Consulted and Agree with Plan  of Care Patient      Patient will benefit from skilled therapeutic intervention in order to improve the following deficits and impairments:  Pain, Improper body mechanics, Increased fascial restricitons, Decreased range of motion, Hypomobility, Decreased endurance, Decreased coordination, Decreased scar mobility, Increased muscle spasms, Postural dysfunction  Visit Diagnosis: Muscle weakness (generalized)  Unspecified lack of coordination     Problem List Patient Active Problem List   Diagnosis Date Noted  . Epigastric pain 06/30/2015    Mariane Masters ,PT, DPT, E-RYT  02/05/2016, 10:58 PM   [facility] MAIN [facility] SERVICES [address] [place], [place], [address] Phone: [phone]   Fax:  [phone]  Name: Mandy Hansen MRN: [id number] Date of Birth: 03-May-1991

## 2016-02-16 ENCOUNTER — Emergency Department
Admission: EM | Admit: 2016-02-16 | Discharge: 2016-02-17 | Disposition: A | Discharge status: Home / Self Care | Payer: Medicaid Other | Attending: Emergency Medicine | Admitting: Emergency Medicine

## 2016-02-16 ENCOUNTER — Encounter: Payer: Self-pay | Admitting: Emergency Medicine

## 2016-02-16 DIAGNOSIS — Z87891 Personal history of nicotine dependence: Secondary | ICD-10-CM | POA: Diagnosis not present

## 2016-02-16 DIAGNOSIS — Z79899 Other long term (current) drug therapy: Secondary | ICD-10-CM | POA: Insufficient documentation

## 2016-02-16 DIAGNOSIS — H5712 Ocular pain, left eye: Secondary | ICD-10-CM | POA: Diagnosis present

## 2016-02-16 DIAGNOSIS — T2692XA Corrosion of left eye and adnexa, part unspecified, initial encounter: Secondary | ICD-10-CM

## 2016-02-16 DIAGNOSIS — F1290 Cannabis use, unspecified, uncomplicated: Secondary | ICD-10-CM | POA: Insufficient documentation

## 2016-02-16 DIAGNOSIS — T6591XA Toxic effect of unspecified substance, accidental (unintentional), initial encounter: Secondary | ICD-10-CM | POA: Diagnosis not present

## 2016-02-16 MED ORDER — ERYTHROMYCIN 5 MG/GM OP OINT: 1.0000 "application " | TOPICAL_OINTMENT | Freq: Four times a day (QID) | OPHTHALMIC | Status: DC

## 2016-02-16 MED ORDER — TETRACAINE HCL 0.5 % OP SOLN
OPHTHALMIC | Status: AC
  Administered 2016-02-16: 2 [drp] via OPHTHALMIC
  Filled 2016-02-16: qty 2

## 2016-02-16 MED ORDER — FLUORESCEIN SODIUM 1 MG OP STRP
ORAL_STRIP | OPHTHALMIC | Status: AC
Start: 2016-02-16 — End: 2016-02-16
  Administered 2016-02-16: 1 via OPHTHALMIC
  Filled 2016-02-16: qty 1

## 2016-02-16 MED ORDER — TETRACAINE HCL 0.5 % OP SOLN
2.0000 [drp] | Freq: Once | OPHTHALMIC | Status: AC
  Administered 2016-02-16: 2 [drp] via OPHTHALMIC
  Filled 2016-02-16: qty 2

## 2016-02-16 MED ORDER — OXYCODONE-ACETAMINOPHEN 5-325 MG PO TABS: 1.0000 | ORAL_TABLET | Freq: Four times a day (QID) | ORAL | Status: DC | PRN

## 2016-02-16 MED ORDER — OXYCODONE-ACETAMINOPHEN 5-325 MG PO TABS
1.0000 | ORAL_TABLET | Freq: Once | ORAL | Status: AC
  Administered 2016-02-16: 1 via ORAL
  Filled 2016-02-16: qty 1

## 2016-02-16 MED ORDER — ERYTHROMYCIN 5 MG/GM OP OINT
TOPICAL_OINTMENT | Freq: Once | OPHTHALMIC | Status: AC
  Administered 2016-02-16: 1 via OPHTHALMIC
  Filled 2016-02-16: qty 1

## 2016-02-16 MED ORDER — FLUORESCEIN SODIUM 1 MG OP STRP
1.0000 | ORAL_STRIP | Freq: Once | OPHTHALMIC | Status: AC
  Administered 2016-02-16: 1 via OPHTHALMIC
  Filled 2016-02-16: qty 1

## 2016-02-16 NOTE — ED Notes (Signed)
Poison control called advised to run another liter of NS if pt. Still complains of burning to lt. Eye. Poison control will return call for follow up.

## 2016-02-16 NOTE — ED Notes (Signed)
Applied morgan lens to pt. Lt. Eye.  Pt. Stated eye was still burning.  After talking to poision control it was suggested to run 1 liter bag of NS.  1 liter bag running at this time.

## 2016-02-16 NOTE — ED Notes (Signed)
Pt. States while cleaning grill, she accidentally got grill cleaner splashed in lt. Eye.

## 2016-02-16 NOTE — ED Notes (Signed)
Morgan lens applied to lt. Eye on 250 ml bag of NS.

## 2016-02-16 NOTE — ED Notes (Signed)
1 liter bag finished with morgan lens on lt. Eye.

## 2016-02-16 NOTE — Discharge Instructions (Signed)
Please see [facility] tomorrow to follow up  How to Use Eye Drops and Eye Ointments  HOW TO APPLY EYE DROPS  Follow these steps when applying eye drops:  1. Wash your hands. 2. Tilt your head back. 3. Put a finger under your eye and use it to gently pull your lower lid downward. Keep that finger in place. 4. Using your other hand, hold the dropper between your thumb and index finger. 5. Position the dropper just over the edge of the lower lid. Hold it as close to your eye as you can without touching the dropper to your eye. 6. Steady your hand. One way to do this is to lean your index finger against your brow. 7. Look up. 8. Slowly and gently squeeze one drop of medicine into your eye. 9. Close your eye. 10. Place a finger between your lower eyelid and your nose. Press gently for 2 minutes. This increases the amount of time that the medicine is exposed to the eye. It also reduces side effects that can develop if the drop gets into the bloodstream through the nose. HOW TO APPLY EYE OINTMENTS  Follow these steps when applying eye ointments:  1. Wash your hands. 2. Put a finger under your eye and use it to gently pull your lower lid downward. Keep that finger in place. 3. Using your other hand, place the tip of the tube between your thumb and index finger with the remaining fingers braced against your cheek or nose. 4. Hold the tube just over the edge of your lower lid without touching the tube to your lid or eyeball. 5. Look up. 6. Line the inner part of your lower lid with ointment. 7. Gently pull up on your upper lid and look down. This will force the ointment to spread over the surface of the eye. 8. Release the upper lid. 9. If you can, close your eyes for 1-2 minutes. Do not rub your eyes. If you applied the ointment correctly, your vision will be blurry for a few minutes. This is normal.  ADDITIONAL INFORMATION  Make sure to use the eye drops or ointment as told by your health  care provider.  If you have been told to use both eye drops and an eye ointment, apply the eye drops first, then wait 3-4 minutes before you apply the ointment.  Try not to touch the tip of the dropper or tube to your eye. A dropper or tube that has touched the eye can become contaminated. This information is not intended to replace advice given to you by your health care provider. Make sure you discuss any questions you have with your health care provider.  Document Released: 12/20/2000 Document Revised: 01/28/2015 Document Reviewed: 09/09/2014  Elsevier Interactive Patient Education [employer]2016 Elsevier Inc.

## 2016-02-16 NOTE — ED Notes (Signed)
Morgan lens removed after 250 ml bag of ns.

## 2016-02-16 NOTE — ED Provider Notes (Signed)
[facility] Emergency Department Provider Note  ____________________________________________  Time seen: Approximately 9:07 PM  I have reviewed the triage vital signs and the nursing notes.   HISTORY  Chief Complaint Eye Pain    HPI Mandy Hansen is a 24 y.o. female , NAD, presents to the emergency department after having group cleaning solution splashed in her left eye. 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 Has had STEMI and burning since the incident. It is difficult to keep her eyes open due to pain. Denies any other injury or trauma to the face or eye. Has had no loss of vision. Does feel vision is blurry when she tries to open the eye.    Past Medical History  Diagnosis Date  . Dysplasia of cervix 2013    dyplasia has resolved since birth of son in 2013.  [name] Psoriasis of scalp 2004    mainly scalp, uses sun for treatment    Patient Active Problem List   Diagnosis Date Noted  . Epigastric pain 06/30/2015    Past Surgical History  Procedure Laterality Date  . Cesarean section    . Cesarean section N/A 2013  . Laparoscopy N/A 05/23/2015    Procedure: LAPAROSCOPY DIAGNOSTIC;  Surgeon: Nadara Mustard, MD;  Location: [facility] ORS;  Service: Gynecology;  Laterality: N/A;  . Cystoscopy N/A 05/23/2015    Procedure: CYSTOSCOPY;  Surgeon: Nadara Mustard, MD;  Location: [facility] ORS;  Service: Gynecology;  Laterality: N/A;  . Esophagogastroduodenoscopy N/A 07/02/2015    Procedure: ESOPHAGOGASTRODUODENOSCOPY (EGD) looking in the esophagus stomach and upper small intestine with a lighted tube to evaluate and treat;  Surgeon: Christena Deem, MD;  Location: [facility] ENDOSCOPY;  Service: Endoscopy;  Laterality: N/A;  . Abdominal surgery      Current Outpatient Rx  Name  Route  Sig  Dispense  Refill  . acetaminophen (TYLENOL) 325 MG tablet   Oral   Take 650 mg by mouth every 4 (four)  hours as needed for mild pain, moderate pain, fever or headache.          . Calcium Carb-Cholecalciferol (CALCIUM 600-D PO)   Oral   Take 1 tablet by mouth daily at 12 noon.         . diazepam (VALIUM) 2 MG tablet   Oral   Take 1 tablet (2 mg total) by mouth every 8 (eight) hours as needed for muscle spasms.   9 tablet   0   . DULoxetine (CYMBALTA) 20 MG capsule   Oral   Take 20 mg by mouth daily.         [name] erythromycin ophthalmic ointment   Left Eye   Place 1 application into the left eye 4 (four) times daily. Apply 1cm ribbon to lower eyelid 4 times daily.   3.5 g   0   . ferrous sulfate 325 (65 FE) MG tablet   Oral   Take 325 mg by mouth daily.         [name] ibuprofen (ADVIL,MOTRIN) 800 MG tablet   Oral   Take 1 tablet (800 mg total) by mouth every 8 (eight) hours as needed.   30 tablet   0   . medroxyPROGESTERone (DEPO-PROVERA) 150 MG/ML injection   Intramuscular   Inject 1 mL into the muscle every 3 (three) months.         . Melatonin 5 MG TABS  Oral   Take 5 mg by mouth at bedtime as needed (for sleep).         [name]. oxyCODONE-acetaminophen (ROXICET) 5-325 MG tablet   Oral   Take 1 tablet by mouth every 6 (six) hours as needed.   8 tablet   0   . pantoprazole (PROTONIX) 40 MG tablet   Oral   Take 1 tablet (40 mg total) by mouth daily.   60 tablet   0     Allergies Review of patient's allergies indicates no known allergies.  History reviewed. No pertinent family history.  Social History Social History  Substance Use Topics  . Smoking status: Former Smoker -- 0.50 packs/day    Types: Cigarettes  . Smokeless tobacco: None  . Alcohol Use: No     Review of Systems  Constitutional: No fever/chills Eyes: Positive pain about left eye. Positive blurry vision left eye. No discharge, redness, swelling ENT: No sore throat. Cardiovascular: No chest pain. Respiratory:  No shortness of breath. No wheezing.  Gastrointestinal: No abdominal pain.   No nausea, vomiting.  Musculoskeletal: Negative for neck or facial pain.  Skin: Negative for rash, redness, swelling, open wounds, skin sores. Neurological: Negative for headaches, focal weakness or numbness. No tingling. 10-point ROS otherwise negative.  ____________________________________________   PHYSICAL EXAM:  VITAL SIGNS: ED Triage Vitals  Enc Vitals Group     BP 02/16/16 2004 159/92 mmHg     Pulse Rate 02/16/16 2004 100     Resp 02/16/16 2004 20     Temp 02/16/16 2004 98.5 F (36.9 C)     Temp Source 02/16/16 2004 Oral     SpO2 02/16/16 2004 98 %     Weight 02/16/16 2004 135 lb (61.236 kg)     Height 02/16/16 2004 5\' 3"  (1.6 m)     Head Cir --      Peak Flow --      Pain Score --      Pain Loc --      Pain Edu? --      Excl. in GC? --      Constitutional: Alert and oriented. Well appearing and in no acute distress, but in pain. Eyes: No fluorescein uptake noted in the left eye. Bilateral conjunctivae with trace injection. PERRLA. EOMI without pain.  Head: Atraumatic. ENT:            Nose: No congestion/rhinnorhea.      Mouth/Throat: Mucous membranes are moist.  Neck: Supple with full range of motion. Hematological/Lymphatic/Immunilogical: No cervical lymphadenopathy. Respiratory: Normal respiratory effort without tachypnea or retractions.  Neurologic:  Normal speech and language. Gait and posture is normal. Skin:  Skin is warm, dry and intact. No rash noted. Psychiatric: Mood and affect are normal. Speech and behavior are normal. Patient exhibits appropriate insight and judgement.   ____________________________________________   LABS  None ____________________________________________  EKG  None ____________________________________________  RADIOLOGY  None ____________________________________________    PROCEDURES  Procedure(s) performed: Left eye was copiously irrigated with 250 mL of normal saline through a Morgan's lens. 30 minutes after  irrigation was complete, left eye pH was noted to be 7.0.   Visual acuity in the right is 20/50. Visual acuity in the left eye is 20/70. Patient notes that she cannot see at a far distance without glasses. She does not have her glasses with her. She states that her current visual acuity is stable for vision without correction.   Medications  erythromycin ophthalmic ointment (not administered)  oxyCODONE-acetaminophen (PERCOCET/ROXICET) 5-325 MG per tablet 1 tablet (not administered)  tetracaine (PONTOCAINE) 0.5 % ophthalmic solution 2 drop (2 drops Left Eye Given 02/16/16 2137)  fluorescein ophthalmic strip 1 strip (1 strip Left Eye Given 02/16/16 2137)    ____________________________________________   INITIAL IMPRESSION / ASSESSMENT AND PLAN / ED COURSE  Patient's diagnosis is consistent with chemical insult to the left eye. Poison control was contacted in regards to the injury and recommendations were followed. I called the ophthalmology on-call in reach the answering service. They stated their physicians were not available at this time. I left a message with the service with the patient's name, date of birth and contact phone number and instructed that she would be at [facility] when they opened at 8 AM tomorrow morning. Patient will be discharged home with prescriptions for Percocet to use as needed for pain as well as erythromycin ophthalmic ointment.  Patient is given strict precautions to return to the ED for any worsening or new symptoms.    ____________________________________________  FINAL CLINICAL IMPRESSION(S) / ED DIAGNOSES  Final diagnoses:  Chemical insult, eye, left, initial encounter      NEW MEDICATIONS STARTED DURING THIS VISIT:  New Prescriptions   ERYTHROMYCIN OPHTHALMIC OINTMENT    Place 1 application into the left eye 4 (four) times daily. Apply 1cm ribbon to lower eyelid 4 times daily.   OXYCODONE-ACETAMINOPHEN (ROXICET) 5-325 MG TABLET    Take 1  tablet by mouth every 6 (six) hours as needed.         Hope Pigeon, PA-C 02/16/16 2339  Phineas Semen, MD 02/16/16 [phone]

## 2016-02-17 NOTE — ED Notes (Signed)
Pt. Going home with work excuse.  Pt. Will go to eye appointment in morning set 8am.

## 2016-03-23 ENCOUNTER — Emergency Department
Admission: EM | Admit: 2016-03-23 | Discharge: 2016-03-23 | Disposition: A | Discharge status: Home / Self Care | Payer: Medicaid Other | Attending: Emergency Medicine | Admitting: Emergency Medicine

## 2016-03-23 ENCOUNTER — Encounter: Payer: Self-pay | Admitting: Emergency Medicine

## 2016-03-23 DIAGNOSIS — M545 Low back pain: Secondary | ICD-10-CM | POA: Diagnosis present

## 2016-03-23 DIAGNOSIS — F1290 Cannabis use, unspecified, uncomplicated: Secondary | ICD-10-CM | POA: Insufficient documentation

## 2016-03-23 DIAGNOSIS — Z79899 Other long term (current) drug therapy: Secondary | ICD-10-CM | POA: Diagnosis not present

## 2016-03-23 DIAGNOSIS — G8929 Other chronic pain: Secondary | ICD-10-CM | POA: Diagnosis not present

## 2016-03-23 DIAGNOSIS — Z87891 Personal history of nicotine dependence: Secondary | ICD-10-CM | POA: Diagnosis not present

## 2016-03-23 DIAGNOSIS — M5441 Lumbago with sciatica, right side: Secondary | ICD-10-CM | POA: Diagnosis not present

## 2016-03-23 LAB — URINALYSIS COMPLETE WITH MICROSCOPIC (ARMC ONLY)
BACTERIA UA: NONE SEEN
Bilirubin Urine: NEGATIVE
Glucose, UA: NEGATIVE mg/dL
HGB URINE DIPSTICK: NEGATIVE
LEUKOCYTES UA: NEGATIVE
Nitrite: NEGATIVE
PH: 5 (ref 5.0–8.0)
Protein, ur: 30 mg/dL — AB
SPECIFIC GRAVITY, URINE: 1.025 (ref 1.005–1.030)

## 2016-03-23 LAB — POCT PREGNANCY, URINE: PREG TEST UR: NEGATIVE

## 2016-03-23 MED ORDER — DIAZEPAM 2 MG PO TABS: 2.0000 mg | ORAL_TABLET | Freq: Three times a day (TID) | ORAL | Status: DC | PRN

## 2016-03-23 MED ORDER — PREDNISONE 10 MG PO TABS: ORAL_TABLET | ORAL | Status: DC

## 2016-03-23 MED ORDER — KETOROLAC TROMETHAMINE 30 MG/ML IJ SOLN
60.0000 mg | Freq: Once | INTRAMUSCULAR | Status: AC
  Administered 2016-03-23: 60 mg via INTRAMUSCULAR
  Filled 2016-03-23: qty 2

## 2016-03-23 MED ORDER — DIAZEPAM 5 MG PO TABS
5.0000 mg | ORAL_TABLET | Freq: Once | ORAL | Status: AC
  Administered 2016-03-23: 5 mg via ORAL
  Filled 2016-03-23: qty 1

## 2016-03-23 NOTE — ED Provider Notes (Signed)
[facility] Emergency Department Provider Note  ____________________________________________  Time seen: Approximately 8:33 AM  I have reviewed the triage vital signs and the nursing notes.   HISTORY  Chief Complaint Back Pain   HPI Mandy Hansen is a 25 y.o. female is here complaining of low back pain which she has been experiencing for approximately 3-4 years however she states that over the last 2-3 days is coming worse. Patient states that her low back pain radiates into her right leg occasionally. She denies any urinary symptoms, kidney stones or recent trauma. Patient states she was seen at [facility] week for her depression that has not been seen for her back pain. Currently she was taking over-the-counter medications such as Tylenol and ibuprofen along with an over-the-counter pain patch. This morning she states that the pain is worse. She denies seeing a specialist in the past for her back. She denies any bowel or bladder incontinence.Currently she rates her pain as a 10 over 10.   Past Medical History  Diagnosis Date  . Dysplasia of cervix 2013    dyplasia has resolved since birth of son in 2013.  [name]. Psoriasis of scalp 2004    mainly scalp, uses sun for treatment    Patient Active Problem List   Diagnosis Date Noted  . Epigastric pain 06/30/2015    Past Surgical History  Procedure Laterality Date  . Cesarean section    . Cesarean section N/A 2013  . Laparoscopy N/A 05/23/2015    Procedure: LAPAROSCOPY DIAGNOSTIC;  Surgeon: Nadara Mustardobert P Harris, MD;  Location: [facility] ORS;  Service: Gynecology;  Laterality: N/A;  . Cystoscopy N/A 05/23/2015    Procedure: CYSTOSCOPY;  Surgeon: Nadara Mustardobert P Harris, MD;  Location: [facility] ORS;  Service: Gynecology;  Laterality: N/A;  . Esophagogastroduodenoscopy N/A 07/02/2015    Procedure: ESOPHAGOGASTRODUODENOSCOPY (EGD) looking in the esophagus stomach and upper small intestine with a lighted tube to evaluate and  treat;  Surgeon: Christena DeemMartin U Skulskie, MD;  [facility];  Service: Endoscopy;  Laterality: N/A;  . Abdominal surgery      Current Outpatient Rx  Name  Route  Sig  Dispense  Refill  . acetaminophen (TYLENOL) 325 MG tablet   Oral   Take 650 mg by mouth every 4 (four) hours as needed for mild pain, moderate pain, fever or headache.          . Calcium Carb-Cholecalciferol (CALCIUM 600-D PO)   Oral   Take 1 tablet by mouth daily at 12 noon.         . diazepam (VALIUM) 2 MG tablet   Oral   Take 1 tablet (2 mg total) by mouth every 8 (eight) hours as needed for muscle spasms.   9 tablet   0   . DULoxetine (CYMBALTA) 20 MG capsule   Oral   Take 20 mg by mouth daily.         . ferrous sulfate 325 (65 FE) MG tablet   Oral   Take 325 mg by mouth daily.         . medroxyPROGESTERone (DEPO-PROVERA) 150 MG/ML injection   Intramuscular   Inject 1 mL into the muscle every 3 (three) months.         . Melatonin 5 MG TABS   Oral   Take 5 mg by mouth at bedtime as needed (for sleep).         . predniSONE (DELTASONE) 10 MG tablet  Take 6 tablets  today, on day 2 take 5 tablets, day 3 take 4 tablets, day 4 take 3 tablets, day 5 take  2 tablets and 1 tablet the last day   21 tablet   0     Allergies Review of patient's allergies indicates no known allergies.  No family history on file.  Social History Social History  Substance Use Topics  . Smoking status: Former Smoker -- 0.50 packs/day    Types: Cigarettes  . Smokeless tobacco: None  . Alcohol Use: No    Review of Systems Constitutional: No fever/chills Cardiovascular: Denies chest pain. Respiratory: Denies shortness of breath. Gastrointestinal: No abdominal pain.  No nausea, no vomiting.  No diarrhea.  No constipation. Positive hemorrhoids Genitourinary: Negative for dysuria. Musculoskeletal: Positive for chronic back pain. Positive for acute back pain, positive for right leg paresthesia Skin:  Negative for rash. Neurological: Negative for headaches, focal weakness or numbness.  10-point ROS otherwise negative.  ____________________________________________   PHYSICAL EXAM:  VITAL SIGNS: ED Triage Vitals  Enc Vitals Group     BP --      Pulse --      Resp --      Temp --      Temp src --      SpO2 --      Weight --      Height --      Head Cir --      Peak Flow --      Pain Score --      Pain Loc --      Pain Edu? --      Excl. in GC? --     Constitutional: Alert and oriented. Well appearing and in no acute distress. Eyes: Conjunctivae are normal. PERRL. EOMI. Head: Atraumatic. Nose: No congestion/rhinnorhea. Neck: No stridor.   Hematological/Lymphatic/Immunilogical: No cervical lymphadenopathy. Cardiovascular: Normal rate, regular rhythm. Grossly normal heart sounds.  Good peripheral circulation. Respiratory: Normal respiratory effort.  No retractions. Lungs CTAB. Gastrointestinal: Soft and nontender. No distention.  No CVA tenderness. Musculoskeletal: Moves upper and lower extremities without any difficulty. On examination of the back there is no gross deformity. There is tenderness on palpation of the L5-S1 and paravertebral muscles bilaterally. Range of motion is difficult secondary to muscle spasms and patient's tolerance of pain. Motor sensory function intact. Neurologic:  Normal speech and language. No gross focal neurologic deficits are appreciated. Reflexes are 2+ bilaterally. Skin:  Skin is warm, dry and intact. No rash noted. Psychiatric: Mood and affect are normal. Speech and behavior are normal.  ____________________________________________   LABS (all labs ordered are listed, but only abnormal results are displayed)  Labs Reviewed  URINALYSIS COMPLETEWITH MICROSCOPIC ([facility] ONLY) - Abnormal; Notable for the following:    Color, Urine AMBER (*)    APPearance CLEAR (*)    Ketones, ur TRACE (*)    Protein, ur 30 (*)    Squamous Epithelial /  LPF 0-5 (*)    All other components within normal limits  POC URINE PREG, ED  POCT PREGNANCY, URINE   RADIOLOGY  Deferred today. LS-spine x-ray performed in February 2017 was reviewed. There was minimal flattening in L5-S1 area but no evidence of fracture. ____________________________________________   PROCEDURES  Procedure(s) performed: None  Critical Care performed: No  ____________________________________________   INITIAL IMPRESSION / ASSESSMENT AND PLAN / ED COURSE  Pertinent labs & imaging results that were available during my care of the patient were reviewed by me and considered  in my medical decision making (see chart for details).  Patient expressed some relief with Toradol and Valium 5 mg by mouth while in the emergency room. Patient was referred to Dr. Deeann Saint who is on call for orthopedics today. Patient was encouraged to call and make an appointment for further evaluation and treatment of her chronic back pain. Patient was discharged on Valium 2 mg one every 8 hours for muscle spasms for 3 days if needed and also started on a prednisone taper. ____________________________________________   FINAL CLINICAL IMPRESSION(S) / ED DIAGNOSES  Final diagnoses:  Chronic right-sided low back pain with right-sided sciatica      NEW MEDICATIONS STARTED DURING THIS VISIT:  New Prescriptions   DIAZEPAM (VALIUM) 2 MG TABLET    Take 1 tablet (2 mg total) by mouth every 8 (eight) hours as needed for muscle spasms.   PREDNISONE (DELTASONE) 10 MG TABLET    Take 6 tablets  today, on day 2 take 5 tablets, day 3 take 4 tablets, day 4 take 3 tablets, day 5 take  2 tablets and 1 tablet the last day     Note:  This document was prepared using Dragon voice recognition software and may include unintentional dictation errors.    Tommi Rumps, PA-C 03/23/16 1044  Jene Every, MD 03/23/16 [phone]

## 2016-03-23 NOTE — ED Notes (Signed)
States she has had lower back pain for about 3 years  Pain became worse over the past 2-3 days  Pain is in lower back and radiates into leg denies any recent trauma or urinary sx

## 2016-03-23 NOTE — Discharge Instructions (Signed)
Chronic Back Pain  When back pain lasts longer than 3 months, it is called chronic back pain.People with chronic back pain often go through certain periods that are more intense (flare-ups).  CAUSES Chronic back pain can be caused by wear and tear (degeneration) on different structures in your back. These structures include:  The bones of your spine (vertebrae) and the joints surrounding your spinal cord and nerve roots (facets).  The strong, fibrous tissues that connect your vertebrae (ligaments). Degeneration of these structures may result in pressure on your nerves. This can lead to constant pain. HOME CARE INSTRUCTIONS  Avoid bending, heavy lifting, prolonged sitting, and activities which make the problem worse.  Take brief periods of rest throughout the day to reduce your pain. Lying down or standing usually is better than sitting while you are resting.  Take over-the-counter or prescription medicines only as directed by your caregiver. SEEK IMMEDIATE MEDICAL CARE IF:   You have weakness or numbness in one of your legs or feet.  You have trouble controlling your bladder or bowels.  You have nausea, vomiting, abdominal pain, shortness of breath, or fainting.   This information is not intended to replace advice given to you by your health care provider. Make sure you discuss any questions you have with your health care provider.   Document Released: 10/21/2004 Document Revised: 12/06/2011 Document Reviewed: 03/03/2015 Elsevier Interactive Patient Education 2016 [employer]   Call Dr. Rondel BatonMiller's office for an appointment for further evaluation and treatment of your chronic back pain. His number and office is listed on your discharge papers. You may continue with ice or heat to your lower back as needed. Continue taking Valium 2 mg every 8 hours as needed for muscle spasms and begin taking prednisone today as directed.

## 2016-08-09 ENCOUNTER — Encounter: Payer: Self-pay | Admitting: Intensive Care

## 2016-08-09 ENCOUNTER — Emergency Department
Admission: EM | Admit: 2016-08-09 | Discharge: 2016-08-09 | Disposition: A | Discharge status: Home / Self Care | Payer: Medicaid Other | Attending: Student in an Organized Health Care Education/Training Program | Admitting: Student in an Organized Health Care Education/Training Program

## 2016-08-09 DIAGNOSIS — R338 Other retention of urine: Secondary | ICD-10-CM

## 2016-08-09 DIAGNOSIS — K9189 Other postprocedural complications and disorders of digestive system: Secondary | ICD-10-CM | POA: Diagnosis present

## 2016-08-09 DIAGNOSIS — Z79899 Other long term (current) drug therapy: Secondary | ICD-10-CM | POA: Diagnosis not present

## 2016-08-09 DIAGNOSIS — R339 Retention of urine, unspecified: Secondary | ICD-10-CM | POA: Diagnosis not present

## 2016-08-09 DIAGNOSIS — Z87891 Personal history of nicotine dependence: Secondary | ICD-10-CM | POA: Insufficient documentation

## 2016-08-09 LAB — URINALYSIS COMPLETE WITH MICROSCOPIC (ARMC ONLY)
BACTERIA UA: NONE SEEN
Bilirubin Urine: NEGATIVE
Glucose, UA: NEGATIVE mg/dL
Hgb urine dipstick: NEGATIVE
Ketones, ur: NEGATIVE mg/dL
Leukocytes, UA: NEGATIVE
Nitrite: NEGATIVE
PH: 6 (ref 5.0–8.0)
PROTEIN: NEGATIVE mg/dL
Specific Gravity, Urine: 1.013 (ref 1.005–1.030)

## 2016-08-09 MED ORDER — HYDROCODONE-ACETAMINOPHEN 5-325 MG PO TABS
1.0000 | ORAL_TABLET | Freq: Once | ORAL | Status: AC
  Administered 2016-08-09: 1 via ORAL
  Filled 2016-08-09: qty 1

## 2016-08-09 MED ORDER — POLYETHYLENE GLYCOL 3350 17 GM/SCOOP PO POWD: 1.0000 | Freq: Once | ORAL | 0 refills | Status: AC

## 2016-08-09 MED ORDER — HYDROCODONE-ACETAMINOPHEN 5-325 MG PO TABS: 1.0000 | ORAL_TABLET | ORAL | 0 refills | Status: DC | PRN

## 2016-08-09 NOTE — ED Provider Notes (Signed)
[facility] Emergency Department Provider Note    None    (approximate)  I have reviewed the triage vital signs and the nursing notes.   HISTORY  Chief Complaint Post-op Problem    HPI Saunders GlanceJessica D Wawrzyniak is a 25 y.o. female  who presents status post laparoscopic appendectomy on November 8. States the child having difficulty voiding this Saturday. She woke up this morning and was able to pass urine. Throughout the day she saw her bladder continuously filled was unable to urinate. Denies any dysuria over the past several days. Denies any fevers. Is having recurrent abdominal pain that has been fairly well managed on Norco that she was given postop. She denies any vaginal discharge. Denies any vaginal bleeding. Denies any nausea or vomiting.   Past Medical History:  Diagnosis Date  . Dysplasia of cervix 2013   dyplasia has resolved since birth of son in 2013.  [name]. Psoriasis of scalp 2004   mainly scalp, uses sun for treatment   History reviewed. No pertinent family history. Past Surgical History:  Procedure Laterality Date  . ABDOMINAL SURGERY    . APPENDECTOMY    . CESAREAN SECTION    . CESAREAN SECTION N/A 2013  . CYSTOSCOPY N/A 05/23/2015   Procedure: CYSTOSCOPY;  Surgeon: Nadara Mustardobert P Harris, MD;  Location: [facility] ORS;  Service: Gynecology;  Laterality: N/A;  . ESOPHAGOGASTRODUODENOSCOPY N/A 07/02/2015   Procedure: ESOPHAGOGASTRODUODENOSCOPY (EGD) looking in the esophagus stomach and upper small intestine with a lighted tube to evaluate and treat;  Surgeon: Christena DeemMartin U Skulskie, MD;  [facility];  Service: Endoscopy;  Laterality: N/A;  . LAPAROSCOPY N/A 05/23/2015   Procedure: LAPAROSCOPY DIAGNOSTIC;  Surgeon: Nadara Mustardobert P Harris, MD;  Location: [facility] ORS;  Service: Gynecology;  Laterality: N/A;   Patient Active Problem List   Diagnosis Date Noted  . Epigastric pain 06/30/2015      Prior to Admission medications   Medication Sig Start Date End Date  Taking? Authorizing Provider  acetaminophen (TYLENOL) 325 MG tablet Take 650 mg by mouth every 4 (four) hours as needed for mild pain, moderate pain, fever or headache.     Historical Provider, MD  Calcium Carb-Cholecalciferol (CALCIUM 600-D PO) Take 1 tablet by mouth daily at 12 noon.    Historical Provider, MD  diazepam (VALIUM) 2 MG tablet Take 1 tablet (2 mg total) by mouth every 8 (eight) hours as needed for muscle spasms. 03/23/16   Tommi Rumpshonda L Summers, PA-C  DULoxetine (CYMBALTA) 20 MG capsule Take 20 mg by mouth daily.    Historical Provider, MD  ferrous sulfate 325 (65 FE) MG tablet Take 325 mg by mouth daily.    Historical Provider, MD  medroxyPROGESTERone (DEPO-PROVERA) 150 MG/ML injection Inject 1 mL into the muscle every 3 (three) months.    Historical Provider, MD  Melatonin 5 MG TABS Take 5 mg by mouth at bedtime as needed (for sleep).    Historical Provider, MD  predniSONE (DELTASONE) 10 MG tablet Take 6 tablets  today, on day 2 take 5 tablets, day 3 take 4 tablets, day 4 take 3 tablets, day 5 take  2 tablets and 1 tablet the last day 03/23/16   Tommi Rumpshonda L Summers, PA-C    Allergies Patient has no known allergies.    Social History Social History  Substance Use Topics  . Smoking status: Former Smoker    Packs/day: 0.50    Types: Cigarettes  . Smokeless tobacco: Never Used  . Alcohol use No  Review of Systems Patient denies headaches, rhinorrhea, blurry vision, numbness, shortness of breath, chest pain, edema, cough, abdominal pain, nausea, vomiting, diarrhea, dysuria, fevers, rashes or hallucinations unless otherwise stated above in HPI. ____________________________________________   PHYSICAL EXAM:  VITAL SIGNS: Vitals:   08/09/16 1516  BP: 136/87  Pulse: 90  Resp: 18  Temp: 98.7 F (37.1 C)    Constitutional: Alert and oriented.  in no acute distress. Eyes: Conjunctivae are normal. PERRL. EOMI. Head: Atraumatic. Nose: No congestion/rhinnorhea. Mouth/Throat:  Mucous membranes are moist.  Oropharynx non-erythematous. Neck: No stridor. Painless ROM. No cervical spine tenderness to palpation Hematological/Lymphatic/Immunilogical: No cervical lymphadenopathy. Cardiovascular: Normal rate, regular rhythm. Grossly normal heart sounds.  Good peripheral circulation. Respiratory: Normal respiratory effort.  No retractions. Lungs CTAB. Gastrointestinal:Soft and appropriate in the postop setting with well-healing laparoscopic port incisions.. No distention. No abdominal bruits. No CVA tenderness. Musculoskeletal: No lower extremity tenderness nor edema.  No joint effusions. Neurologic:  Normal speech and language. No gross focal neurologic deficits are appreciated. No gait instability. Skin:  Skin is warm, dry and intact. No rash noted. Psychiatric: Mood and affect are normal. Speech and behavior are normal.  ____________________________________________   LABS (all labs ordered are listed, but only abnormal results are displayed)  Results for orders placed or performed during the hospital encounter of 08/09/16 (from the past 24 hour(s))  Urinalysis complete, with microscopic ([facility] only)     Status: Abnormal   Collection Time: 08/09/16  5:56 PM  Result Value Ref Range   Color, Urine YELLOW (A) YELLOW   APPearance CLEAR (A) CLEAR   Glucose, UA NEGATIVE NEGATIVE mg/dL   Bilirubin Urine NEGATIVE NEGATIVE   Ketones, ur NEGATIVE NEGATIVE mg/dL   Specific Gravity, Urine 1.013 1.005 - 1.030   Hgb urine dipstick NEGATIVE NEGATIVE   pH 6.0 5.0 - 8.0   Protein, ur NEGATIVE NEGATIVE mg/dL   Nitrite NEGATIVE NEGATIVE   Leukocytes, UA NEGATIVE NEGATIVE   RBC / HPF 0-5 0 - 5 RBC/hpf   WBC, UA 0-5 0 - 5 WBC/hpf   Bacteria, UA NONE SEEN NONE SEEN   Squamous Epithelial / LPF 0-5 (A) NONE SEEN   Mucous PRESENT    Budding Yeast PRESENT    ____________________________________________  EKG____________________________________________   PROCEDURES  Procedure(s)  performed: none Procedures    Critical Care performed: no ____________________________________________   INITIAL IMPRESSION / ASSESSMENT AND PLAN / ED COURSE  Pertinent labs & imaging results that were available during my care of the patient were reviewed by me and considered in my medical decision making (see chart for details).  DDX: Acute urinary retention, UTI, postop , location  Saunders GlanceJessica D Kasler is a 25 y.o. who presents to the ED with complaint of acute urinary retention.  Patient is AFVSS in ED. Exam as above. Given current presentation have considered the above differential.  Abdominal exam appears appropriate in the postop setting. Do not feel CT imaging clinically indicated. Bladder scan showed greater than 600. Foley catheter placed due to acute urinary retention with improvement in her symptoms. Urinalysis does not show evidence of any infectious process or hematuria. Urinary retention likely secondary to multiple medications. Will leave Foley in place to provide referral for urology.  Have discussed with the patient and available family all diagnostics and treatments performed thus far and all questions were answered to the best of my ability. The patient demonstrates understanding and agreement with plan.   Clinical Course      ____________________________________________   FINAL  CLINICAL IMPRESSION(S) / ED DIAGNOSES  Final diagnoses:  Acute urinary retention      NEW MEDICATIONS STARTED DURING THIS VISIT:  New Prescriptions   No medications on file     Note:  This document was prepared using Dragon voice recognition software and may include unintentional dictation errors.    Willy Eddy, MD 08/09/16 [phone]

## 2016-08-09 NOTE — ED Triage Notes (Signed)
Patient reports she had a laparoscopy and appendectomy on November 8th and starting Saturday has been unable to void. Pt talked to her OB GYN and they told her to come to ER to be catheterized. Denies N/V. C/o pain in abdomen when trying to urinate

## 2016-08-11 ENCOUNTER — Ambulatory Visit (INDEPENDENT_AMBULATORY_CARE_PROVIDER_SITE_OTHER): Payer: Medicaid Other | Admitting: Urology

## 2016-08-11 VITALS — BP 123/77 | HR 125 | Ht 63.0 in | Wt 157.5 lb

## 2016-08-11 DIAGNOSIS — R339 Retention of urine, unspecified: Secondary | ICD-10-CM

## 2016-08-11 NOTE — Progress Notes (Signed)
08/11/2016 10:52 AM   Mandy Hansen 10-Jan-1991 [id number]  Referring provider: Oswaldo ConroyAbby Daneele Bender, MD [address] [place], [place] [id number]  Chief Complaint  Patient presents with  . Follow-up    ER urinary retention     HPI: The patient is a 25 year old female presents today for evaluation of postoperative urinary retention. She recently underwent a laparoscopic appendectomy. She return to the emergency room after discharge was 600 cc urinary retention and a Foley catheter was placed.  She has no previous genitourinary history. She has never had urinary retention prior to this. She has never had a urinary tract infection. At baseline, she feels that she empties her bladder and notes a good stream. She denies pain with voiding.   PMH: Past Medical History:  Diagnosis Date  . Dysplasia of cervix 2013   dyplasia has resolved since birth of son in 2013.  [name]. Psoriasis of scalp 2004   mainly scalp, uses sun for treatment    Surgical History: Past Surgical History:  Procedure Laterality Date  . ABDOMINAL SURGERY    . APPENDECTOMY    . CESAREAN SECTION    . CESAREAN SECTION N/A 2013  . CYSTOSCOPY N/A 05/23/2015   Procedure: CYSTOSCOPY;  Surgeon: Nadara Mustardobert P Harris, MD;  Location: [facility] ORS;  Service: Gynecology;  Laterality: N/A;  . ESOPHAGOGASTRODUODENOSCOPY N/A 07/02/2015   Procedure: ESOPHAGOGASTRODUODENOSCOPY (EGD) looking in the esophagus stomach and upper small intestine with a lighted tube to evaluate and treat;  Surgeon: Christena DeemMartin U Skulskie, MD;  [facility];  Service: Endoscopy;  Laterality: N/A;  . LAPAROSCOPY N/A 05/23/2015   Procedure: LAPAROSCOPY DIAGNOSTIC;  Surgeon: Nadara Mustardobert P Harris, MD;  Location: [facility] ORS;  Service: Gynecology;  Laterality: N/A;    Home Medications:    Medication List       Accurate as of 08/11/16 10:52 AM. Always use your most recent med list.          acetaminophen 325 MG tablet Commonly known as:  TYLENOL Take  650 mg by mouth every 4 (four) hours as needed for mild pain, moderate pain, fever or headache.   CALCIUM 600-D PO Take 1 tablet by mouth daily at 12 noon.   cyclobenzaprine 5 MG tablet Commonly known as:  FLEXERIL Take 5 mg by mouth.   diazepam 2 MG tablet Commonly known as:  VALIUM Take 1 tablet (2 mg total) by mouth every 8 (eight) hours as needed for muscle spasms.   docusate sodium 100 MG capsule Commonly known as:  COLACE TAKE 1 CAPSULE (100 MG TOTAL) BY MOUTH DAILY AS NEEDED FOR CONSTIPATION.   DULoxetine 20 MG capsule Commonly known as:  CYMBALTA Take 20 mg by mouth daily.   ferrous sulfate 325 (65 FE) MG tablet Take 325 mg by mouth daily.   gabapentin 300 MG capsule Commonly known as:  NEURONTIN TAKE 2 CAPSULES BY MOUTH TWICE DAILY   HYDROcodone-acetaminophen 5-325 MG tablet Commonly known as:  NORCO Take 1 tablet by mouth every 4 (four) hours as needed for moderate pain.   medroxyPROGESTERone 150 MG/ML injection Commonly known as:  DEPO-PROVERA Inject 1 mL into the muscle every 3 (three) months.   Melatonin 5 MG Tabs Take 5 mg by mouth at bedtime as needed (for sleep).   phenazopyridine 200 MG tablet Commonly known as:  PYRIDIUM Take 200 mg by mouth.   predniSONE 10 MG tablet Commonly known as:  DELTASONE Take 6 tablets  today, on day 2 take 5 tablets, day  3 take 4 tablets, day 4 take 3 tablets, day 5 take  2 tablets and 1 tablet the last day   tamsulosin 0.4 MG Caps capsule Commonly known as:  FLOMAX Take 0.4 mg by mouth.       Allergies: No Known Allergies  Family History: No family history on file.  Social History:  reports that she has quit smoking. Her smoking use included Cigarettes. She smoked 0.50 packs per day. She has never used smokeless tobacco. She reports that she uses drugs, including Marijuana. She reports that she does not drink alcohol.  ROS: UROLOGY Frequent Urination?: No Hard to postpone urination?: No Burning/pain with  urination?: No Get up at night to urinate?: No Leakage of urine?: No Urine stream starts and stops?: No Trouble starting stream?: No Do you have to strain to urinate?: No Blood in urine?: No Urinary tract infection?: No Sexually transmitted disease?: No Injury to kidneys or bladder?: No Painful intercourse?: No Weak stream?: No Currently pregnant?: No Vaginal bleeding?: No Last menstrual period?: n  Gastrointestinal Nausea?: No Vomiting?: No Indigestion/heartburn?: No Diarrhea?: No Constipation?: Yes  Constitutional Fever: No Night sweats?: No Weight loss?: No Fatigue?: Yes  Skin Skin rash/lesions?: No Itching?: No  Eyes Blurred vision?: No Double vision?: No  Ears/Nose/Throat Sore throat?: No Sinus problems?: No  Hematologic/Lymphatic Swollen glands?: No Easy bruising?: No  Cardiovascular Leg swelling?: No Chest pain?: No  Respiratory Cough?: No Shortness of breath?: No  Endocrine Excessive thirst?: No  Musculoskeletal Back pain?: Yes Joint pain?: No  Neurological Headaches?: No Dizziness?: No  Psychologic Depression?: No Anxiety?: Yes  Physical Exam: BP 123/77   Pulse (!) 125   Ht 5\' 3"  (1.6 m)   Wt 157 lb 8 oz (71.4 kg)   BMI 27.90 kg/m   Constitutional:  Alert and oriented, No acute distress. HEENT: [place] AT, moist mucus membranes.  Trachea midline, no masses. Cardiovascular: No clubbing, cyanosis, or edema. Respiratory: Normal respiratory effort, no increased work of breathing. GI: Abdomen is soft, nontender, nondistended, no abdominal masses GU: No CVA tenderness. Foley in place. Clear yellow urine. Skin: No rashes, bruises or suspicious lesions. Lymph: No cervical or inguinal adenopathy. Neurologic: Grossly intact, no focal deficits, moving all 4 extremities. Psychiatric: Normal mood and affect.  Laboratory Data: Lab Results  Component Value Date   WBC 9.6 12/31/2015   HGB 14.7 12/31/2015   HCT 44.3 12/31/2015   MCV 91.8  12/31/2015   PLT 240 12/31/2015    Lab Results  Component Value Date   CREATININE 0.79 12/31/2015    No results found for: PSA  No results found for: TESTOSTERONE  No results found for: HGBA1C  Urinalysis    Component Value Date/Time   COLORURINE YELLOW (A) 08/09/2016 1756   APPEARANCEUR CLEAR (A) 08/09/2016 1756   APPEARANCEUR Clear 12/22/2013 1135   LABSPEC 1.013 08/09/2016 1756   LABSPEC 1.023 12/22/2013 1135   PHURINE 6.0 08/09/2016 1756   GLUCOSEU NEGATIVE 08/09/2016 1756   GLUCOSEU Negative 12/22/2013 1135   HGBUR NEGATIVE 08/09/2016 1756   BILIRUBINUR NEGATIVE 08/09/2016 1756   BILIRUBINUR Negative 12/22/2013 1135   KETONESUR NEGATIVE 08/09/2016 1756   PROTEINUR NEGATIVE 08/09/2016 1756   NITRITE NEGATIVE 08/09/2016 1756   LEUKOCYTESUR NEGATIVE 08/09/2016 1756   LEUKOCYTESUR Negative 12/22/2013 1135     Assessment & Plan:    1. Post operative urinary retention The patient underwent a trial of void in our office today. She was able to pass this and was able to urinate  without difficulty. Retention is likely related to recent surgery, and she can follow up with us as needed as this appears to resolve.  Return if symptoms worsen or fail to improve.  Hildred LaserBrian James Brenyn Petrey, MD  [facility] [address], [address] [place], [place] [address] [phone]

## 2017-01-18 ENCOUNTER — Ambulatory Visit: Payer: Self-pay | Admitting: Obstetrics & Gynecology

## 2017-02-11 IMAGING — US US EXTREM LOW VENOUS*L*
1 series · 13 of 24 positions shown · non-contrast
Comparison: None.

CLINICAL DATA: Left lower extremity pain and edema. History of
varicose veins.



[Series 1: us extrem low venous*left* · 0.06mm/px · 13 of 33 slices shown]
[im 1/33]
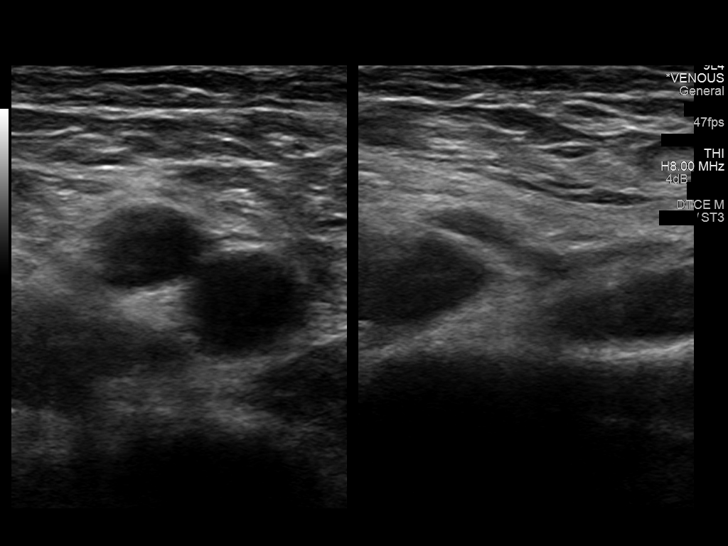
[im 3/33]
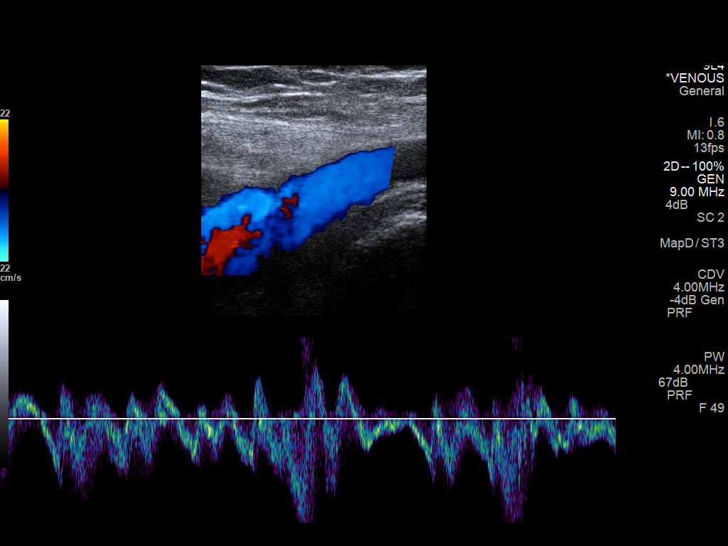
[im 6/33]
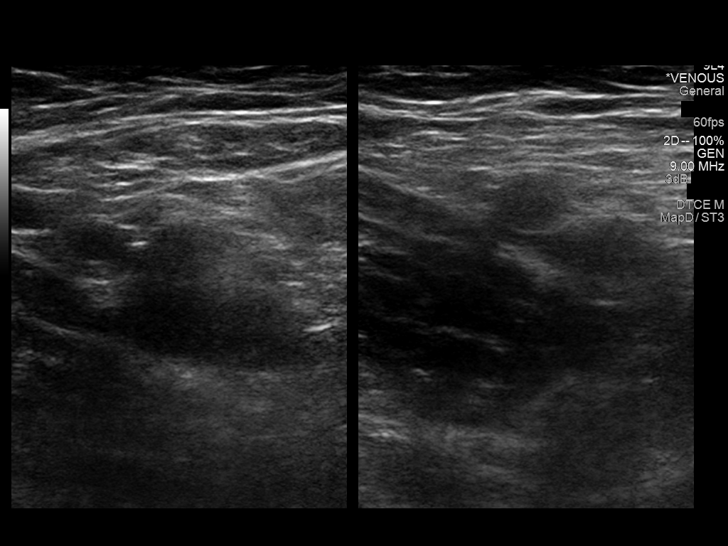
[im 9/33]
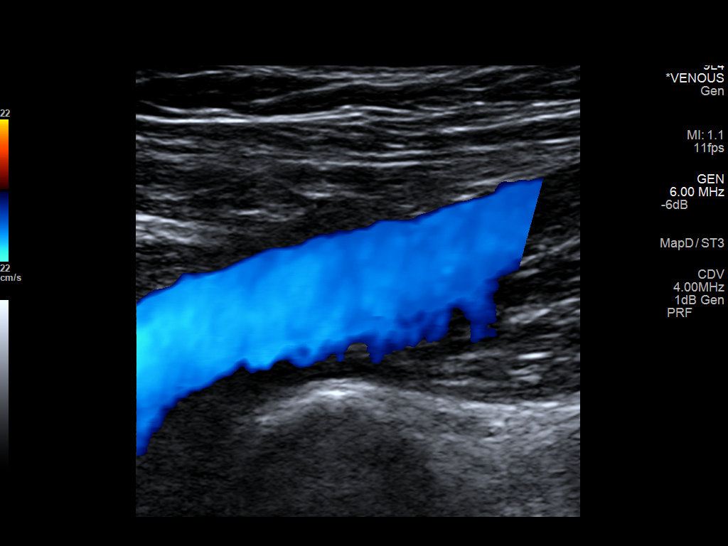
[im 12/33]
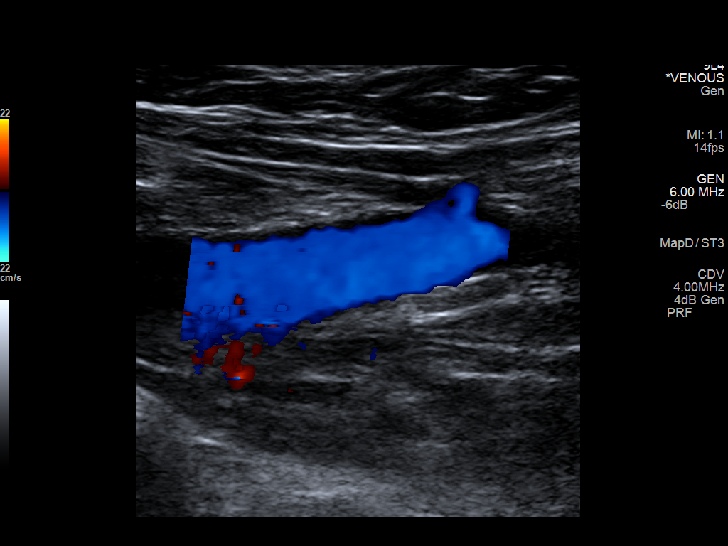
[im 14/33]
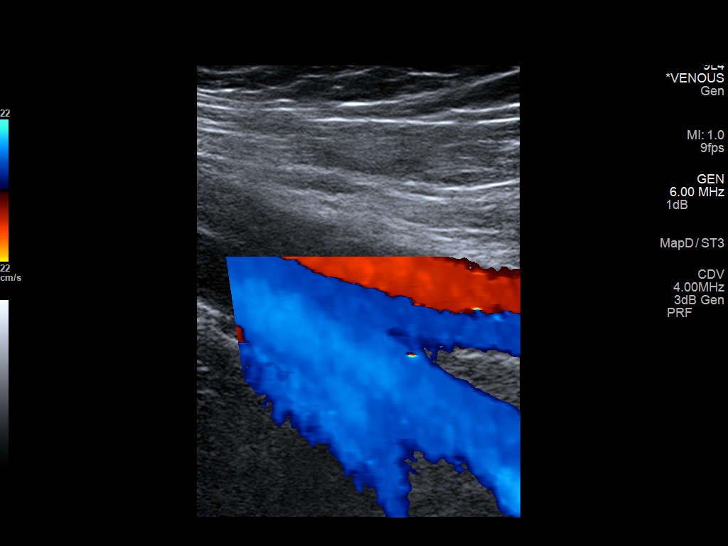
[im 17/33]
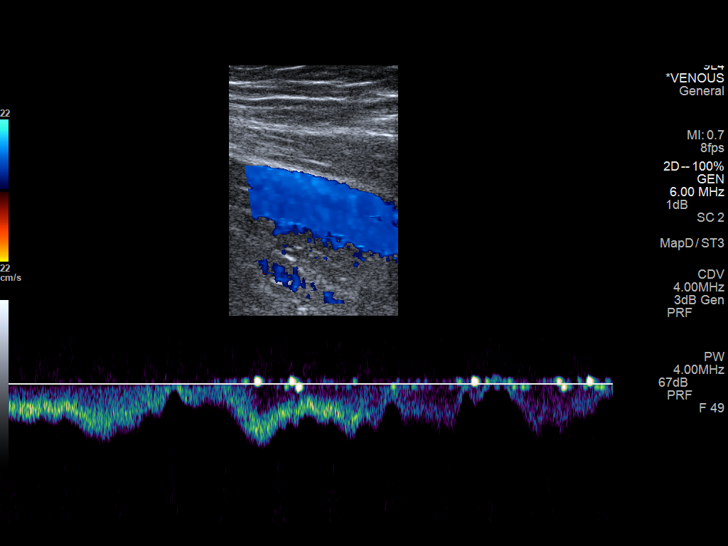
[im 19/33]
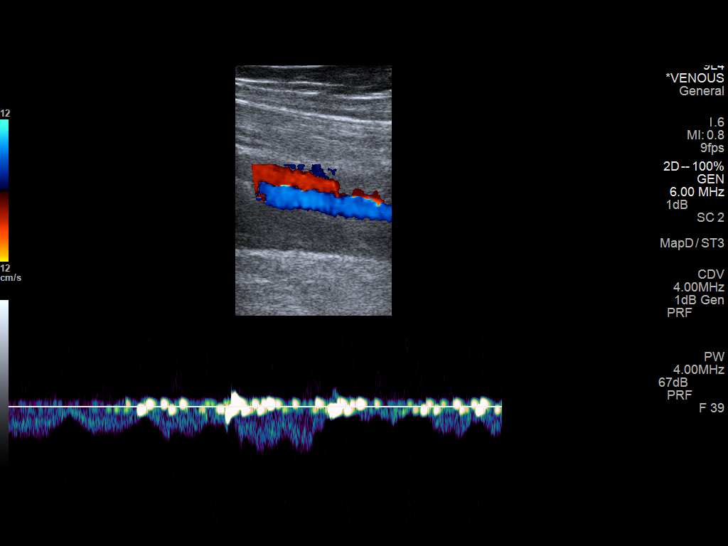
[im 21/33]
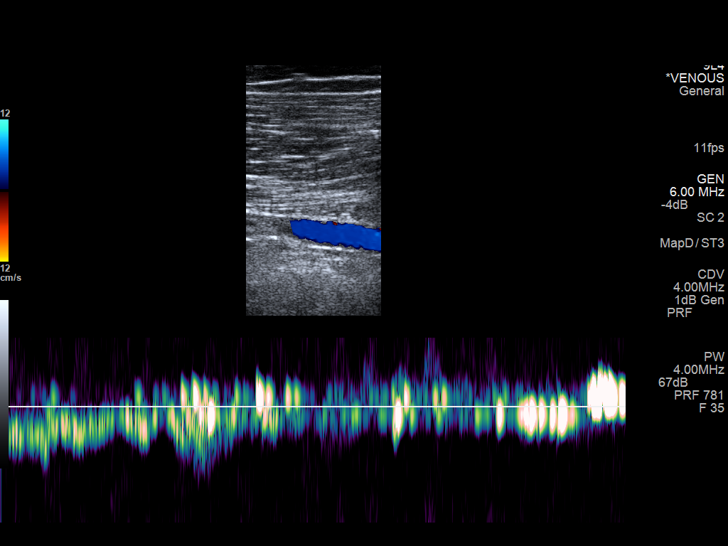
[im 24/33]
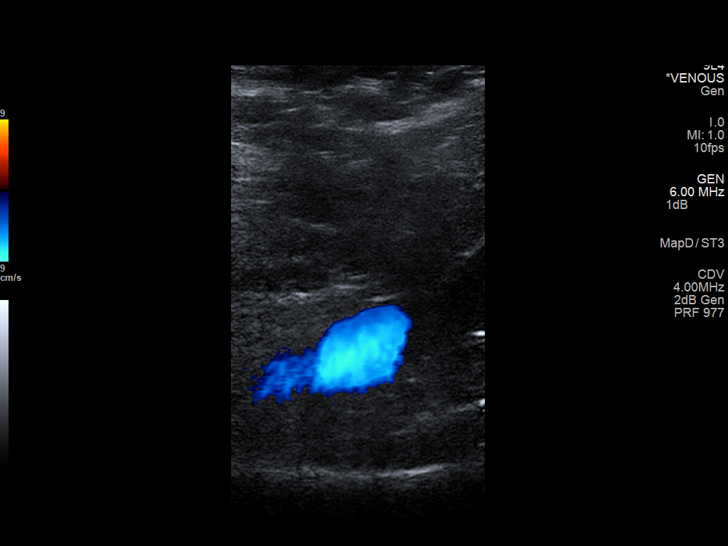
[im 27/33]
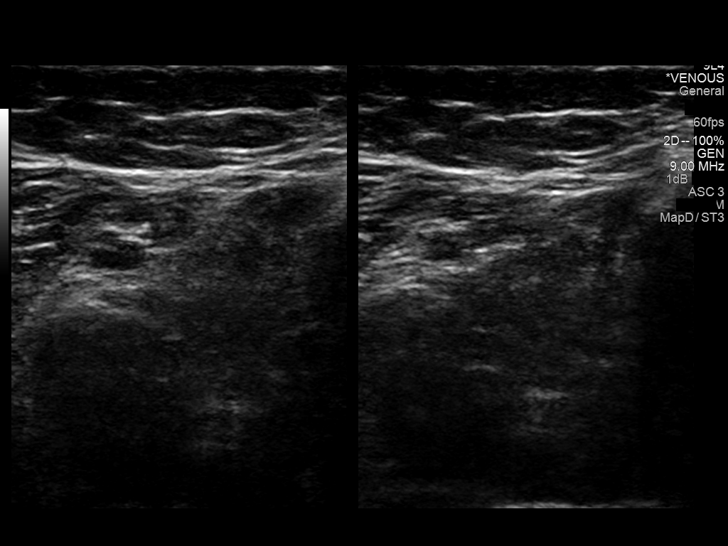
[im 30/33]
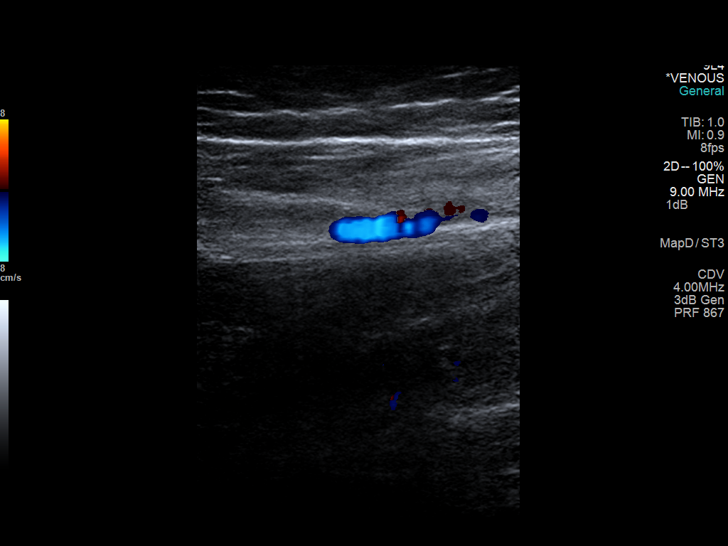
[im 33/33]
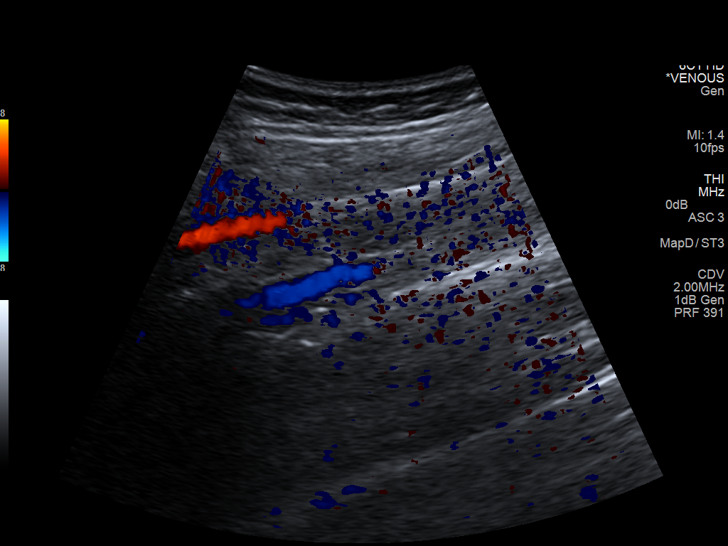

[13 of 24 positions shown; findings below may reference images not displayed]

FINDINGS: Contralateral Common Femoral Vein: Respiratory phasicity is normal
and symmetric with the symptomatic side. No evidence of thrombus.
Normal compressibility.

Common Femoral Vein: No evidence of thrombus. Normal
compressibility, respiratory phasicity and response to augmentation.

Saphenofemoral Junction: No evidence of thrombus. Normal
compressibility and flow on color Doppler imaging.

Profunda Femoral Vein: No evidence of thrombus. Normal
compressibility and flow on color Doppler imaging.

Femoral Vein: No evidence of thrombus. Normal compressibility,
respiratory phasicity and response to augmentation.

Popliteal Vein: No evidence of thrombus. Normal compressibility,
respiratory phasicity and response to augmentation.

Calf Veins: No evidence of thrombus. Normal compressibility and flow
on color Doppler imaging.

Superficial Great Saphenous Vein: No evidence of thrombus. Normal
compressibility and flow on color Doppler imaging.

Venous Reflux:  None.

Other Findings: No evidence of superficial thrombophlebitis or
abnormal fluid collection.
IMPRESSION: No evidence of left lower extremity deep venous thrombosis.

## 2017-06-02 ENCOUNTER — Emergency Department
Admission: EM | Admit: 2017-06-02 | Discharge: 2017-06-02 | Disposition: A | Discharge status: Home / Self Care | Payer: Self-pay | Attending: Emergency Medicine | Admitting: Emergency Medicine

## 2017-06-02 ENCOUNTER — Encounter: Payer: Self-pay | Admitting: Emergency Medicine

## 2017-06-02 ENCOUNTER — Emergency Department: Payer: Self-pay

## 2017-06-02 DIAGNOSIS — Y9389 Activity, other specified: Secondary | ICD-10-CM | POA: Insufficient documentation

## 2017-06-02 DIAGNOSIS — Z79899 Other long term (current) drug therapy: Secondary | ICD-10-CM | POA: Insufficient documentation

## 2017-06-02 DIAGNOSIS — B084 Enteroviral vesicular stomatitis with exanthem: Secondary | ICD-10-CM | POA: Insufficient documentation

## 2017-06-02 DIAGNOSIS — Y998 Other external cause status: Secondary | ICD-10-CM | POA: Insufficient documentation

## 2017-06-02 DIAGNOSIS — Z87891 Personal history of nicotine dependence: Secondary | ICD-10-CM | POA: Insufficient documentation

## 2017-06-02 DIAGNOSIS — Y929 Unspecified place or not applicable: Secondary | ICD-10-CM | POA: Insufficient documentation

## 2017-06-02 DIAGNOSIS — W19XXXA Unspecified fall, initial encounter: Secondary | ICD-10-CM | POA: Insufficient documentation

## 2017-06-02 DIAGNOSIS — S0083XA Contusion of other part of head, initial encounter: Secondary | ICD-10-CM | POA: Insufficient documentation

## 2017-06-02 LAB — POCT RAPID STREP A: STREPTOCOCCUS, GROUP A SCREEN (DIRECT): NEGATIVE

## 2017-06-02 MED ORDER — KETOROLAC TROMETHAMINE 10 MG PO TABS
10.0000 mg | ORAL_TABLET | Freq: Once | ORAL | Status: AC
Start: 2017-06-02 — End: 2017-06-02
  Administered 2017-06-02: 10 mg via ORAL
  Filled 2017-06-02: qty 1

## 2017-06-02 MED ORDER — LIDOCAINE VISCOUS 2 % MT SOLN
15.0000 mL | Freq: Once | OROMUCOSAL | Status: AC
  Administered 2017-06-02: 15 mL via OROMUCOSAL

## 2017-06-02 MED ORDER — KETOROLAC TROMETHAMINE 10 MG PO TABS: 10.0000 mg | ORAL_TABLET | Freq: Four times a day (QID) | ORAL | 0 refills | Status: DC | PRN

## 2017-06-02 MED ORDER — LIDOCAINE VISCOUS 2 % MT SOLN
OROMUCOSAL | Status: AC
  Administered 2017-06-02: 15 mL via OROMUCOSAL
  Filled 2017-06-02: qty 30

## 2017-06-02 NOTE — ED Notes (Signed)
Patient transported to X-ray 

## 2017-06-02 NOTE — ED Provider Notes (Signed)
[facility] Emergency Department Provider Note   First MD Initiated Contact with Patient 06/02/17 [id number]     (approximate)  I have reviewed the triage vital signs and the nursing notes.   HISTORY  Chief Complaint Oral Swelling   HPI Mandy Hansen is a 26 y.o. female Presents to the emergency department with painful swallowing since being diagnosed with hand-foot-and-mouth on Tuesday. Patient states that her 463-year-old child has hand-foot-and-mouth as well. Patient denies any fever afebrile on presentation. In addition the patient admits to tender area on the chin which began after accidental fall where she struck her knee to her chin on Saturday. Patient states her current pain score 7 out of 10.   Past Medical History:  Diagnosis Date  . Dysplasia of cervix 2013   dyplasia has resolved since birth of son in 2013.  [name]. Psoriasis of scalp 2004   mainly scalp, uses sun for treatment    Patient Active Problem List   Diagnosis Date Noted  . Epigastric pain 06/30/2015    Past Surgical History:  Procedure Laterality Date  . ABDOMINAL SURGERY    . APPENDECTOMY    . CESAREAN SECTION    . CESAREAN SECTION N/A 2013  . CYSTOSCOPY N/A 05/23/2015   Procedure: CYSTOSCOPY;  Surgeon: Nadara Mustardobert P Harris, MD;  Location: [facility] ORS;  Service: Gynecology;  Laterality: N/A;  . ESOPHAGOGASTRODUODENOSCOPY N/A 07/02/2015   Procedure: ESOPHAGOGASTRODUODENOSCOPY (EGD) looking in the esophagus stomach and upper small intestine with a lighted tube to evaluate and treat;  Surgeon: Christena DeemMartin U Skulskie, MD;  [facility];  Service: Endoscopy;  Laterality: N/A;  . LAPAROSCOPY N/A 05/23/2015   Procedure: LAPAROSCOPY DIAGNOSTIC;  Surgeon: Nadara Mustardobert P Harris, MD;  Location: [facility] ORS;  Service: Gynecology;  Laterality: N/A;    Prior to Admission medications   Medication Sig Start Date End Date Taking? Authorizing Provider  acetaminophen (TYLENOL) 325 MG tablet Take 650 mg by mouth  every 4 (four) hours as needed for mild pain, moderate pain, fever or headache.     [provider]  Calcium Carb-Cholecalciferol (CALCIUM 600-D PO) Take 1 tablet by mouth daily at 12 noon.    [provider]  cyclobenzaprine (FLEXERIL) 5 MG tablet Take 5 mg by mouth. 08/10/16   [provider]  diazepam (VALIUM) 2 MG tablet Take 1 tablet (2 mg total) by mouth every 8 (eight) hours as needed for muscle spasms. 03/23/16   Tommi RumpsSummers, Rhonda L, PA-C  docusate sodium (COLACE) 100 MG capsule TAKE 1 CAPSULE (100 MG TOTAL) BY MOUTH DAILY AS NEEDED FOR CONSTIPATION. 05/10/16   [provider]  DULoxetine (CYMBALTA) 20 MG capsule Take 20 mg by mouth daily.    [provider]  ferrous sulfate 325 (65 FE) MG tablet Take 325 mg by mouth daily.    [provider]  gabapentin (NEURONTIN) 300 MG capsule TAKE 2 CAPSULES BY MOUTH TWICE DAILY 07/22/16   [provider]  HYDROcodone-acetaminophen (NORCO) 5-325 MG tablet Take 1 tablet by mouth every 4 (four) hours as needed for moderate pain. Patient not taking: Reported on 08/11/2016 08/09/16   Willy Eddyobinson, Patrick, MD  medroxyPROGESTERone (DEPO-PROVERA) 150 MG/ML injection Inject 1 mL into the muscle every 3 (three) months.    [provider]  Melatonin 5 MG TABS Take 5 mg by mouth at bedtime as needed (for sleep).    [provider]  predniSONE (DELTASONE) 10 MG tablet Take 6 tablets  today, on day 2 take 5  tablets, day 3 take 4 tablets, day 4 take 3 tablets, day 5 take  2 tablets and 1 tablet the last day Patient not taking: Reported on 08/11/2016 03/23/16   Tommi Rumps, PA-C  tamsulosin [facility]) 0.4 MG CAPS capsule Take 0.4 mg by mouth. 08/10/16 08/10/17  [provider]    Allergies no known drug allergies  No family history on file.  Social History Social History  Substance Use Topics  . Smoking status: Former Smoker    Packs/day: 0.50    Types: Cigarettes  .  Smokeless tobacco: Never Used  . Alcohol use No    Review of Systems Constitutional: No fever/chills Eyes: No visual changes. ENT: No sore throat. Cardiovascular: Denies chest pain. Respiratory: Denies shortness of breath. Gastrointestinal: No abdominal pain.  No nausea, no vomiting.  No diarrhea.  No constipation. Genitourinary: Negative for dysuria. Musculoskeletal: Negative for neck pain.  Negative for back pain. Integumentary: Negative for rash. Neurological: Negative for headaches, focal weakness or numbness.  ____________________________________________   PHYSICAL EXAM:  VITAL SIGNS: ED Triage Vitals  Enc Vitals Group     BP 06/02/17 0408 (!) 139/92     Pulse Rate 06/02/17 0408 99     Resp 06/02/17 0408 18     Temp 06/02/17 0408 98.1 F (36.7 C)     Temp Source 06/02/17 0408 Oral     SpO2 06/02/17 0408 99 %     Weight 06/02/17 0407 72.6 kg (160 lb)     Height 06/02/17 0407 1.6 m ( )     Head Circumference --      Peak Flow --      Pain Score 06/02/17 0407 8     Pain Loc --      Pain Edu? --      Excl. in GC? --     Constitutional: Alert and oriented. Well appearing and in no acute distress. Eyes: Conjunctivae are normal. PERRL. EOMI. Head: Small tender swollen area on the chin. Mouth/Throat: Mucous membranes are moist. lesions on the mucosa consistent with hand-foot mouth Oropharynx non-erythematous. Neck: No stridor.   Cardiovascular: Normal rate, regular rhythm. Good peripheral circulation. Grossly normal heart sounds. Respiratory: Normal respiratory effort.  No retractions. Lungs CTAB. Gastrointestinal: Soft and nontender. No distention.  Musculoskeletal: No lower extremity tenderness nor edema. No gross deformities of extremities. Neurologic:  Normal speech and language. No gross focal neurologic deficits are appreciated.  Skin:  Skin is warm, dry and intact. No rash noted. Psychiatric: Mood and affect are normal. Speech and behavior are  normal.  ____________________________________________   LABS (all labs ordered are listed, but only abnormal results are displayed)  Labs Reviewed  CULTURE, GROUP A STREP [facility])  POCT RAPID STREP A     RADIOLOGY I, [place], personally viewed and evaluated these images (plain radiographs) as part of my medical decision making, as well as reviewing the written report by the radiologist.  Dg Mandible 1-3 Views  Result Date: 06/02/2017 CLINICAL DATA:  Recent diagnosis of hand foot and mouth disease. Fall on Saturday with to and injury. EXAM: MANDIBLE - 1-3 VIEW COMPARISON:  None. FINDINGS: There is no evidence of fracture or other focal bone lesions. IMPRESSION: Negative. Electronically Signed   By: Burman Nieves M.D.   On: 06/02/2017 05:02     Procedures   ____________________________________________   INITIAL IMPRESSION / ASSESSMENT AND PLAN / ED COURSE  Pertinent labs & imaging results that were available during my care of the  patient were reviewed by me and considered in my medical decision making (see chart for details).  patient given viscous lidocaine gargle and swallow. Patient will be prescribed the same for home and Toradol.      ____________________________________________  FINAL CLINICAL IMPRESSION(S) / ED DIAGNOSES  Final diagnoses:  Chin contusion, initial encounter  Hand, foot and mouth disease     MEDICATIONS GIVEN DURING THIS VISIT:  Medications  lidocaine (XYLOCAINE) 2 % viscous mouth solution 15 mL (15 mLs Mouth/Throat Given 06/02/17 0438)  lidocaine (XYLOCAINE) 2 % viscous mouth solution 15 mL (15 mLs Mouth/Throat Given 06/02/17 0438)     NEW OUTPATIENT MEDICATIONS STARTED DURING THIS VISIT:  New Prescriptions   No medications on file    Modified Medications   No medications on file    Discontinued Medications   No medications on file     Note:  This document was prepared using Dragon voice recognition software and may  include unintentional dictation errors.    Darci Current, MD 06/02/17 [phone]

## 2017-06-02 NOTE — ED Notes (Signed)
RAPID STEP - NEGATIVE

## 2017-06-02 NOTE — ED Notes (Signed)

## 2017-06-02 NOTE — ED Triage Notes (Signed)
Patient ambulatory to triage with steady gait, without difficulty or distress noted; pt reports recently dx with hand foot and mouth disease; also fell on Saturday and knee hit underneath chin; c/o knot to underside of chin causing sensation of difficulty swallowing/swelling to throat

## 2017-06-04 LAB — CULTURE, GROUP A STREP (THRC)

## 2017-09-14 IMAGING — CT CT ANGIO CHEST
3 of 6 series · 16 of 30 positions shown · IV contrast (APPLIED)
Comparison: CT scan of June 30, 2015.

CLINICAL DATA: Chest pain.

EXAM:
CT ANGIOGRAPHY CHEST
CT ABDOMEN AND PELVIS WITH CONTRAST
TECHNIQUE: Multidetector CT imaging of the chest was performed using the
standard protocol during bolus administration of intravenous
contrast. Multiplanar CT image reconstructions and MIPs were
obtained to evaluate the vascular anatomy. Multidetector CT imaging
of the abdomen and pelvis was performed using the standard protocol
during bolus administration of intravenous contrast.
CONTRAST:  100mL OMNIPAQUE IOHEXOL 350 MG/ML SOLN

[Series 4: routine abd pel with · axial · 0.68mm/px · z∈[-578,-408]mm · 3 of 93 slices shown (1 of 2)]
[im 31/93  lung]
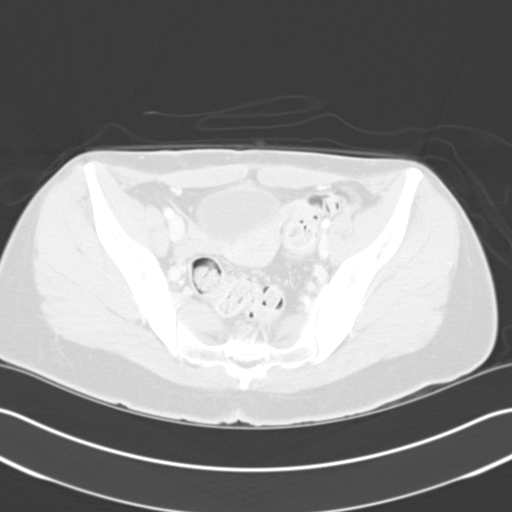
[im 62/93  lung]
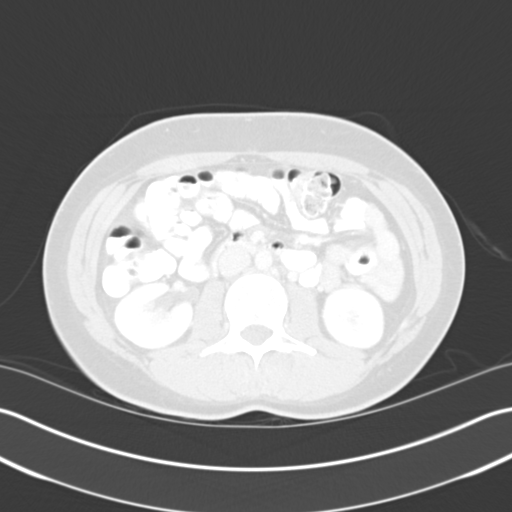
[im 65/93  lung]
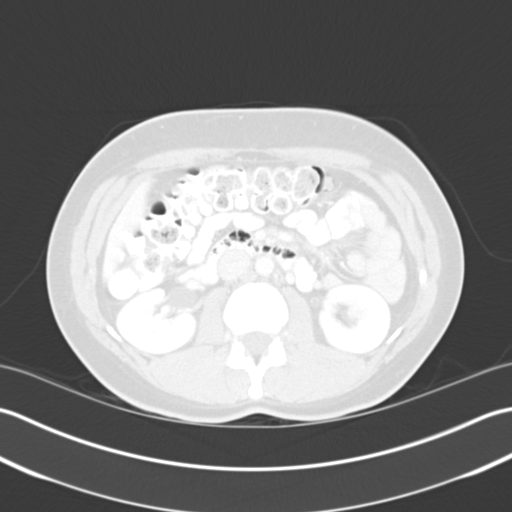

[Series 6: routine abd pel with · axial · 0.68mm/px · z∈[-530,-410]mm · 2 of 80 slices shown (2 of 2)]
[im 40/80  lung]
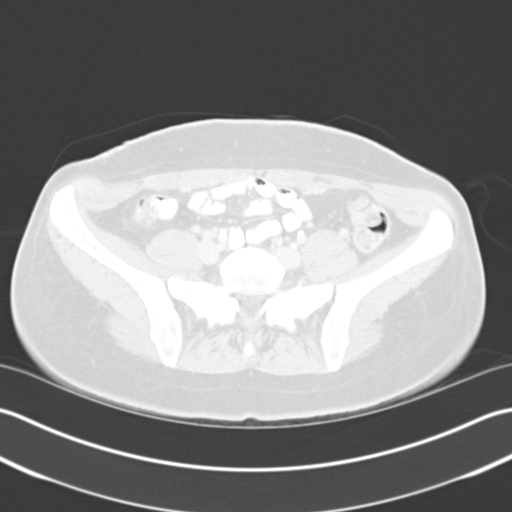
[im 64/80  lung]
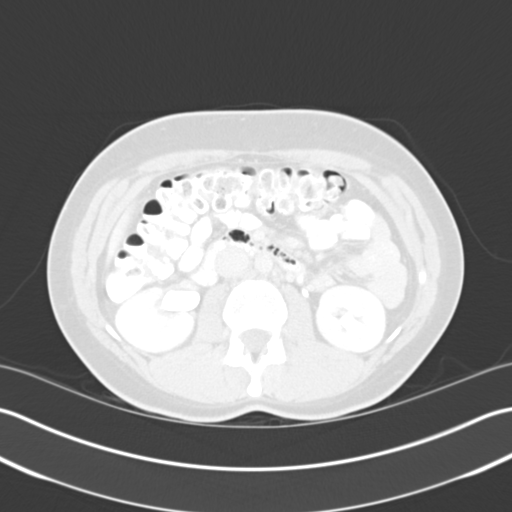

[Series 7: pe 1.0 thins · axial · 0.68mm/px · z∈[-355,-104]mm · 11 of 303 slices shown]
[im 26/303  lung]
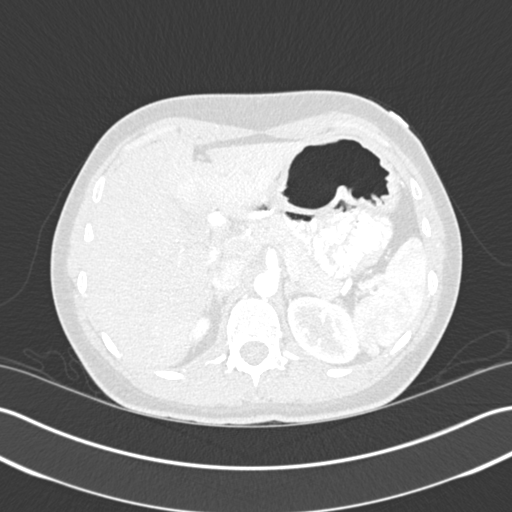
[im 51/303  mediastinal]
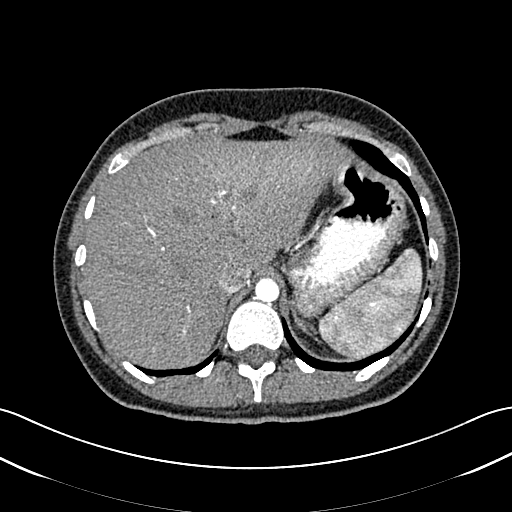
[im 76/303  lung]
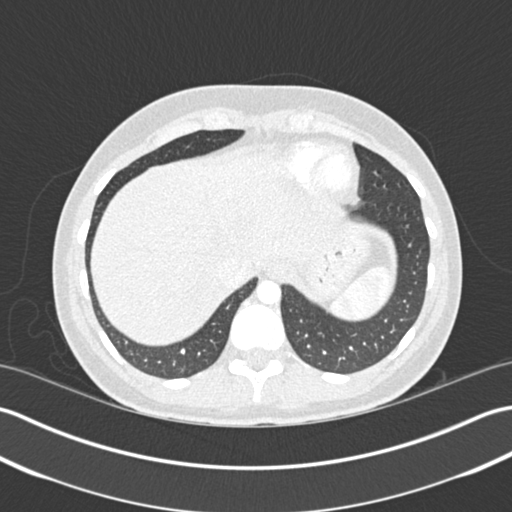
[im 101/303  mediastinal]
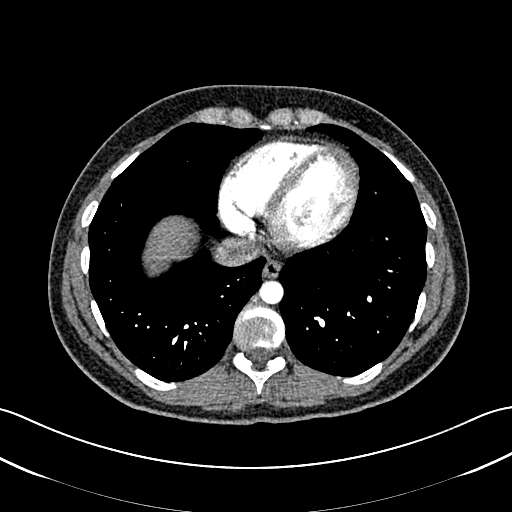
[im 126/303  lung]
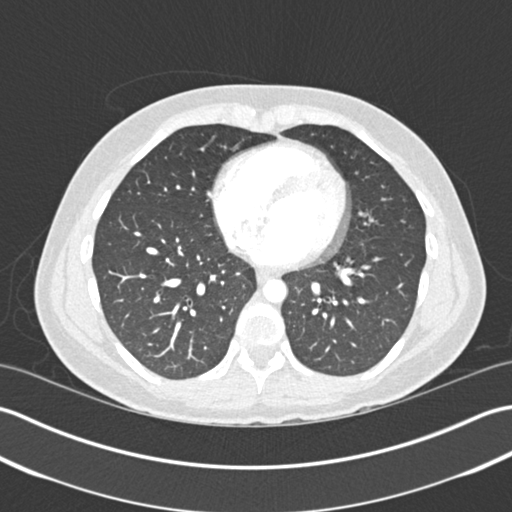
[im 152/303  mediastinal]
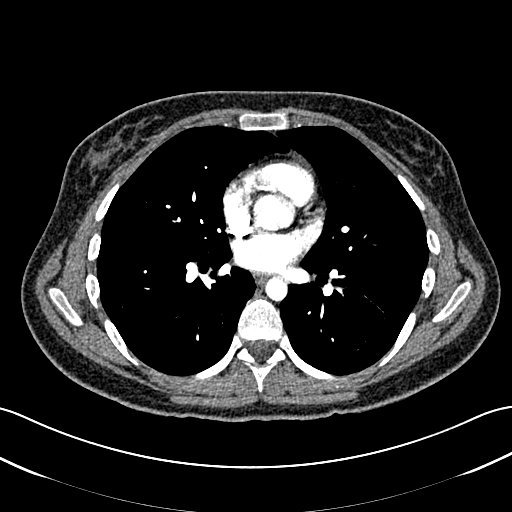
[im 177/303  lung]
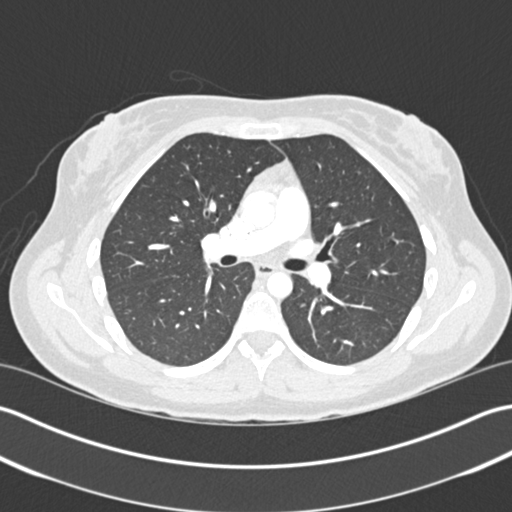
[im 202/303  mediastinal]
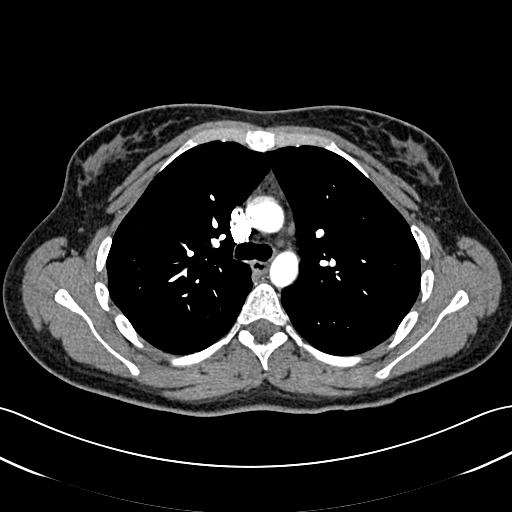
[im 227/303  lung]
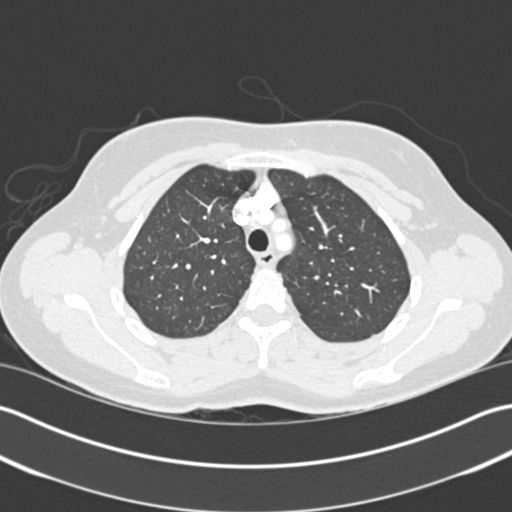
[im 252/303  mediastinal]
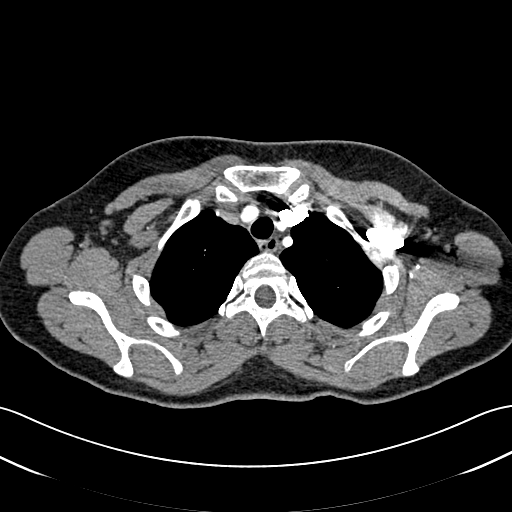
[im 277/303  lung]
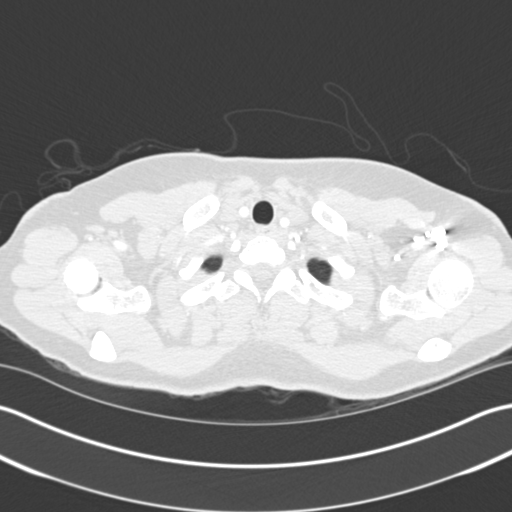

[16 of 30 positions shown; findings below may reference images not displayed]

FINDINGS: CTA CHEST FINDINGS

No pneumothorax or pleural effusion is noted. No acute pulmonary
disease is noted. There is no evidence of pulmonary embolus. There
is no evidence of thoracic aortic dissection or aneurysm. No
mediastinal mass or adenopathy is noted. No significant osseous
abnormality is noted in the chest.

CT ABDOMEN and PELVIS FINDINGS

No gallstones are noted. The liver, spleen and pancreas appear
normal. Adrenal glands and kidneys appear normal. No hydronephrosis
or renal obstruction is noted. The appendix appears normal. There is
no evidence of bowel obstruction. No abnormal fluid collection is
noted. Uterus and ovaries appear normal. Urinary bladder appears
normal. No significant adenopathy is noted. No significant osseous
abnormality is noted in the abdomen or pelvis.

Review of the MIP images confirms the above findings.
IMPRESSION: No evidence of pulmonary embolus. No significant abnormality seen in
the chest.

No significant abnormality seen in the abdomen or pelvis.

## 2017-10-07 ENCOUNTER — Encounter: Payer: Self-pay | Admitting: Emergency Medicine

## 2017-10-07 ENCOUNTER — Other Ambulatory Visit: Payer: Self-pay

## 2017-10-07 ENCOUNTER — Emergency Department: Payer: Medicaid Other

## 2017-10-07 ENCOUNTER — Emergency Department
Admission: EM | Admit: 2017-10-07 | Discharge: 2017-10-07 | Disposition: A | Discharge status: Home / Self Care | Payer: Medicaid Other | Attending: Emergency Medicine | Admitting: Emergency Medicine

## 2017-10-07 DIAGNOSIS — N361 Urethral diverticulum: Secondary | ICD-10-CM | POA: Diagnosis not present

## 2017-10-07 DIAGNOSIS — Z87891 Personal history of nicotine dependence: Secondary | ICD-10-CM | POA: Insufficient documentation

## 2017-10-07 DIAGNOSIS — Z79899 Other long term (current) drug therapy: Secondary | ICD-10-CM | POA: Insufficient documentation

## 2017-10-07 DIAGNOSIS — R102 Pelvic and perineal pain: Secondary | ICD-10-CM | POA: Diagnosis present

## 2017-10-07 LAB — BASIC METABOLIC PANEL
ANION GAP: 9 (ref 5–15)
BUN: 12 mg/dL (ref 6–20)
CHLORIDE: 104 mmol/L (ref 101–111)
CO2: 27 mmol/L (ref 22–32)
Calcium: 9.4 mg/dL (ref 8.9–10.3)
Creatinine, Ser: 0.65 mg/dL (ref 0.44–1.00)
GFR calc non Af Amer: >60 mL/min (ref >60)
Glucose, Bld: 100 mg/dL — ABNORMAL HIGH (ref 65–99)
POTASSIUM: 3.9 mmol/L (ref 3.5–5.1)
Sodium: 140 mmol/L (ref 135–145)

## 2017-10-07 LAB — URINALYSIS, ROUTINE W REFLEX MICROSCOPIC
Bilirubin Urine: NEGATIVE
Glucose, UA: NEGATIVE mg/dL
Hgb urine dipstick: NEGATIVE
KETONES UR: NEGATIVE mg/dL
LEUKOCYTES UA: NEGATIVE
NITRITE: NEGATIVE
PROTEIN: NEGATIVE mg/dL
Specific Gravity, Urine: 1.014 (ref 1.005–1.030)
pH: 7 (ref 5.0–8.0)

## 2017-10-07 LAB — CBC WITH DIFFERENTIAL/PLATELET
BASOS ABS: 0 10*3/uL (ref 0–0.1)
BASOS PCT: 0 %
Eosinophils Absolute: 0.2 10*3/uL (ref 0–0.7)
Eosinophils Relative: 2 %
HEMATOCRIT: 44.4 % (ref 35.0–47.0)
HEMOGLOBIN: 14.5 g/dL (ref 12.0–16.0)
LYMPHS PCT: 17 %
Lymphs Abs: 2 10*3/uL (ref 1.0–3.6)
MCH: 30.8 pg (ref 26.0–34.0)
MCHC: 32.7 g/dL (ref 32.0–36.0)
MCV: 94.2 fL (ref 80.0–100.0)
Monocytes Absolute: 0.6 10*3/uL (ref 0.2–0.9)
Monocytes Relative: 5 %
NEUTROS PCT: 76 %
Neutro Abs: 8.9 10*3/uL — ABNORMAL HIGH (ref 1.4–6.5)
Platelets: 293 10*3/uL (ref 150–440)
RBC: 4.71 MIL/uL (ref 3.80–5.20)
RDW: 13.1 % (ref 11.5–14.5)
WBC: 11.7 10*3/uL — ABNORMAL HIGH (ref 3.6–11.0)

## 2017-10-07 LAB — WET PREP, GENITAL
Clue Cells Wet Prep HPF POC: NONE SEEN
Sperm: NONE SEEN
TRICH WET PREP: NONE SEEN
WBC, Wet Prep HPF POC: NONE SEEN
Yeast Wet Prep HPF POC: NONE SEEN

## 2017-10-07 LAB — PREGNANCY, URINE: PREG TEST UR: NEGATIVE

## 2017-10-07 LAB — CHLAMYDIA/NGC RT PCR (ARMC ONLY)
CHLAMYDIA TR: NOT DETECTED
N GONORRHOEAE: NOT DETECTED

## 2017-10-07 LAB — POCT PREGNANCY, URINE: Preg Test, Ur: NEGATIVE

## 2017-10-07 MED ORDER — SULFAMETHOXAZOLE-TRIMETHOPRIM 800-160 MG PO TABS
1.0000 | ORAL_TABLET | Freq: Once | ORAL | Status: AC
  Administered 2017-10-07: 1 via ORAL
  Filled 2017-10-07: qty 1

## 2017-10-07 MED ORDER — SODIUM CHLORIDE 0.9 % IV BOLUS (SEPSIS)
500.0000 mL | Freq: Once | INTRAVENOUS | Status: AC
  Administered 2017-10-07: 500 mL via INTRAVENOUS

## 2017-10-07 MED ORDER — HYDROMORPHONE HCL 1 MG/ML IJ SOLN
INTRAMUSCULAR | Status: AC
  Administered 2017-10-07: 1 mg via INTRAVENOUS
  Filled 2017-10-07: qty 1

## 2017-10-07 MED ORDER — HYDROMORPHONE HCL 1 MG/ML IJ SOLN
INTRAMUSCULAR | Status: AC
  Filled 2017-10-07: qty 1

## 2017-10-07 MED ORDER — ONDANSETRON HCL 4 MG PO TABS: 4.0000 mg | ORAL_TABLET | Freq: Three times a day (TID) | ORAL | 0 refills | Status: DC | PRN

## 2017-10-07 MED ORDER — HYDROMORPHONE HCL 1 MG/ML IJ SOLN
1.0000 mg | Freq: Once | INTRAMUSCULAR | Status: AC
  Administered 2017-10-07: 1 mg via INTRAVENOUS

## 2017-10-07 MED ORDER — OXYCODONE-ACETAMINOPHEN 5-325 MG PO TABS: 1.0000 | ORAL_TABLET | Freq: Four times a day (QID) | ORAL | 0 refills | Status: DC | PRN

## 2017-10-07 MED ORDER — FENTANYL CITRATE (PF) 100 MCG/2ML IJ SOLN
50.0000 ug | Freq: Once | INTRAMUSCULAR | Status: AC
  Administered 2017-10-07: 50 ug via INTRAVENOUS
  Filled 2017-10-07: qty 2

## 2017-10-07 MED ORDER — DOCUSATE SODIUM 100 MG PO CAPS: 100.0000 mg | ORAL_CAPSULE | Freq: Every day | ORAL | 2 refills | Status: DC | PRN

## 2017-10-07 MED ORDER — SULFAMETHOXAZOLE-TRIMETHOPRIM 800-160 MG PO TABS: 1.0000 | ORAL_TABLET | Freq: Two times a day (BID) | ORAL | 0 refills | Status: DC

## 2017-10-07 MED ORDER — OXYCODONE-ACETAMINOPHEN 5-325 MG PO TABS
1.0000 | ORAL_TABLET | Freq: Once | ORAL | Status: AC
  Administered 2017-10-07: 1 via ORAL
  Filled 2017-10-07: qty 1

## 2017-10-07 MED ORDER — HYDROMORPHONE HCL 1 MG/ML IJ SOLN
INTRAMUSCULAR | Status: AC
  Administered 2017-10-07: 1 mg
  Filled 2017-10-07: qty 1

## 2017-10-07 MED ORDER — IOPAMIDOL (ISOVUE-300) INJECTION 61%
100.0000 mL | Freq: Once | INTRAVENOUS | Status: AC | PRN
  Administered 2017-10-07: 100 mL via INTRAVENOUS
  Filled 2017-10-07: qty 100

## 2017-10-07 NOTE — ED Notes (Signed)
Says when she felt something coming out of vagina she pushed it back in.  Her abd is soft.

## 2017-10-07 NOTE — ED Notes (Signed)
Patient transported to CT 

## 2017-10-07 NOTE — ED Provider Notes (Addendum)
[facility] Emergency Department Provider Note  ____________________________________________   I have reviewed the triage vital signs and the nursing notes. Where available I have reviewed prior notes and, if possible and indicated, outside hospital notes.    HISTORY  Chief Complaint Vaginal Prolapse    HPI Mandy Hansen is a 27 y.o. female who presents with pain in her vagina, for the last 3weeks. Every day all day. + vaginal discharge, white, scant. She is G1P1, no pos preg test. Started with heavy vaginal bleeding after xmas. Has irregular periods b/c of depo. Just came off depo in December.     Location: vagina Radiation:  none Quality:  sharp Duration:  3 weeks Timing:  All the time Severity: significant Associated sxs:  Looked with mirror, sees something in vagina, felt warm PriorTreatment : none  Missed work since [employer] because of pain. Has endometriosis and cervical dysplasia. Has chronic recurrent lower abdominal pain, but this feels different.  Past Medical History:  Diagnosis Date  . Dysplasia of cervix 2013   dyplasia has resolved since birth of son in 2013.  [name] Psoriasis of scalp 2004   mainly scalp, uses sun for treatment    Patient Active Problem List   Diagnosis Date Noted  . Epigastric pain 06/30/2015    Past Surgical History:  Procedure Laterality Date  . ABDOMINAL SURGERY    . APPENDECTOMY    . CESAREAN SECTION    . CESAREAN SECTION N/A 2013  . CYSTOSCOPY N/A 05/23/2015   Procedure: CYSTOSCOPY;  Surgeon: Nadara Mustard, MD;  Location: [facility] ORS;  Service: Gynecology;  Laterality: N/A;  . ESOPHAGOGASTRODUODENOSCOPY N/A 07/02/2015   Procedure: ESOPHAGOGASTRODUODENOSCOPY (EGD) looking in the esophagus stomach and upper small intestine with a lighted tube to evaluate and treat;  Surgeon: Christena Deem, MD;  Location: [facility] ENDOSCOPY;  Service: Endoscopy;  Laterality: N/A;  . LAPAROSCOPY N/A 05/23/2015   Procedure:  LAPAROSCOPY DIAGNOSTIC;  Surgeon: Nadara Mustard, MD;  Location: [facility] ORS;  Service: Gynecology;  Laterality: N/A;    Prior to Admission medications   Medication Sig Start Date End Date Taking? Authorizing Provider  acetaminophen (TYLENOL) 325 MG tablet Take 650 mg by mouth every 4 (four) hours as needed for mild pain, moderate pain, fever or headache.     [provider]  Calcium Carb-Cholecalciferol (CALCIUM 600-D PO) Take 1 tablet by mouth daily at 12 noon.    [provider]  cyclobenzaprine (FLEXERIL) 5 MG tablet Take 5 mg by mouth. 08/10/16   [provider]  diazepam (VALIUM) 2 MG tablet Take 1 tablet (2 mg total) by mouth every 8 (eight) hours as needed for muscle spasms. 03/23/16   Tommi Rumps, PA-C  docusate sodium (COLACE) 100 MG capsule TAKE 1 CAPSULE (100 MG TOTAL) BY MOUTH DAILY AS NEEDED FOR CONSTIPATION. 05/10/16   [provider]  DULoxetine (CYMBALTA) 20 MG capsule Take 20 mg by mouth daily.    [provider]  ferrous sulfate 325 (65 FE) MG tablet Take 325 mg by mouth daily.    [provider]  gabapentin (NEURONTIN) 300 MG capsule TAKE 2 CAPSULES BY MOUTH TWICE DAILY 07/22/16   [provider]  HYDROcodone-acetaminophen (NORCO) 5-325 MG tablet Take 1 tablet by mouth every 4 (four) hours as needed for moderate pain. Patient not taking: Reported on 08/11/2016 08/09/16   Willy Eddy, MD  ketorolac (TORADOL) 10 MG tablet Take 1 tablet (10 mg total) by mouth every 6 (six) hours  as needed. 06/02/17   Darci Current, MD  medroxyPROGESTERone (DEPO-PROVERA) 150 MG/ML injection Inject 1 mL into the muscle every 3 (three) months.    [provider]  Melatonin 5 MG TABS Take 5 mg by mouth at bedtime as needed (for sleep).    [provider]  predniSONE (DELTASONE) 10 MG tablet Take 6 tablets  today, on day 2 take 5 tablets, day 3 take 4 tablets, day 4 take 3 tablets, day 5 take  2 tablets and 1  tablet the last day Patient not taking: Reported on 08/11/2016 03/23/16   Tommi Rumps, PA-C    Allergies Patient has no known allergies.  History reviewed. No pertinent family history.  Social History Social History   Tobacco Use  . Smoking status: Former Smoker    Packs/day: 0.50    Types: Cigarettes  . Smokeless tobacco: Never Used  Substance Use Topics  . Alcohol use: No    Alcohol/week: 0.0 oz  . Drug use: Yes    Types: Marijuana    Comment: Once every 3 months    Review of Systems  Constitutional: felt warm Eyes: No visual changes. ENT: No sore throat. No stiff neck no neck pain Cardiovascular: Denies chest pain. Respiratory: Denies shortness of breath. Gastrointestinal:   + vomiting a couple times.  No diarrhea.  No constipation. Genitourinary: Negative for dysuria. Musculoskeletal: Negative lower extremity swelling Skin: Negative for rash. Neurological: Negative for severe headaches, focal weakness or numbness.   ____________________________________________   PHYSICAL EXAM:  VITAL SIGNS: ED Triage Vitals  Enc Vitals Group     BP 10/07/17 1134 (!) 148/97     Pulse Rate 10/07/17 1134 (!) 103     Resp 10/07/17 1134 18     Temp 10/07/17 1134 98.7 F (37.1 C)     Temp Source 10/07/17 1134 Oral     SpO2 10/07/17 1134 99 %     Weight 10/07/17 1135 150 lb (68 kg)     Height 10/07/17 1135 5\' 3"  (1.6 m)     Head Circumference --      Peak Flow --      Pain Score 10/07/17 1134 8     Pain Loc --      Pain Edu? --      Excl. in GC? --     Constitutional: Alert and oriented. Well appearing and in no acute medical distress.  Very tearful and upset Eyes: Conjunctivae are normal Head: Atraumatic HEENT: No congestion/rhinnorhea. Mucous membranes are moist.  Oropharynx non-erythematous Neck:   Nontender with no meningismus, no masses, no stridor Cardiovascular: Normal rate, regular rhythm. Grossly normal heart sounds.  Good peripheral  circulation. Respiratory: Normal respiratory effort.  No retractions. Lungs CTAB. Abdominal: Soft and nontender. No distention. No guarding no rebound Back:  There is no focal tenderness or step off.  there is no midline tenderness there are no lesions noted. there is no CVA tenderness Pelvic exam: Female chaperone present, there is a extruding mass from the upper right area of the vagina, scant discharge.  Very tender.  Reducible.  Comes out when she Valsalva's.  Somewhat indurated but not markedly erythematous, I do not witness an Allis.  Very limited exam when I touch the area patient yells and pulls back therefore, limited evaluation is possible,  Musculoskeletal: No lower extremity tenderness, no upper extremity tenderness. No joint effusions, no DVT signs strong distal pulses no edema Neurologic:  Normal speech and language. No gross focal neurologic  deficits are appreciated.  Skin:  Skin is warm, dry and intact. No rash noted. Psychiatric: Mood and affect are normal. Speech and behavior are normal.  ____________________________________________   LABS (all labs ordered are listed, but only abnormal results are displayed)  Labs Reviewed  URINALYSIS, ROUTINE W REFLEX MICROSCOPIC - Abnormal; Notable for the following components:      Result Value   Color, Urine YELLOW (*)    APPearance HAZY (*)    All other components within normal limits  CBC WITH DIFFERENTIAL/PLATELET - Abnormal; Notable for the following components:   WBC 11.7 (*)    Neutro Abs 8.9 (*)    All other components within normal limits  BASIC METABOLIC PANEL - Abnormal; Notable for the following components:   Glucose, Bld 100 (*)    All other components within normal limits  PREGNANCY, URINE  POCT PREGNANCY, URINE    Pertinent labs  results that were available during my care of the patient were reviewed by me and considered in my medical decision making (see chart for  details). ____________________________________________  EKG  I personally interpreted any EKGs ordered by me or triage  ____________________________________________  RADIOLOGY  Pertinent labs & imaging results that were available during my care of the patient were reviewed by me and considered in my medical decision making (see chart for details). If possible, patient and/or family made aware of any abnormal findings.  No results found. ____________________________________________    PROCEDURES  Procedure(s) performed: None  Procedures  Critical Care performed: None  ____________________________________________   INITIAL IMPRESSION / ASSESSMENT AND PLAN / ED COURSE  Pertinent labs & imaging results that were available during my care of the patient were reviewed by me and considered in my medical decision making (see chart for details).  She with some degree of prolapse of something, unclear what, certainly could be oncologic process although suspicion given her age, does not necessarily appear to be cervical, patient does have a history of chronic pelvic pain etc. however this does appear to be a new complaint.  Patient is very anxious and upset about this, we are giving her more pain medications I have talked to Dr. Dalbert Garnet she will come down and evaluate the patient.  Appreciate consult  ----------------------------------------- 10:05 PM on 10/07/2017 -----------------------------------------  Discussed with OB/GYN, patient's exam was very tender but she is not in a great deal of pain when she is not having pelvic exams.  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  I did therefore call urology.  Dr. Ronne Binning was kind enough to take the consult.  He did evaluate the patient CT scan personally, we also discussed all of her other findings including wet prep,  urinalysis, blood work etc. and symptoms and exam.  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  He states that there is no acute intervention required in terms of surgical other procedures.  He states that Bactrim for 1 week should be sufficient to get the inflammation and possible infection under control, he strongly recommends discharge home with antibiotics and close follow-up with Dr. Jacquelyne Balint in his practice.  We will give the patient all this information.  Patient herself is very relieved to find that this is not oncologic to the extent that we can determine here.  She does understand that this is not a definitive diagnosis but it is the most  likely, and she will follow closely with OB/GYN as well as with neurology, starting with urology on Monday.  Extensive return precautions and follow-up given and understood.    ____________________________________________   FINAL CLINICAL IMPRESSION(S) / ED DIAGNOSES  Final diagnoses:  None      This chart was dictated using voice recognition software.  Despite best efforts to proofread,  errors can occur which can change meaning.      Jeanmarie PlantMcShane, Chistian Kasler A, MD 10/07/17 1627    Jeanmarie PlantMcShane, Tahari Clabaugh A, MD 10/07/17 2207

## 2017-10-07 NOTE — ED Notes (Signed)
OBGYN in with pt. This RN to assist. Pt tolerates exam ok.

## 2017-10-07 NOTE — ED Triage Notes (Signed)
Pt reports that she has had abd pain for the last several weeks, last night she states that it hurt her vagina to sit down. She states that put her hands down there because of the pain and felt a ball of tissue there and pushed it up. She states that she can still feel it.

## 2017-10-07 NOTE — Consult Note (Signed)
Consult History and Physical   SERVICE: Gynecology   Patient Name: Mandy Hansen Patient MRN:   [id number]  CC: Pelvic pain, prolapse  HPI: Mandy Hansen is a 27 y.o. G1P1001 who presents with 3 weeks worsening vaginal pain, new onset prolapse from vagina today with acute exacerbation of pain. She reduced the bulge and came to the ER.  Pain was significant in the ER. With iv dilaudid she was able to tolerate only gentle exam.    Hx of endometriosis, chronic pelvic floor myalgia, and severe pelvic adhesions. She last saw Dr. 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  Prior pLTCS for arrest of descent in 03/2012.  On Depo-provera for contraception and just came off it, did have heavy bleeding in Dec.   Review of Systems: positives in bold GEN:   fevers, chills, weight changes, appetite changes, fatigue, night sweats HEENT:  HA, vision changes, hearing loss, congestion, rhinorrhea, sinus pressure, dysphagia CV:   CP, palpitations PULM:  SOB, cough GI:  abd pain, N/V/D/C, vaginal pain GU:  dysuria, urgency, frequency MSK:  arthralgias, myalgias, back pain, swelling SKIN:  rashes, color changes, pallor NEURO:  numbness, weakness, tingling, seizures, dizziness, tremors PSYCH:  depression, anxiety,  HEME/LYMPH:  easy bruising or bleeding ENDO:  heat/cold intolerance  Past Obstetrical History: OB History    Gravida Para Term Preterm AB Living   1             SAB TAB Ectopic Multiple Live Births                  Past Gynecologic History: Patient's last menstrual period was 09/29/2017.   Past Medical History: Past Medical History:  Diagnosis Date  . Dysplasia of cervix 2013   dyplasia has resolved since birth of son in 2013.  [name] Psoriasis of scalp 2004   mainly scalp, uses sun for treatment    Past Surgical History:   Past Surgical History:  Procedure  Laterality Date  . ABDOMINAL SURGERY    . APPENDECTOMY    . CESAREAN SECTION    . CESAREAN SECTION N/A 2013  . CYSTOSCOPY N/A 05/23/2015   Procedure: CYSTOSCOPY;  Surgeon: Nadara Mustard, MD;  Location: [facility] ORS;  Service: Gynecology;  Laterality: N/A;  . ESOPHAGOGASTRODUODENOSCOPY N/A 07/02/2015   Procedure: ESOPHAGOGASTRODUODENOSCOPY (EGD) looking in the esophagus stomach and upper small intestine with a lighted tube to evaluate and treat;  Surgeon: Christena Deem, MD;  Location: [facility] ENDOSCOPY;  Service: Endoscopy;  Laterality: N/A;  . LAPAROSCOPY N/A 05/23/2015   Procedure: LAPAROSCOPY DIAGNOSTIC;  Surgeon: Nadara Mustard, MD;  Location: [facility] ORS;  Service: Gynecology;  Laterality: N/A;    Family History:  family history is not on file.  Social History:  Social History   Socioeconomic History  . Marital status: Single    Spouse name: Not on file  . Number of children: Not on file  . Years of education: Not on file  . Highest education level: Not on file  Social Needs  . Financial resource strain: Not on file  . Food insecurity - worry: Not on file  . Food insecurity - inability: Not on file  . Transportation needs - medical: Not on file  . Transportation needs - non-medical: Not on file  Occupational History  . Not on file  Tobacco Use  .  Smoking status: Former Smoker    Packs/day: 0.50    Types: Cigarettes  . Smokeless tobacco: Never Used  Substance and Sexual Activity  . Alcohol use: No    Alcohol/week: 0.0 oz  . Drug use: Yes    Types: Marijuana    Comment: Once every 3 months  . Sexual activity: Not on file  Other Topics Concern  . Not on file  Social History Narrative  . Not on file    Home Medications:  Medications reconciled in EPIC  No current facility-administered medications on file prior to encounter.    Current Outpatient Medications on File Prior to Encounter  Medication Sig Dispense Refill  . acetaminophen (TYLENOL) 325 MG tablet Take 650  mg by mouth every 4 (four) hours as needed for mild pain, moderate pain, fever or headache.     . Calcium Carb-Cholecalciferol (CALCIUM 600-D PO) Take 1 tablet by mouth daily at 12 noon.    . cyclobenzaprine (FLEXERIL) 5 MG tablet Take 5 mg by mouth.    . diazepam (VALIUM) 2 MG tablet Take 1 tablet (2 mg total) by mouth every 8 (eight) hours as needed for muscle spasms. 9 tablet 0  . docusate sodium (COLACE) 100 MG capsule TAKE 1 CAPSULE (100 MG TOTAL) BY MOUTH DAILY AS NEEDED FOR CONSTIPATION.  1  . DULoxetine (CYMBALTA) 20 MG capsule Take 20 mg by mouth daily.    . ferrous sulfate 325 (65 FE) MG tablet Take 325 mg by mouth daily.    [name]. gabapentin (NEURONTIN) 300 MG capsule TAKE 2 CAPSULES BY MOUTH TWICE DAILY    . HYDROcodone-acetaminophen (NORCO) 5-325 MG tablet Take 1 tablet by mouth every 4 (four) hours as needed for moderate pain. (Patient not taking: Reported on 08/11/2016) 6 tablet 0  . ketorolac (TORADOL) 10 MG tablet Take 1 tablet (10 mg total) by mouth every 6 (six) hours as needed. 20 tablet 0  . medroxyPROGESTERone (DEPO-PROVERA) 150 MG/ML injection Inject 1 mL into the muscle every 3 (three) months.    . Melatonin 5 MG TABS Take 5 mg by mouth at bedtime as needed (for sleep).    . predniSONE (DELTASONE) 10 MG tablet Take 6 tablets  today, on day 2 take 5 tablets, day 3 take 4 tablets, day 4 take 3 tablets, day 5 take  2 tablets and 1 tablet the last day (Patient not taking: Reported on 08/11/2016) 21 tablet 0  . [DISCONTINUED] pantoprazole (PROTONIX) 40 MG tablet Take 1 tablet (40 mg total) by mouth daily. 60 tablet 0    Allergies:  No Known Allergies  Physical Exam:  Temp:  [98.7 F (37.1 C)] 98.7 F (37.1 C) (01/11 1134) Pulse Rate:  [99-103] 99 (01/11 1426) Resp:  [16-18] 16 (01/11 1426) BP: (135-148)/(85-97) 135/85 (01/11 1426) SpO2:  [99 %] 99 % (01/11 1426) Weight:  [68 kg (150 lb)] 68 kg (150 lb) (01/11 1135)   General Appearance:  Well developed, well nourished, no  acute distress, alert and oriented x3 HEENT:  Normocephalic atraumatic, extraocular movements intact, moist mucous membranes Cardiovascular:  Normal S1/S2, regular rate and rhythm, no murmurs Pulmonary:  clear to auscultation, no wheezes, rales or rhonchi, symmetric air entry, good air exchange Abdomen:  Bowel sounds present, soft, nontender, nondistended, no abnormal masses, no epigastric pain, +mild bilateral lower quadrant pain, +tenderness in suprapubic area Extremities:  Full range of motion, no pedal edema, 2+ distal pulses, no tenderness Skin:  normal coloration and turgor, no rashes, no suspicious skin lesions  noted  Neurologic:  Cranial nerves 2-12 grossly intact, normal muscle tone, strength 5/5 all four extremities Psychiatric:  Normal mood and affect, appropriate, no AH/VH Pelvic:  NEFG, pink anterior vaginal mass, smooth, no injection, mildly erythematous, firm and exquisitely tender to palpation. No vaginal discharge.  Bartholin's glands normal. Skene's glands appear normal.  Labs/Studies:   CBC and Coags:  Lab Results  Component Value Date   WBC 11.7 (H) 10/07/2017   NEUTOPHILPCT 76 10/07/2017   EOSPCT 2 10/07/2017   BASOPCT 0 10/07/2017   LYMPHOPCT 17 10/07/2017   HGB 14.5 10/07/2017   HCT 44.4 10/07/2017   MCV 94.2 10/07/2017   PLT 293 10/07/2017   CMP:  Lab Results  Component Value Date   NA 140 10/07/2017   K 3.9 10/07/2017   CL 104 10/07/2017   CO2 27 10/07/2017   BUN 12 10/07/2017   CREATININE 0.65 10/07/2017   CREATININE 0.79 12/31/2015   CREATININE 0.65 08/10/2015   PROT 7.2 12/31/2015   BILITOT 0.5 12/31/2015   ALT 29 12/31/2015   AST 25 12/31/2015   ALKPHOS 65 12/31/2015   Other Imaging: Ct Pelvis W Contrast  Result Date: 10/07/2017 CLINICAL DATA:  Vaginal pain for 3 weeks.  Vaginal discharge. EXAM: CT PELVIS WITH CONTRAST TECHNIQUE: Multidetector CT imaging of the pelvis was performed using the standard protocol following the bolus  administration of intravenous contrast. CONTRAST:  ISOVUE-300 IOPAMIDOL (ISOVUE-300) INJECTION 61% COMPARISON:  Body CT 01/03/2016 FINDINGS: Urinary Tract: Circumferential thickening of the urinary bladder, which is collapsed. Bowel:  Unremarkable visualized pelvic bowel loops. Vascular/Lymphatic: No pathologically enlarged lymph nodes. No significant vascular abnormality seen. Reproductive: Normal uterus and adnexa. There is a 1.6 x 2.3 x 2.0 cm rim enhancing collection versus low-density mass in the region of vaginal rim. This structure cannot be separated from the urethra. Other:  None. Musculoskeletal: No suspicious bone lesions identified. IMPRESSION: 2.3 cm rim enhancing collection versus low-density mass in the proximal vagina. This may represent an inflamed vaginal or vulvar cyst, vaginal abscess or inflamed urethral diverticulum. Low-density mass such as a mucocele is also a consideration. Electronically Signed   By: Ted Mcalpine M.D.   On: 10/07/2017 21:19     Assessment / Plan:   Mandy Hansen is a 27 y.o. G29P1001 F with anterior vaginal mass that is tender and difficult to evaluate in the ER.   1. Imaging suggests urethral mass, possibly abscess or inflamed diverticulum. It does not appear to be emerging from the Bartholins or Skene's glands. It does not appear to be cystocele. Imaging did not find pathologically enlarged lymph nodes. Bladder wall thickening is unusual in this age group, and her UA did not suggest infection.  2. I recommend discussion with urology. If exam under anesthesia is recommended, we would be happy to take her to the OR with urology.    Thank you for the opportunity to be involved with this pt's care.

## 2017-10-07 NOTE — Discharge Instructions (Signed)
You have what appears to be a small pocket which is inflamed in your urethra which is the tube from your bladder that allows you to urinate.  This is causing pain and pressure into your vagina.  Please take the antibiotics until they are gone.  If you have high fever, worsening pain, vomiting, or symptoms that are otherwise concerning please return to the emergency room.  Please expect that there is a chance he may have a gush of fluid, and that he should wear a pad.  Do not fail please to follow closely with urology and we will also give you information about outpatient follow-up with gynecology as needed

## 2017-10-09 LAB — URINE CULTURE: Culture: <10000 — AB

## 2017-10-11 ENCOUNTER — Emergency Department: Payer: Medicaid Other | Admitting: Certified Registered Nurse Anesthetist

## 2017-10-11 ENCOUNTER — Encounter
Admission: EM | Disposition: A | Discharge status: Home / Self Care | Payer: Self-pay | Source: Home / Self Care | Attending: Emergency Medicine

## 2017-10-11 ENCOUNTER — Emergency Department: Payer: Medicaid Other

## 2017-10-11 ENCOUNTER — Encounter: Payer: Self-pay | Admitting: Medical Oncology

## 2017-10-11 ENCOUNTER — Observation Stay
Admission: EM | Admit: 2017-10-11 | Discharge: 2017-10-12 | Disposition: A | Discharge status: Home / Self Care | Payer: Medicaid Other | Attending: Obstetrics & Gynecology | Admitting: Obstetrics & Gynecology

## 2017-10-11 DIAGNOSIS — Z9049 Acquired absence of other specified parts of digestive tract: Secondary | ICD-10-CM | POA: Insufficient documentation

## 2017-10-11 DIAGNOSIS — N340 Urethral abscess: Secondary | ICD-10-CM | POA: Diagnosis present

## 2017-10-11 DIAGNOSIS — Z9889 Other specified postprocedural states: Secondary | ICD-10-CM | POA: Diagnosis not present

## 2017-10-11 DIAGNOSIS — Z79899 Other long term (current) drug therapy: Secondary | ICD-10-CM | POA: Diagnosis not present

## 2017-10-11 DIAGNOSIS — Z87891 Personal history of nicotine dependence: Secondary | ICD-10-CM | POA: Diagnosis not present

## 2017-10-11 DIAGNOSIS — L409 Psoriasis, unspecified: Secondary | ICD-10-CM | POA: Diagnosis not present

## 2017-10-11 DIAGNOSIS — N368 Other specified disorders of urethra: Secondary | ICD-10-CM | POA: Diagnosis present

## 2017-10-11 DIAGNOSIS — Z793 Long term (current) use of hormonal contraceptives: Secondary | ICD-10-CM | POA: Diagnosis not present

## 2017-10-11 HISTORY — PX: INCISION AND DRAINAGE ABSCESS: SHX5864

## 2017-10-11 LAB — COMPREHENSIVE METABOLIC PANEL
ALBUMIN: 3.9 g/dL (ref 3.5–5.0)
ALK PHOS: 78 U/L (ref 38–126)
ALT: 10 U/L — ABNORMAL LOW (ref 14–54)
ANION GAP: 10 (ref 5–15)
AST: 17 U/L (ref 15–41)
BUN: 9 mg/dL (ref 6–20)
CO2: 22 mmol/L (ref 22–32)
Calcium: 9 mg/dL (ref 8.9–10.3)
Chloride: 106 mmol/L (ref 101–111)
Creatinine, Ser: 0.73 mg/dL (ref 0.44–1.00)
GFR calc Af Amer: >60 mL/min (ref >60)
GFR calc non Af Amer: >60 mL/min (ref >60)
GLUCOSE: 89 mg/dL (ref 65–99)
Potassium: 4.1 mmol/L (ref 3.5–5.1)
Sodium: 138 mmol/L (ref 135–145)
Total Bilirubin: 0.2 mg/dL — ABNORMAL LOW (ref 0.3–1.2)
Total Protein: 7 g/dL (ref 6.5–8.1)

## 2017-10-11 LAB — CBC WITH DIFFERENTIAL/PLATELET
BASOS ABS: 0.1 10*3/uL (ref 0–0.1)
BASOS PCT: 0 %
EOS ABS: 0.2 10*3/uL (ref 0–0.7)
Eosinophils Relative: 1 %
HCT: 40.2 % (ref 35.0–47.0)
HEMOGLOBIN: 13.1 g/dL (ref 12.0–16.0)
Lymphocytes Relative: 10 %
Lymphs Abs: 1.3 10*3/uL (ref 1.0–3.6)
MCH: 30.6 pg (ref 26.0–34.0)
MCHC: 32.7 g/dL (ref 32.0–36.0)
MCV: 93.4 fL (ref 80.0–100.0)
MONOS PCT: 7 %
Monocytes Absolute: 0.8 10*3/uL (ref 0.2–0.9)
NEUTROS ABS: 10 10*3/uL — AB (ref 1.4–6.5)
NEUTROS PCT: 82 %
Platelets: 247 10*3/uL (ref 150–440)
RBC: 4.3 MIL/uL (ref 3.80–5.20)
RDW: 13.2 % (ref 11.5–14.5)
WBC: 12.3 10*3/uL — AB (ref 3.6–11.0)

## 2017-10-11 LAB — URINALYSIS, COMPLETE (UACMP) WITH MICROSCOPIC
BILIRUBIN URINE: NEGATIVE
Glucose, UA: NEGATIVE mg/dL
KETONES UR: NEGATIVE mg/dL
NITRITE: NEGATIVE
PH: 7 (ref 5.0–8.0)
PROTEIN: NEGATIVE mg/dL
Specific Gravity, Urine: 1.003 — ABNORMAL LOW (ref 1.005–1.030)

## 2017-10-11 LAB — PREGNANCY, URINE: Preg Test, Ur: NEGATIVE

## 2017-10-11 LAB — LIPASE, BLOOD: Lipase: 41 U/L (ref 11–51)

## 2017-10-11 SURGERY — INCISION AND DRAINAGE, ABSCESS
Anesthesia: General

## 2017-10-11 MED ORDER — MORPHINE SULFATE (PF) 4 MG/ML IV SOLN
4.0000 mg | Freq: Once | INTRAVENOUS | Status: AC
  Administered 2017-10-11: 4 mg via INTRAVENOUS

## 2017-10-11 MED ORDER — DEXAMETHASONE SODIUM PHOSPHATE 10 MG/ML IJ SOLN
INTRAMUSCULAR | Status: AC
  Filled 2017-10-11: qty 1

## 2017-10-11 MED ORDER — HYDROMORPHONE HCL 1 MG/ML IJ SOLN
0.5000 mg | INTRAMUSCULAR | Status: AC | PRN
  Administered 2017-10-11 (×4): 0.5 mg via INTRAVENOUS

## 2017-10-11 MED ORDER — PROPOFOL 10 MG/ML IV BOLUS
INTRAVENOUS | Status: DC | PRN
  Administered 2017-10-11: 150 mg via INTRAVENOUS

## 2017-10-11 MED ORDER — ONDANSETRON HCL 4 MG/2ML IJ SOLN
INTRAMUSCULAR | Status: DC | PRN
  Administered 2017-10-11: 4 mg via INTRAVENOUS

## 2017-10-11 MED ORDER — LACTATED RINGERS IV SOLN
INTRAVENOUS | Status: DC | PRN
  Administered 2017-10-11: 16:00:00 via INTRAVENOUS

## 2017-10-11 MED ORDER — MIDAZOLAM HCL 2 MG/2ML IJ SOLN
INTRAMUSCULAR | Status: AC
  Filled 2017-10-11: qty 2

## 2017-10-11 MED ORDER — DEXAMETHASONE SODIUM PHOSPHATE 10 MG/ML IJ SOLN
INTRAMUSCULAR | Status: DC | PRN
  Administered 2017-10-11: 10 mg via INTRAVENOUS

## 2017-10-11 MED ORDER — DEXTROSE 5 % IV SOLN
1.0000 g | Freq: Two times a day (BID) | INTRAVENOUS | Status: DC
  Administered 2017-10-11: 1 g via INTRAVENOUS
  Filled 2017-10-11 (×4): qty 1

## 2017-10-11 MED ORDER — ONDANSETRON HCL 4 MG/2ML IJ SOLN
INTRAMUSCULAR | Status: AC
  Filled 2017-10-11: qty 2

## 2017-10-11 MED ORDER — HYDROMORPHONE HCL 1 MG/ML IJ SOLN
1.0000 mg | INTRAMUSCULAR | Status: DC | PRN
  Administered 2017-10-11 – 2017-10-12 (×6): 1 mg via INTRAVENOUS
  Filled 2017-10-11 (×7): qty 1

## 2017-10-11 MED ORDER — FENTANYL CITRATE (PF) 100 MCG/2ML IJ SOLN
INTRAMUSCULAR | Status: AC
  Administered 2017-10-11: 25 ug via INTRAVENOUS
  Filled 2017-10-11: qty 2

## 2017-10-11 MED ORDER — MORPHINE SULFATE (PF) 4 MG/ML IV SOLN
4.0000 mg | Freq: Once | INTRAVENOUS | Status: AC
  Administered 2017-10-11: 4 mg via INTRAVENOUS
  Filled 2017-10-11: qty 1

## 2017-10-11 MED ORDER — ONDANSETRON HCL 4 MG/2ML IJ SOLN
4.0000 mg | Freq: Once | INTRAMUSCULAR | Status: AC
  Administered 2017-10-11: 4 mg via INTRAVENOUS
  Filled 2017-10-11: qty 2

## 2017-10-11 MED ORDER — FENTANYL CITRATE (PF) 100 MCG/2ML IJ SOLN
25.0000 ug | INTRAMUSCULAR | Status: AC | PRN
  Administered 2017-10-11 (×6): 25 ug via INTRAVENOUS

## 2017-10-11 MED ORDER — GADOBENATE DIMEGLUMINE 529 MG/ML IV SOLN
14.0000 mL | Freq: Once | INTRAVENOUS | Status: AC | PRN
  Administered 2017-10-11: 15 mL via INTRAVENOUS

## 2017-10-11 MED ORDER — LACTATED RINGERS IV SOLN: INTRAVENOUS | Status: DC

## 2017-10-11 MED ORDER — PHENYLEPHRINE HCL 10 MG/ML IJ SOLN
INTRAMUSCULAR | Status: DC | PRN
  Administered 2017-10-11: 100 ug via INTRAVENOUS

## 2017-10-11 MED ORDER — MIDAZOLAM HCL 2 MG/2ML IJ SOLN
INTRAMUSCULAR | Status: DC | PRN
  Administered 2017-10-11: 2 mg via INTRAVENOUS

## 2017-10-11 MED ORDER — FENTANYL CITRATE (PF) 100 MCG/2ML IJ SOLN
INTRAMUSCULAR | Status: DC | PRN
  Administered 2017-10-11: 25 ug via INTRAVENOUS
  Administered 2017-10-11: 50 ug via INTRAVENOUS
  Administered 2017-10-11: 25 ug via INTRAVENOUS

## 2017-10-11 MED ORDER — SODIUM CHLORIDE 0.9 % IV BOLUS (SEPSIS)
1000.0000 mL | Freq: Once | INTRAVENOUS | Status: AC
  Administered 2017-10-11: 1000 mL via INTRAVENOUS

## 2017-10-11 MED ORDER — HYDROMORPHONE HCL 1 MG/ML IJ SOLN
1.0000 mg | Freq: Once | INTRAMUSCULAR | Status: AC
  Administered 2017-10-11: 1 mg via INTRAVENOUS

## 2017-10-11 MED ORDER — CEFAZOLIN SODIUM-DEXTROSE 2-3 GM-%(50ML) IV SOLR
INTRAVENOUS | Status: DC | PRN
  Administered 2017-10-11: 2 g via INTRAVENOUS

## 2017-10-11 MED ORDER — LIDOCAINE HCL (PF) 1 % IJ SOLN
INTRAMUSCULAR | Status: AC
  Filled 2017-10-11: qty 5

## 2017-10-11 MED ORDER — KETOROLAC TROMETHAMINE 30 MG/ML IJ SOLN
INTRAMUSCULAR | Status: DC | PRN
  Administered 2017-10-11: 30 mg via INTRAVENOUS

## 2017-10-11 MED ORDER — FENTANYL CITRATE (PF) 100 MCG/2ML IJ SOLN
INTRAMUSCULAR | Status: AC
  Filled 2017-10-11: qty 2

## 2017-10-11 MED ORDER — CEFAZOLIN SODIUM 1 G IJ SOLR
INTRAMUSCULAR | Status: AC
  Filled 2017-10-11: qty 20

## 2017-10-11 MED ORDER — HYDROMORPHONE HCL 1 MG/ML IJ SOLN
INTRAMUSCULAR | Status: AC
  Filled 2017-10-11: qty 1

## 2017-10-11 MED ORDER — OXYCODONE HCL 5 MG PO TABS
5.0000 mg | ORAL_TABLET | ORAL | Status: DC | PRN
  Administered 2017-10-11: 5 mg via ORAL
  Administered 2017-10-12: 10 mg via ORAL
  Administered 2017-10-12: 5 mg via ORAL
  Administered 2017-10-12: 10 mg via ORAL
  Administered 2017-10-12: 5 mg via ORAL
  Filled 2017-10-11: qty 2
  Filled 2017-10-11: qty 1
  Filled 2017-10-11 (×2): qty 2
  Filled 2017-10-11: qty 1

## 2017-10-11 MED ORDER — IBUPROFEN 600 MG PO TABS
600.0000 mg | ORAL_TABLET | Freq: Four times a day (QID) | ORAL | Status: DC
  Administered 2017-10-11 – 2017-10-12 (×3): 600 mg via ORAL
  Filled 2017-10-11 (×3): qty 1

## 2017-10-11 MED ORDER — GENTAMICIN SULFATE 40 MG/ML IJ SOLN: INTRAVENOUS | Status: DC

## 2017-10-11 MED ORDER — LACTATED RINGERS IV SOLN
INTRAVENOUS | Status: DC
  Administered 2017-10-12: 09:00:00 via INTRAVENOUS

## 2017-10-11 MED ORDER — DOXYCYCLINE HYCLATE 100 MG IV SOLR
100.0000 mg | Freq: Two times a day (BID) | INTRAVENOUS | Status: AC
  Administered 2017-10-11: 100 mg via INTRAVENOUS
  Filled 2017-10-11: qty 100

## 2017-10-11 MED ORDER — PROPOFOL 10 MG/ML IV BOLUS
INTRAVENOUS | Status: AC
  Filled 2017-10-11: qty 20

## 2017-10-11 MED ORDER — HYDROMORPHONE HCL 1 MG/ML IJ SOLN: 0.5000 mg | Freq: Once | INTRAMUSCULAR | Status: DC

## 2017-10-11 MED ORDER — LIDOCAINE HCL (CARDIAC) 20 MG/ML IV SOLN
INTRAVENOUS | Status: DC | PRN
  Administered 2017-10-11: 50 mg via INTRAVENOUS

## 2017-10-11 MED ORDER — HYDROMORPHONE HCL 1 MG/ML IJ SOLN
INTRAMUSCULAR | Status: AC
  Administered 2017-10-11: 0.5 mg via INTRAVENOUS
  Filled 2017-10-11: qty 1

## 2017-10-11 MED ORDER — MORPHINE SULFATE (PF) 4 MG/ML IV SOLN
INTRAVENOUS | Status: AC
Start: 2017-10-11 — End: 2017-10-11
  Administered 2017-10-11: 4 mg via INTRAVENOUS
  Filled 2017-10-11: qty 1

## 2017-10-11 MED ORDER — SODIUM CHLORIDE 0.9 % IJ SOLN
INTRAMUSCULAR | Status: AC
  Filled 2017-10-11: qty 10

## 2017-10-11 MED ORDER — KETOROLAC TROMETHAMINE 30 MG/ML IJ SOLN
INTRAMUSCULAR | Status: AC
  Filled 2017-10-11: qty 1

## 2017-10-11 MED ORDER — ONDANSETRON HCL 4 MG/2ML IJ SOLN: 4.0000 mg | Freq: Once | INTRAMUSCULAR | Status: DC | PRN

## 2017-10-11 SURGICAL SUPPLY — 13 items
GAUZE PACKING 1/4 X5 YD (GAUZE/BANDAGES/DRESSINGS) ×3 IMPLANT
GLOVE SKINSENSE NS SZ6.5 (GLOVE) ×2
GLOVE SKINSENSE STRL SZ6.5 (GLOVE) ×1 IMPLANT
GLOVE SURG SYN 6.5 ES PF (GLOVE) ×3 IMPLANT
GOWN STRL REUS W/ TWL LRG LVL3 (GOWN DISPOSABLE) ×2 IMPLANT
GOWN STRL REUS W/TWL LRG LVL3 (GOWN DISPOSABLE) ×4
NS IRRIG 500ML POUR BTL (IV SOLUTION) ×3 IMPLANT
PACK DNC HYST (MISCELLANEOUS) ×3 IMPLANT
PAD OB MATERNITY 4.3X12.25 (PERSONAL CARE ITEMS) ×3 IMPLANT
PAD PREP 24X41 OB/GYN DISP (PERSONAL CARE ITEMS) ×3 IMPLANT
STRAP SAFETY BODY (MISCELLANEOUS) ×3 IMPLANT
TOWEL OR 17X26 4PK STRL BLUE (TOWEL DISPOSABLE) ×3 IMPLANT
TRAY FOLEY CATH SILVER 16FR LF (SET/KITS/TRAYS/PACK) ×3 IMPLANT

## 2017-10-11 NOTE — ED Notes (Signed)
Sharon CNA from [facility] came and obtained pt belongings at this time.

## 2017-10-11 NOTE — H&P (Signed)
History and Physical  Mandy Hansen is an 27 y.o. female.  HPI: patient presents again to the ER with complaints of severe vaginal pain and a bulge at her vaginal opening. 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 She was started on bactrim and discharged home. However her symptoms have worsened and she returned today to the ER. As she arrived at the ER she felt a gush of fluid and blood from the buldge. Patient says that the pain feels like someone is pinching her clitoris. She did not have abdominal pain before but now she has started to have some pain. She reports a small amount of pain with urination, but she is urinating more frequently because the pressure of a full bladder makes her vaginal bulge pain worse. She denies constipation. She denies fevers at home.  Denies abnormal vaginal discharge. 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    Past Medical History:  Diagnosis Date  . Dysplasia of cervix 2013   dyplasia has resolved since birth of son in 2013.  [name] Psoriasis of scalp 2004   mainly scalp, uses sun for treatment    Past Surgical History:  Procedure Laterality Date  . ABDOMINAL SURGERY    . APPENDECTOMY    . CESAREAN SECTION    . CESAREAN SECTION N/A 2013  . CYSTOSCOPY N/A 05/23/2015   Procedure: CYSTOSCOPY;  Surgeon: Gae Dry, MD;  Location: [facility] ORS;  Service: Gynecology;  Laterality: N/A;  . ESOPHAGOGASTRODUODENOSCOPY N/A 07/02/2015   Procedure: ESOPHAGOGASTRODUODENOSCOPY (EGD) looking in the esophagus stomach and upper small intestine with a lighted tube to evaluate and treat;  Surgeon: Lollie Sails, MD;  Location: [facility] ENDOSCOPY;  Service: Endoscopy;  Laterality: N/A;  . LAPAROSCOPY N/A 05/23/2015   Procedure: LAPAROSCOPY DIAGNOSTIC;  Surgeon: Gae Dry, MD;  Location: [facility] ORS;  Service: Gynecology;  Laterality: N/A;   OB History    Gravida Para Term Preterm AB Living   1 1 1     1    SAB TAB Ectopic Multiple Live  Births           1    1 low transverse cesarean section  Gynecological History History of endometriosis No history of fibroids or ovarian cysts  Family History No family history on file.  Social History:  reports that she has quit smoking. Her smoking use included cigarettes. She smoked 0.50 packs per day. she has never used smokeless tobacco. She reports that she uses drugs. Drug: Marijuana. She reports that she does not drink alcohol.  Allergies: No Known Allergies  Medications: I have reviewed the patient's current medications.  Results for orders placed or performed during the hospital encounter of 10/11/17 (from the past 48 hour(s))  CBC with Differential     Status: Abnormal   Collection Time: 10/11/17  8:46 AM  Result Value Ref Range   WBC 12.3 (H) 3.6 - 11.0 K/uL   RBC 4.30 3.80 - 5.20 MIL/uL   Hemoglobin 13.1 12.0 - 16.0 g/dL   HCT 40.2 35.0 - 47.0 %   MCV 93.4 80.0 - 100.0 fL   MCH 30.6 26.0 - 34.0 pg   MCHC 32.7 32.0 - 36.0 g/dL   RDW 13.2 11.5 - 14.5 %   Platelets 247 150 - 440 K/uL   Neutrophils Relative % 82 %   Neutro Abs 10.0 (H) 1.4 - 6.5 K/uL   Lymphocytes Relative 10 %   Lymphs Abs 1.3 1.0 - 3.6 K/uL  Monocytes Relative 7 %   Monocytes Absolute 0.8 0.2 - 0.9 K/uL   Eosinophils Relative 1 %   Eosinophils Absolute 0.2 0 - 0.7 K/uL   Basophils Relative 0 %   Basophils Absolute 0.1 0 - 0.1 K/uL    Comment: Performed at [facility], [address] [place], [place] [address]  Comprehensive metabolic panel     Status: Abnormal   Collection Time: 10/11/17  8:46 AM  Result Value Ref Range   Sodium 138 135 - 145 mmol/L   Potassium 4.1 3.5 - 5.1 mmol/L   Chloride 106 101 - 111 mmol/L   CO2 22 22 - 32 mmol/L   Glucose, Bld 89 65 - 99 mg/dL   BUN 9 6 - 20 mg/dL   Creatinine, Ser 0.73 0.44 - 1.00 mg/dL   Calcium 9.0 8.9 - 10.3 mg/dL   Total Protein 7.0 6.5 - 8.1 g/dL   Albumin 3.9 3.5 - 5.0 g/dL   AST 17 15 - 41 U/L   ALT 10 (L) 14 - 54 U/L    Alkaline Phosphatase 78 38 - 126 U/L   Total Bilirubin 0.2 (L) 0.3 - 1.2 mg/dL   GFR calc non Af Amer >60 >60 mL/min   GFR calc Af Amer >60 >60 mL/min    Comment: (NOTE) The eGFR has been calculated using the CKD EPI equation. This calculation has not been validated in all clinical situations. eGFR's persistently <60 mL/min signify possible Chronic Kidney Disease.    Anion gap 10 5 - 15    Comment: Performed at [facility], [address] [place], [place] [address]  Lipase, blood     Status: None   Collection Time: 10/11/17  8:46 AM  Result Value Ref Range   Lipase 41 11 - 51 U/L    Comment: Performed at [facility], [address] [place], [place] [address]  Urinalysis, Complete w Microscopic     Status: Abnormal   Collection Time: 10/11/17  9:08 AM  Result Value Ref Range   Color, Urine STRAW (A) YELLOW   APPearance CLEAR (A) CLEAR   Specific Gravity, Urine 1.003 (L) 1.005 - 1.030   pH 7.0 5.0 - 8.0   Glucose, UA NEGATIVE NEGATIVE mg/dL   Hgb urine dipstick MODERATE (A) NEGATIVE   Bilirubin Urine NEGATIVE NEGATIVE   Ketones, ur NEGATIVE NEGATIVE mg/dL   Protein, ur NEGATIVE NEGATIVE mg/dL   Nitrite NEGATIVE NEGATIVE   Leukocytes, UA TRACE (A) NEGATIVE   RBC / HPF 0-5 0 - 5 RBC/hpf   WBC, UA 0-5 0 - 5 WBC/hpf   Bacteria, UA RARE (A) NONE SEEN   Squamous Epithelial / LPF 0-5 (A) NONE SEEN    Comment: Performed at [facility], [address] [place], [place] [address]    Mr Pelvis W Wo Contrast  Result Date: 10/11/2017 CLINICAL DATA:  Low-density cystic lesion in the lower vaginal/vulvar region on CT. Vaginal pain x3 weeks, with bulging/swelling in this area. EXAM: MRI PELVIS WITHOUT AND WITH CONTRAST TECHNIQUE: Multiplanar multisequence MR imaging of the pelvis was performed both before and after administration of intravenous contrast. CONTRAST:  28m MULTIHANCE GADOBENATE DIMEGLUMINE 529 MG/ML IV SOLN COMPARISON:  CT pelvis dated  10/07/2017. FINDINGS: Urinary Tract:  Bladder is within normal limits. Bowel:  Visualized bowel is unremarkable. Vascular/Lymphatic: No evidence of aneurysm. No suspicious pelvic lymphadenopathy. Reproductive:  Uterus is within normal limits. Left ovary is within normal limits. Right ovary is notable for a 1.7  cm dominant follicle (series 4/image 13), physiologic. Other:  Small volume pelvic ascites. 8 x 11 x 4 mm residual cystic lesion, at the midline, approximating the urethral meatus (series 7/image 33). This is significantly improved from the prior, when it measured 1.6 x 2.3 x 2.0 cm. Overall appearance and location favors a resolving a Skene's gland cyst/abscess, with a lower vaginal wall (Bartholin's gland) cyst/abscess considered less likely. Musculoskeletal: No focal osseous lesions. IMPRESSION: Improving cystic lesion at the urethral meatus, now measuring 8 x 11 x 4 mm, favoring a resolving Skene's gland cyst/abscess. Electronically Signed   By: Julian Hy M.D.   On: 10/11/2017 13:25    Review of Systems  Constitutional: Negative for chills, fever, malaise/fatigue and weight loss.  HENT: Negative for congestion, hearing loss and sinus pain.   Eyes: Negative for blurred vision and double vision.  Respiratory: Negative for cough, sputum production, shortness of breath and wheezing.   Cardiovascular: Negative for chest pain, palpitations, orthopnea and leg swelling.  Gastrointestinal: Negative for abdominal pain, constipation, diarrhea, nausea and vomiting.  Genitourinary: Positive for dysuria and frequency. Negative for flank pain, hematuria and urgency.  Musculoskeletal: Negative for back pain, falls and joint pain.  Skin: Negative for itching and rash.  Neurological: Negative for dizziness and headaches.  Psychiatric/Behavioral: Negative for depression, substance abuse and suicidal ideas. The patient is not nervous/anxious.    Blood pressure (!) 142/80, pulse 78, temperature 98.2 F  (36.8 C), temperature source Oral, resp. rate 16, height 5' 3"  (1.6 m), weight 150 lb (68 kg), last menstrual period 09/29/2017, SpO2 99 %.   Physical Exam  Nursing note and vitals reviewed. Constitutional: She is oriented to person, place, and time. She appears well-developed and well-nourished.  HENT:  Head: Normocephalic and atraumatic.  Cardiovascular: Normal rate and regular rhythm.  Respiratory: Effort normal and breath sounds normal.  GI: Soft. Bowel sounds are normal.  Genitourinary:  Genitourinary Comments: Bulge just below urethra, firm, indurated. Small eruption site which is likely the source of the vaginal bleeding and purulent drainage earlier, but no active bleeding or drainage.   Musculoskeletal: Normal range of motion.  Neurological: She is alert and oriented to person, place, and time.  Skin: Skin is warm and dry.  Psychiatric: She has a normal mood and affect. Her behavior is normal. Judgment and thought content normal.    Assessment/Plan:   26 yo G1P1001 with a skene's gland abscess/cyst.  Attempted to drain abscess at bedside. However patient did not tolerate injection of the abscess with lidocaine and the procedure was discontinued.  Will take the patient to the OR for incision and drainage of Skene's gland abscess/cyst.   Jaedin Regina R Dalbert Stillings 10/11/2017, 3:35 PM

## 2017-10-11 NOTE — ED Notes (Signed)
Report was given to OR RN at this time by [facility]

## 2017-10-11 NOTE — Transfer of Care (Signed)
Immediate Anesthesia Transfer of Care Note  Patient: Mandy Hansen  Procedure(s) Performed: INCISION AND DRAINAGE SKENE CYST (N/A )  Patient Location: PACU  Anesthesia Type:General  Level of Consciousness: awake, alert  and oriented  Airway & Oxygen Therapy: Patient Spontanous Breathing and Patient connected to face mask oxygen  Post-op Assessment: Report given to RN and Post -op Vital signs reviewed and stable  Post vital signs: Reviewed and stable  Last Vitals:  Vitals:   10/11/17 1417 10/11/17 1707  BP:    Pulse: 78   Resp:    Temp:  37.2 C  SpO2: 99%     Last Pain:  Vitals:   10/11/17 1146  TempSrc:   PainSc: 9          Complications: No apparent anesthesia complications

## 2017-10-11 NOTE — ED Provider Notes (Signed)
[facility] Emergency Department Provider Note  ____________________________________________   First MD Initiated Contact with Patient 10/11/17 [phone]     (approximate)  I have reviewed the triage vital signs and the nursing notes.   HISTORY  Chief Complaint Vaginal Pain   HPI Saunders GlanceJessica D Villegas is a 27 y.o. female a recent ER visit for a was diagnosed as a urethral diverticulum who is presenting to the emergency department for worsening pain as well as bloody discharge from the vagina.  She says that her pain is a 10 out of 10 at this time.  She says that she was diagnosed with the urethral diverticulum days ago and has been taking her Bactrim but without relief.  Says that over the past 1-2 days she has had worsening of her pain and this morning had a gush of blood as well as what appeared like pus from the lesion.  She says that she thinks that the lesion has about tripled in size.  However, it size is reduced since the gush of blood and pus this morning.  Says that the pain is now radiating up to her abdomen.    Past Medical History:  Diagnosis Date  . Dysplasia of cervix 2013   dyplasia has resolved since birth of son in 2013.  [name]. Psoriasis of scalp 2004   mainly scalp, uses sun for treatment    Patient Active Problem List   Diagnosis Date Noted  . Epigastric pain 06/30/2015    Past Surgical History:  Procedure Laterality Date  . ABDOMINAL SURGERY    . APPENDECTOMY    . CESAREAN SECTION    . CESAREAN SECTION N/A 2013  . CYSTOSCOPY N/A 05/23/2015   Procedure: CYSTOSCOPY;  Surgeon: Nadara Mustardobert P Harris, MD;  Location: [facility] ORS;  Service: Gynecology;  Laterality: N/A;  . ESOPHAGOGASTRODUODENOSCOPY N/A 07/02/2015   Procedure: ESOPHAGOGASTRODUODENOSCOPY (EGD) looking in the esophagus stomach and upper small intestine with a lighted tube to evaluate and treat;  Surgeon: Christena DeemMartin U Skulskie, MD;  [facility];  Service: Endoscopy;  Laterality: N/A;  .  LAPAROSCOPY N/A 05/23/2015   Procedure: LAPAROSCOPY DIAGNOSTIC;  Surgeon: Nadara Mustardobert P Harris, MD;  Location: [facility] ORS;  Service: Gynecology;  Laterality: N/A;    Prior to Admission medications   Medication Sig Start Date End Date Taking? Authorizing Provider  acetaminophen (TYLENOL) 325 MG tablet Take 650 mg by mouth every 4 (four) hours as needed for mild pain, moderate pain, fever or headache.     [provider]  Calcium Carb-Cholecalciferol (CALCIUM 600-D PO) Take 1 tablet by mouth daily at 12 noon.    [provider]  cyclobenzaprine (FLEXERIL) 5 MG tablet Take 5 mg by mouth. 08/10/16   [provider]  diazepam (VALIUM) 2 MG tablet Take 1 tablet (2 mg total) by mouth every 8 (eight) hours as needed for muscle spasms. 03/23/16   Tommi RumpsSummers, Rhonda L, PA-C  docusate sodium (COLACE) 100 MG capsule Take 1 capsule (100 mg total) by mouth daily as needed. 10/07/17 10/07/18  Jeanmarie PlantMcShane, James A, MD  DULoxetine (CYMBALTA) 20 MG capsule Take 20 mg by mouth daily.    [provider]  ferrous sulfate 325 (65 FE) MG tablet Take 325 mg by mouth daily.    [provider]  gabapentin (NEURONTIN) 300 MG capsule TAKE 2 CAPSULES BY MOUTH TWICE DAILY 07/22/16   [provider]  ketorolac (TORADOL) 10 MG tablet Take 1 tablet (10 mg total) by mouth every 6 (six)  hours as needed. Patient not taking: Reported on 10/11/2017 06/02/17   Darci Current, MD  medroxyPROGESTERone (DEPO-PROVERA) 150 MG/ML injection Inject 1 mL into the muscle every 3 (three) months.    [provider]  Melatonin 5 MG TABS Take 5 mg by mouth at bedtime as needed (for sleep).    [provider]  ondansetron (ZOFRAN) 4 MG tablet Take 1 tablet (4 mg total) by mouth every 8 (eight) hours as needed for nausea or vomiting. 10/07/17   Jeanmarie Plant, MD  oxyCODONE-acetaminophen (ROXICET) 5-325 MG tablet Take 1 tablet by mouth every 6 (six) hours as needed. 10/07/17 10/07/18  Jeanmarie Plant, MD  sulfamethoxazole-trimethoprim (BACTRIM DS,SEPTRA DS) 800-160 MG tablet Take 1 tablet by mouth 2 (two) times daily. 10/07/17   Jeanmarie Plant, MD    Allergies Patient has no known allergies.  No family history on file.  Social History Social History   Tobacco Use  . Smoking status: Former Smoker    Packs/day: 0.50    Types: Cigarettes  . Smokeless tobacco: Never Used  Substance Use Topics  . Alcohol use: No    Alcohol/week: 0.0 oz  . Drug use: Yes    Types: Marijuana    Comment: Once every 3 months    Review of Systems  Constitutional: No fever/chills Eyes: No visual changes. ENT: No sore throat. Cardiovascular: Denies chest pain. Respiratory: Denies shortness of breath. Gastrointestinal:  No nausea, no vomiting.  No diarrhea.  No constipation. Genitourinary: Negative for dysuria. Musculoskeletal: Negative for back pain. Skin: Negative for rash. Neurological: Negative for headaches, focal weakness or numbness.   ____________________________________________   PHYSICAL EXAM:  VITAL SIGNS: ED Triage Vitals  Enc Vitals Group     BP 10/11/17 0804 137/89     Pulse Rate 10/11/17 0804 98     Resp 10/11/17 0804 18     Temp 10/11/17 0804 98.2 F (36.8 C)     Temp Source 10/11/17 0804 Oral     SpO2 10/11/17 0804 99 %     Weight 10/11/17 0808 150 lb (68 kg)     Height 10/11/17 0808 5\' 3"  (1.6 m)     Head Circumference --      Peak Flow --      Pain Score 10/11/17 0808 10     Pain Loc --      Pain Edu? --      Excl. in GC? --     Constitutional: Alert and oriented. Well appearing and in no acute distress. Eyes: Conjunctivae are normal.  Head: Atraumatic. Nose: No congestion/rhinnorhea. Mouth/Throat: Mucous membranes are moist.  Neck: No stridor.   Cardiovascular: Normal rate, regular rhythm. Grossly normal heart sounds.  Respiratory: Normal respiratory effort.  No retractions. Lungs CTAB. Gastrointestinal: Soft with mild left lower quadrant abdominal  tenderness to palpation. No distention. No CVA tenderness. Genitourinary: Examination done with nurse, Morrie Sheldon, at the bedside.  External exam with what appears to be friable tissue surrounding the urethral meatus is without any drainage.  There is also some swelling around the urethral meatus without induration.  Unable to pass the speculum because of pain and obstruction of the vaginal canal by the swollen mass around the meatus.  Digital exam done with a single finger and I am able to palpate a spherical structure to the ventral vagina that is very tender, nonfluctuant.  I am able to get a finger past the mass and beyond it it feels like a normal vaginal  canal without any other masses or swelling.  Because of the pain I am unable to pass all the way to the cervix to do a cervical exam.  I was unable to express any pus.  However, the mass is very tender.  It feels about 4-6 cm in diameter. Musculoskeletal: No lower extremity tenderness nor edema.  No joint effusions. Neurologic:  Normal speech and language. No gross focal neurologic deficits are appreciated. Skin:  Skin is warm, dry and intact. No rash noted. Psychiatric: Mood and affect are normal. Speech and behavior are normal.  ____________________________________________   LABS (all labs ordered are listed, but only abnormal results are displayed)  Labs Reviewed  CBC WITH DIFFERENTIAL/PLATELET - Abnormal; Notable for the following components:      Result Value   WBC 12.3 (*)    Neutro Abs 10.0 (*)    All other components within normal limits  COMPREHENSIVE METABOLIC PANEL - Abnormal; Notable for the following components:   ALT 10 (*)    Total Bilirubin 0.2 (*)    All other components within normal limits  LIPASE, BLOOD  URINALYSIS, COMPLETE (UACMP) WITH MICROSCOPIC   ____________________________________________  EKG   ____________________________________________  RADIOLOGY  MRI with improving cystic lesion of the urethral  meatus now measuring 8 x 11 x 4 mm.  Favoring resolving Skene's gland cysts or abscess. ____________________________________________   PROCEDURES  Procedure(s) performed:   Procedures  Critical Care performed:   ____________________________________________   INITIAL IMPRESSION / ASSESSMENT AND PLAN / ED COURSE  Pertinent labs & imaging results that were available during my care of the patient were reviewed by me and considered in my medical decision making (see chart for details).  DDX: Urethral cysts, vaginal cyst, vaginal abscess, vaginal bleeding, vaginal infection As part of my medical decision making, I reviewed the following data within the electronic MEDICAL RECORD NUMBER Notes from prior ED visits  I reviewed the patient's past imaging which gave a differential of multiple possible etiologies.  The past ER note lists a discussion with urology which concluded that this is likely related to a urethral diverticulum.  However, after the patient saying that she passed blood and pus this morning I am more suspicious of an abscess.  ----------------------------------------- 9:58 AM on 10/11/2017 -----------------------------------------  I discussed the case with Dr. Lonna Cobb of urology.  In particular, I was curious if an incision and drainage will be necessary at this point.  He is told that he will review the imaging and call back. Clinical Course as of Oct 11 1541  Tue Oct 11, 2017  1047 Dr. Lonna Cobb is recommending an MRI of the pelvis.  [DS]    Clinical Course User Index [DS] Myrna Blazer, MD   ----------------------------------------- 3:44 PM on 10/11/2017 -----------------------------------------  I discussed the case with Dr. Lonna Cobb of urology who recommends an OB/GYN consultation.  Patient says that she does not have an OB/GYN that she sees regularly.  Discussed case with Dr. Bjorn Pippin of OB/GYN who says that she will be down to examine the patient.  Signed  out to Dr. Fanny Bien.  Still pending a BUN consult at this time.  Patient did required an additional dose of pain medication because of return of pain.  ____________________________________________   FINAL CLINICAL IMPRESSION(S) / ED DIAGNOSES  Skene's gland abscess.    NEW MEDICATIONS STARTED DURING THIS VISIT:  New Prescriptions   No medications on file     Note:  This document was prepared using Dragon voice recognition  software and may include unintentional dictation errors.     Myrna Blazer, MD 10/11/17 [phone]

## 2017-10-11 NOTE — Op Note (Signed)
Operative Note  10/11/2017  PRE-OP DIAGNOSIS: Skene's gland abscess  POST-OP DIAGNOSIS: Skene's gland abscess   SURGEON: Adelene Idlerhristanna Schuman, MD  PROCEDURE: Procedure(s): INCISION AND DRAINAGE SKENE CYST   ANESTHESIA: Choice   ESTIMATED BLOOD LOSS: less than 10 cc  SPECIMENS: None  COMPLICATIONS: none  DISPOSITION: PACU - hemodynamically stable.  CONDITION: stable  FINDINGS: Exam under anesthesia revealed small, mobile 8cm uterus with no masses and bilateral adnexa without masses or fullness.   PROCEDURE IN DETAIL: After informed consent was obtained, the patient was taken to the operating room where anesthesia was obtained without difficulty. The patient was positioned in the dorsal lithotomy position in [place] stirrups. The patient's bladder was catheterized with an in and out foley catheter. The patient was examined under anesthesia, with the above noted findings. The weighted speculum was placed inside the patient's vagina, a right angle retractor was used to visualize the patient's cervix which was normal. The anterior vaginal wall just below the urethra was grasped with an Allis. The 15 blade scalpel was used to create a small incision. The abscess was entered, sabs for culture were collected. There was a minimal amount of drainage of purulent material or fluid from the cyst. It appeared that the cyst had likely already drained in the gush described by the patient. The tissue was indurated.  The space was packed with sterile gauze. A foley catheter was inserted into the urethra.   Excellent hemostasis was noted, and all instruments were removed, with excellent hemostasis noted throughout. She was then taken out of dorsal lithotomy. The patient tolerated the procedure well. Sponge, lap and needle counts were correct x2. The patient was taken to recovery room in excellent condition.  Adelene Idlerhristanna Schuman, MD [facility], [facility] 10/11/2017  5:20 PM

## 2017-10-11 NOTE — ED Notes (Signed)
Rn notified lab of the UA Preg test needed. Lab will result.

## 2017-10-11 NOTE — ED Notes (Signed)
OB at bedside.  Lidocaine given to MD for procedure.

## 2017-10-11 NOTE — ED Notes (Signed)
Pt transported to OR at this time. Consent obtained by MD, sent with pt.

## 2017-10-11 NOTE — ED Notes (Signed)
Patient transported to MRI 

## 2017-10-11 NOTE — Anesthesia Postprocedure Evaluation (Signed)
Anesthesia Post Note  Patient: Mandy Hansen  Procedure(s) Performed: INCISION AND DRAINAGE SKENE CYST (N/A )  Patient location during evaluation: PACU Anesthesia Type: General Level of consciousness: awake and alert Pain management: pain level controlled Vital Signs Assessment: post-procedure vital signs reviewed and stable Respiratory status: spontaneous breathing and respiratory function stable Cardiovascular status: stable Anesthetic complications: no     Last Vitals:  Vitals:   10/11/17 1707 10/11/17 1718  BP: (!) 116/56   Pulse: 95 86  Resp: 12 (!) 21  Temp: 37.2 C   SpO2: 100% 100%    Last Pain:  Vitals:   10/11/17 1718  TempSrc:   PainSc: 10-Worst pain ever                 Quante Pettry K

## 2017-10-11 NOTE — ED Notes (Signed)
Pelvic cart set up at bedside EDP aware.

## 2017-10-11 NOTE — Anesthesia Preprocedure Evaluation (Signed)
Anesthesia Evaluation  Patient identified by MRN, date of birth, ID band Patient awake    Reviewed: Allergy & Precautions, NPO status , Patient's Chart, lab work & pertinent test results  History of Anesthesia Complications Negative for: history of anesthetic complications  Airway Mallampati: II       Dental   Pulmonary neg sleep apnea, neg COPD, Current Smoker,           Cardiovascular (-) hypertension(-) Past MI and (-) CHF (-) dysrhythmias (-) Valvular Problems/Murmurs     Neuro/Psych    GI/Hepatic Neg liver ROS, GERD  Medicated,  Endo/Other  neg diabetes  Renal/GU negative Renal ROS     Musculoskeletal   Abdominal   Peds  Hematology   Anesthesia Other Findings   Reproductive/Obstetrics                             Anesthesia Physical Anesthesia Plan  ASA: II and emergent  Anesthesia Plan: General   Post-op Pain Management:    Induction: Intravenous  PONV Risk Score and Plan: 3 and Dexamethasone, Ondansetron and Midazolam  Airway Management Planned: LMA  Additional Equipment:   Intra-op Plan:   Post-operative Plan:   Informed Consent: I have reviewed the patients History and Physical, chart, labs and discussed the procedure including the risks, benefits and alternatives for the proposed anesthesia with the patient or authorized representative who has indicated his/her understanding and acceptance.     Plan Discussed with:   Anesthesia Plan Comments:         Anesthesia Quick Evaluation

## 2017-10-11 NOTE — Anesthesia Post-op Follow-up Note (Signed)
Anesthesia QCDR form completed.        

## 2017-10-11 NOTE — Anesthesia Procedure Notes (Signed)
Procedure Name: LMA Insertion Date/Time: 10/11/2017 4:23 PM Performed by: Ginger CarneMichelet, Ezequias Lard, CRNA Pre-anesthesia Checklist: Patient identified, Emergency Drugs available, Suction available, Patient being monitored and Timeout performed Patient Re-evaluated:Patient Re-evaluated prior to induction Oxygen Delivery Method: Circle system utilized Preoxygenation: Pre-oxygenation with 100% oxygen Induction Type: IV induction LMA: LMA inserted LMA Size: 3.5 Tube type: Oral Number of attempts: 1 Placement Confirmation: positive ETCO2 and breath sounds checked- equal and bilateral Tube secured with: Tape Dental Injury: Teeth and Oropharynx as per pre-operative assessment

## 2017-10-11 NOTE — ED Triage Notes (Signed)
Pt reports that she has been having vaginal pain for about 3 weeks with swelling to area. Pt reports excessive discharge. States that now there is bulging of vaginal area.

## 2017-10-12 ENCOUNTER — Encounter: Payer: Self-pay | Admitting: Obstetrics and Gynecology

## 2017-10-12 ENCOUNTER — Other Ambulatory Visit: Payer: Self-pay | Admitting: Obstetrics and Gynecology

## 2017-10-12 LAB — CBC
HCT: 40.5 % (ref 35.0–47.0)
HEMOGLOBIN: 13.4 g/dL (ref 12.0–16.0)
MCH: 31.2 pg (ref 26.0–34.0)
MCHC: 33 g/dL (ref 32.0–36.0)
MCV: 94.6 fL (ref 80.0–100.0)
Platelets: 236 10*3/uL (ref 150–440)
RBC: 4.28 MIL/uL (ref 3.80–5.20)
RDW: 13.2 % (ref 11.5–14.5)
WBC: 9.9 10*3/uL (ref 3.6–11.0)

## 2017-10-12 MED ORDER — OXYCODONE HCL 5 MG PO TABS: 5.0000 mg | ORAL_TABLET | Freq: Four times a day (QID) | ORAL | 0 refills | Status: DC | PRN

## 2017-10-12 MED ORDER — ALUM & MAG HYDROXIDE-SIMETH 200-200-20 MG/5ML PO SUSP
30.0000 mL | Freq: Every day | ORAL | Status: DC | PRN
  Administered 2017-10-12: 30 mL via ORAL
  Filled 2017-10-12: qty 30

## 2017-10-12 MED ORDER — AMOXICILLIN-POT CLAVULANATE 125-31.25 MG/5ML PO SUSR: 125.0000 mg | Freq: Two times a day (BID) | ORAL | 0 refills | Status: DC

## 2017-10-12 MED ORDER — DOXYCYCLINE HYCLATE 100 MG IV SOLR
100.0000 mg | Freq: Two times a day (BID) | INTRAVENOUS | Status: DC
  Administered 2017-10-12: 100 mg via INTRAVENOUS
  Filled 2017-10-12 (×2): qty 100

## 2017-10-12 MED ORDER — DOXYCYCLINE HYCLATE 100 MG PO CAPS: 100.0000 mg | ORAL_CAPSULE | Freq: Two times a day (BID) | ORAL | 0 refills | Status: DC

## 2017-10-12 MED ORDER — IBUPROFEN 800 MG PO TABS: 800.0000 mg | ORAL_TABLET | Freq: Three times a day (TID) | ORAL | 0 refills | Status: DC | PRN

## 2017-10-12 NOTE — Progress Notes (Signed)
Patient assisted with sitz bath. Tolerated well.

## 2017-10-12 NOTE — Discharge Instructions (Signed)
Skin Abscess A skin abscess is an infected area on or under your skin that contains pus and other material. An abscess can happen almost anywhere on your body. Some abscesses break open (rupture) on their own. Most continue to get worse unless they are treated. The infection can spread deeper into the body and into your blood, which can make you feel sick. Treatment usually involves draining the abscess. Follow these instructions at home: Abscess Care  If you have an abscess that has not drained, place a warm, clean, wet washcloth over the abscess several times a day. Do this as told by your doctor.  Follow instructions from your doctor about how to take care of your abscess. Make sure you: ? Cover the abscess with a bandage (dressing). ? Change your bandage or gauze as told by your doctor. ? Wash your hands with soap and water before you change the bandage or gauze. If you cannot use soap and water, use hand sanitizer.  Check your abscess every day for signs that the infection is getting worse. Check for: ? More redness, swelling, or pain. ? More fluid or blood. ? Warmth. ? More pus or a bad smell. Medicines   Take over-the-counter and prescription medicines only as told by your doctor.  If you were prescribed an antibiotic medicine, take it as told by your doctor. Do not stop taking the antibiotic even if you start to feel better. General instructions  To avoid spreading the infection: ? Do not share personal care items, towels, or hot tubs with others. ? Avoid making skin-to-skin contact with other people.  Keep all follow-up visits as told by your doctor. This is important. Contact a doctor if:  You have more redness, swelling, or pain around your abscess.  You have more fluid or blood coming from your abscess.  Your abscess feels warm when you touch it.  You have more pus or a bad smell coming from your abscess.  You have a fever.  Your muscles ache.  You have  chills.  You feel sick. Get help right away if:  You have very bad (severe) pain.  You see red streaks on your skin spreading away from the abscess. This information is not intended to replace advice given to you by your health care provider. Make sure you discuss any questions you have with your health care provider. Document Released: 03/01/2008 Document Revised: 05/09/2016 Document Reviewed: 07/23/2015 Elsevier Interactive Patient Education  2018 [employer]  

## 2017-10-12 NOTE — Progress Notes (Signed)
Discharge to home...pt requesting to leave d/t child getting off the bus.  Discharge inst reviewed with pt and knows to continue to do sitz bath. Discharge to home with husband, escorted to car via auxillary.

## 2017-10-12 NOTE — Discharge Summary (Signed)
Gynecology Physician Postoperative Discharge Summary  Patient ID: Mandy Hansen MRN: [id number] DOB/AGE: 1990/11/28 27 y.o.  Admit Date: 10/11/2017 Discharge Date: 10/12/2017  [facility] Diagnoses[facility]  Procedures: Procedure(s) (LRB): INCISION AND DRAINAGE SKENE CYST (N/A)  Significant Labs: CBC Latest Ref Rng & Units 10/12/2017 10/11/2017 10/07/2017  WBC 3.6 - 11.0 K/uL 9.9 12.3(H) 11.7(H)  Hemoglobin 12.0 - 16.0 g/dL 13.213.4 44.013.1 10.214.5  Hematocrit 35.0 - 47.0 % 40.5 40.2 44.4  Platelets 150 - 440 K/uL 236 247 293    Hospital Course:  Mandy Hansen is a 27 y.o. G1P1001  admitted for scheduled surgery due to severe pain from vaginal abscess.  She underwent the procedures as mentioned above, her operation was uncomplicated. For further details about surgery, please refer to the operative report. Patient had an uncomplicated postoperative course. By time of discharge on POD#1, her pain was controlled on oral pain medications; she was ambulating, voiding without difficulty, tolerating regular diet and passing flatus. She was deemed stable for discharge to home.   Discharge Exam: Blood pressure (!) 143/89, pulse (!) 112, temperature 97.9 F (36.6 C), temperature source Oral, resp. rate 20, height 5\' 3"  (1.6 m), weight 150 lb (68 kg), last menstrual period 09/29/2017, SpO2 100 %. General appearance: alert and no distress  Resp: clear to auscultation bilaterally  Cardio: regular rate and rhythm  GI: soft, non-tender; bowel sounds normal; no masses, no organomegaly.  Incision: C/D/I, no erythema, no drainage noted Pelvic: scant blood on pad  Extremities: extremities normal, atraumatic, no cyanosis or edema and Homans sign is negative, no sign of DVT  Discharged Condition: Stable  Disposition: 01-Home or Self Care  Discharge Instructions    Call MD for:  difficulty breathing, headache or visual disturbances   Complete by:  As directed    Call MD for:  extreme  fatigue   Complete by:  As directed    Call MD for:  hives   Complete by:  As directed    Call MD for:  persistant dizziness or light-headedness   Complete by:  As directed    Call MD for:  persistant nausea and vomiting   Complete by:  As directed    Call MD for:  redness, tenderness, or signs of infection (pain, swelling, redness, odor or green/yellow discharge around incision site)   Complete by:  As directed    Call MD for:  severe uncontrolled pain   Complete by:  As directed    Call MD for:  temperature >100.4   Complete by:  As directed    Diet general   Complete by:  As directed    Discharge wound care:   Complete by:  As directed    Sitz baths 4 times a day at home. Use peribotttle for urination   Driving Restrictions   Complete by:  As directed    Do not drive while taking narcotic medications   Increase activity slowly   Complete by:  As directed    May shower / Bathe   Complete by:  As directed      Allergies as of 10/12/2017   No Known Allergies     Medication List    STOP taking these medications   acetaminophen 325 MG tablet Commonly known as:  TYLENOL   cyclobenzaprine 5 MG tablet Commonly known as:  FLEXERIL   diazepam 2 MG tablet Commonly known as:  VALIUM   ketorolac 10 MG tablet Commonly known as:  TORADOL  medroxyPROGESTERone 150 MG/ML injection Commonly known as:  DEPO-PROVERA   oxyCODONE-acetaminophen 5-325 MG tablet Commonly known as:  ROXICET   sulfamethoxazole-trimethoprim 800-160 MG tablet Commonly known as:  BACTRIM DS,SEPTRA DS     TAKE these medications   amoxicillin-clavulanate 125-31.25 MG/5ML suspension Commonly known as:  AUGMENTIN Take 5 mLs (125 mg total) by mouth 2 (two) times daily for 10 days.   CALCIUM 600-D PO Take 1 tablet by mouth daily at 12 noon.   docusate sodium 100 MG capsule Commonly known as:  COLACE Take 1 capsule (100 mg total) by mouth daily as needed.   doxycycline 100 MG capsule Commonly known  as:  VIBRAMYCIN Take 1 capsule (100 mg total) by mouth 2 (two) times daily for 10 days.   DULoxetine 20 MG capsule Commonly known as:  CYMBALTA Take 20 mg by mouth daily.   ferrous sulfate 325 (65 FE) MG tablet Take 325 mg by mouth daily.   gabapentin 300 MG capsule Commonly known as:  NEURONTIN TAKE 2 CAPSULES BY MOUTH TWICE DAILY   ibuprofen 800 MG tablet Commonly known as:  ADVIL,MOTRIN Take 1 tablet (800 mg total) by mouth every 8 (eight) hours as needed.   Melatonin 5 MG Tabs Take 5 mg by mouth at bedtime as needed (for sleep).   ondansetron 4 MG tablet Commonly known as:  ZOFRAN Take 1 tablet (4 mg total) by mouth every 8 (eight) hours as needed for nausea or vomiting.   oxyCODONE 5 MG immediate release tablet Commonly known as:  Oxy IR/ROXICODONE Take 1-2 tablets (5-10 mg total) by mouth every 6 (six) hours as needed for severe pain.            Discharge Care Instructions  (From admission, onward)        Start     Ordered   10/12/17 0000  Discharge wound care:    Comments:  Sitz baths 4 times a day at home. Use peribotttle for urination   10/12/17 1001     Follow-up Information    Natale Milch, MD. Go on 10/13/2017.   Specialty:  Obstetrics and Gynecology Why:  Thursday (10/13/17) at 11:30  And,Friday at (10/14/17) 1:50pm     w Dr Lalla Brothers information: [address] [place] [address] [phone]           Annamarie Major, MD

## 2017-10-12 NOTE — Progress Notes (Signed)
Subjective: Patient reports tolerating PO.  Foley removed. Attempted repacking but pain was too significant and induration is much improved.   Objective: I have reviewed patient's vital signs, medications and microbiology.  General: alert, cooperative and mild distress Resp: clear to auscultation bilaterally Cardio: regular rate and rhythm, S1, S2 normal, no murmur, click, rub or gallop GI: soft, non-tender; bowel sounds normal; no masses,  no organomegaly Extremities: extremities normal, atraumatic, no cyanosis or edema Vaginal Bleeding: none No drainage or bleeding from incision site just below urethra.   MICRO: Gram variable rods and gram positive cocci on initial mirco report.  Assessment/Plan: 26yo G1P1001 with a Skene's gland abscess, status post incision and drainage.  1. Attempted to repack, but patient is very uncomfortable and minimal packing placed into incision so all packing removed and discontinued packing.  2. Continue sitz baths 4 times a day. 3. Foley removed, urination with peribottle advised.   4. Continue IV antibiotics, plan for discharge with po antibiotics, called pharmacy for recommendations.  5. Plan for discharge home this evening.    LOS: 0 days    Mandy Hansen 10/12/2017, 8:50 AM

## 2017-10-13 ENCOUNTER — Ambulatory Visit: Payer: Self-pay | Admitting: Urology

## 2017-10-13 ENCOUNTER — Encounter: Payer: Self-pay | Admitting: Urology

## 2017-10-13 ENCOUNTER — Ambulatory Visit (INDEPENDENT_AMBULATORY_CARE_PROVIDER_SITE_OTHER): Payer: Self-pay | Admitting: Obstetrics and Gynecology

## 2017-10-13 ENCOUNTER — Encounter: Payer: Self-pay | Admitting: Obstetrics and Gynecology

## 2017-10-13 VITALS — BP 106/68 | HR 82 | Wt 145.0 lb

## 2017-10-13 DIAGNOSIS — N368 Other specified disorders of urethra: Secondary | ICD-10-CM

## 2017-10-14 ENCOUNTER — Encounter: Payer: Self-pay | Admitting: Obstetrics & Gynecology

## 2017-10-14 ENCOUNTER — Ambulatory Visit (INDEPENDENT_AMBULATORY_CARE_PROVIDER_SITE_OTHER): Payer: Self-pay | Admitting: Obstetrics & Gynecology

## 2017-10-14 VITALS — BP 100/60 | HR 96 | Ht 63.0 in | Wt 142.0 lb

## 2017-10-14 DIAGNOSIS — N368 Other specified disorders of urethra: Secondary | ICD-10-CM

## 2017-10-14 NOTE — Progress Notes (Signed)
  History of Present Illness:  Mandy Hansen is a 27 y.o. that had a procedure PMHx: She  has a past Saunders Glancemedical history of Dysplasia of cervix (2013) and Psoriasis of scalp (2004). Also,  has a past surgical history that includes Cesarean section; Cesarean section (N/A, 2013); laparoscopy (N/A, 05/23/2015); Cystoscopy (N/A, 05/23/2015); Esophagogastroduodenoscopy (N/A, 07/02/2015); Abdominal surgery; Appendectomy; and Incision and drainage abscess (N/A, 10/11/2017)., family history is not on file.,  reports that she has quit smoking. Her smoking use included cigarettes. She smoked 0.50 packs per day. she has never used smokeless tobacco. She reports that she uses drugs. Drug: Marijuana. She reports that she does not drink alcohol.  Current Outpatient Medications:  .  amoxicillin-clavulanate (AUGMENTIN) 125-31.25 MG/5ML suspension, Take 5 mLs (125 mg total) by mouth 2 (two) times daily for 10 days., Disp: 100 mL, Rfl: 0 .  Calcium Carb-Cholecalciferol (CALCIUM 600-D PO), Take 1 tablet by mouth daily at 12 noon., Disp: , Rfl:  .  docusate sodium (COLACE) 100 MG capsule, Take 1 capsule (100 mg total) by mouth daily as needed., Disp: 30 capsule, Rfl: 2 .  doxycycline (VIBRAMYCIN) 100 MG capsule, Take 1 capsule (100 mg total) by mouth 2 (two) times daily for 10 days., Disp: 14 capsule, Rfl: 0 .  DULoxetine (CYMBALTA) 20 MG capsule, Take 20 mg by mouth daily., Disp: , Rfl:  .  ferrous sulfate 325 (65 FE) MG tablet, Take 325 mg by mouth daily., Disp: , Rfl:  .  gabapentin (NEURONTIN) 300 MG capsule, TAKE 2 CAPSULES BY MOUTH TWICE DAILY, Disp: , Rfl:  .  ibuprofen (ADVIL,MOTRIN) 800 MG tablet, Take 1 tablet (800 mg total) by mouth every 8 (eight) hours as needed. (Patient not taking: Reported on 10/13/2017), Disp: 30 tablet, Rfl: 0 .  Melatonin 5 MG TABS, Take 5 mg by mouth at bedtime as needed (for sleep)., Disp: , Rfl:  .  ondansetron (ZOFRAN) 4 MG tablet, Take 1 tablet (4 mg total) by mouth every 8 (eight) hours  as needed for nausea or vomiting., Disp: 8 tablet, Rfl: 0 .  oxyCODONE (OXY IR/ROXICODONE) 5 MG immediate release tablet, Take 1-2 tablets (5-10 mg total) by mouth every 6 (six) hours as needed for severe pain., Disp: 30 tablet, Rfl: 0   Also, has No Known Allergies..  Review of Systems  All other systems reviewed and are negative.  Physical Exam:  BP 100/60   Pulse 96   Ht 5\' 3"  (1.6 m)   Wt 142 lb (64.4 kg)   LMP 09/29/2017   BMI 25.15 kg/m  Body mass index is 25.15 kg/m. Constitutional: Well nourished, well developed female in no acute distress.  Abdomen: diffusely non tender to palpation, non distended, and no masses, hernias Neuro: Grossly intact Psych:  Normal mood and affect.    Pelvic exam:  No erythema or edema of vulava; Vagina normal other than palpable 1 cm swelling just superior to urethral meatus with small area of opening/drainage; no erythema here; mild T to touch  Assessment: Skenes Cyst/ Abscess, s/p I&D procedure 3 days ago  Plan: Cont ABX; Sitz baths; pelvic rest F/U 3 days  Annamarie MajorPaul Bali Lyn, MD, Merlinda FrederickFACOG [facility], [facility] 10/14/2017  2:18 PM

## 2017-10-14 NOTE — Progress Notes (Signed)
Gynecology Pelvic Pain Evaluation   Chief Complaint: No chief complaint on file.   History of Present Illness:   Patient is a 27 y.o. G1P1001 who was recently seen in the ER and taken to surgery for drainage of a skene's gland cyst. Patient was supposed to see urology today, but did not because she overslept. She said reported that she was very tired from the hospital and needed the rest. She his not having issues with drainage or leaking of urine. She is urinating normally with a peribottle. She has not had fevers at home. She feels much better. She is doing sitz baths 4 times a day at home. She is taking antibiotics. Pain is controlled.   PMHx: She  has a past medical history of Dysplasia of cervix (2013) and Psoriasis of scalp (2004). Also,  has a past surgical history that includes Cesarean section; Cesarean section (N/A, 2013); laparoscopy (N/A, 05/23/2015); Cystoscopy (N/A, 05/23/2015); Esophagogastroduodenoscopy (N/A, 07/02/2015); Abdominal surgery; Appendectomy; and Incision and drainage abscess (N/A, 10/11/2017)., family history is not on file.,  reports that she has quit smoking. Her smoking use included cigarettes. She smoked 0.50 packs per day. she has never used smokeless tobacco. She reports that she uses drugs. Drug: Marijuana. She reports that she does not drink alcohol.  She has a current medication list which includes the following prescription(s): amoxicillin-clavulanate, docusate sodium, doxycycline, ondansetron, oxycodone, calcium carb-cholecalciferol, duloxetine, ferrous sulfate, gabapentin, ibuprofen, and melatonin. Also, has No Known Allergies.  Review of Systems  Constitutional: Negative for chills, fever, malaise/fatigue and weight loss.  HENT: Negative for congestion, hearing loss and sinus pain.   Eyes: Negative for blurred vision and double vision.  Respiratory: Negative for cough, sputum production, shortness of breath and wheezing.   Cardiovascular: Negative for chest  pain, palpitations, orthopnea and leg swelling.  Gastrointestinal: Negative for abdominal pain, constipation, diarrhea, nausea and vomiting.  Genitourinary: Negative for dysuria, flank pain, frequency, hematuria and urgency.  Musculoskeletal: Negative for back pain, falls and joint pain.  Skin: Negative for itching and rash.  Neurological: Negative for dizziness and headaches.  Psychiatric/Behavioral: Negative for depression, substance abuse and suicidal ideas. The patient is not nervous/anxious.     Objective: BP 106/68 (BP Location: Left Arm, Patient Position: Sitting, Cuff Size: Normal)   Pulse 82   Wt 145 lb (65.8 kg)   LMP 09/29/2017   BMI 25.69 kg/m  Physical Exam  Constitutional: She is oriented to person, place, and time. She appears well-developed.  Genitourinary: There is no lesion on the right labia. There is no lesion on the left labia. Vagina exhibits no lesion. Right adnexum does not display mass. Left adnexum does not display mass. Cervix does not exhibit motion tenderness.  Genitourinary Comments: Small incision just below urethral meatus is healing well. No drainage. No bleeding.   HENT:  Head: Normocephalic and atraumatic.  Cardiovascular: Normal rate, regular rhythm and normal heart sounds.  Pulmonary/Chest: Effort normal and breath sounds normal. Right breast exhibits no inverted nipple, no mass, no nipple discharge and no skin change. Left breast exhibits no inverted nipple, no mass, no nipple discharge and no skin change.  Abdominal: Soft. Bowel sounds are normal. She exhibits no distension and no mass.  Neurological: She is alert and oriented to person, place, and time.  Skin: Skin is warm and dry.  Psychiatric: She has a normal mood and affect. Her behavior is normal. Judgment and thought content normal.  Vitals reviewed.   Female chaperone present for pelvic portion  of the physical exam  Assessment: 27 y.o. G1P1001 with Skene's gland cyst s/p incision and  drainage..  There are no diagnoses linked to this encounter. Problem List Items Addressed This Visit      Genitourinary   Skene's gland cyst - Primary    PLAN:  Will have patient continue antibiotics, continue sitz bath 4 times a day.  Return tomorrow and in 2 weeks for evaluation.   Adelene Idler MD [facility], [facility] 10/14/2017  7:17 PM

## 2017-10-17 ENCOUNTER — Ambulatory Visit (INDEPENDENT_AMBULATORY_CARE_PROVIDER_SITE_OTHER): Payer: Self-pay | Admitting: Obstetrics and Gynecology

## 2017-10-17 ENCOUNTER — Other Ambulatory Visit: Payer: Self-pay

## 2017-10-17 ENCOUNTER — Inpatient Hospital Stay
Admission: AD | Admit: 2017-10-17 | Discharge: 2017-10-19 | DRG: 700 | Disposition: A | Discharge status: Home / Self Care | Payer: Medicaid Other | Source: Ambulatory Visit | Attending: Obstetrics and Gynecology | Admitting: Obstetrics and Gynecology

## 2017-10-17 ENCOUNTER — Other Ambulatory Visit: Payer: Self-pay | Admitting: Obstetrics and Gynecology

## 2017-10-17 ENCOUNTER — Encounter: Payer: Self-pay | Admitting: Obstetrics and Gynecology

## 2017-10-17 VITALS — BP 106/68 | HR 139 | Temp 99.2°F | Wt 144.0 lb

## 2017-10-17 DIAGNOSIS — R102 Pelvic and perineal pain: Secondary | ICD-10-CM | POA: Diagnosis present

## 2017-10-17 DIAGNOSIS — Z87891 Personal history of nicotine dependence: Secondary | ICD-10-CM | POA: Diagnosis not present

## 2017-10-17 DIAGNOSIS — N368 Other specified disorders of urethra: Secondary | ICD-10-CM

## 2017-10-17 LAB — CBC
HCT: 42.4 % (ref 35.0–47.0)
Hemoglobin: 14.2 g/dL (ref 12.0–16.0)
MCH: 31.2 pg (ref 26.0–34.0)
MCHC: 33.4 g/dL (ref 32.0–36.0)
MCV: 93.5 fL (ref 80.0–100.0)
PLATELETS: 278 10*3/uL (ref 150–440)
RBC: 4.53 MIL/uL (ref 3.80–5.20)
RDW: 12.9 % (ref 11.5–14.5)
WBC: 9.4 10*3/uL (ref 3.6–11.0)

## 2017-10-17 LAB — COMPREHENSIVE METABOLIC PANEL
ALK PHOS: 67 U/L (ref 38–126)
ALT: 9 U/L — AB (ref 14–54)
AST: 27 U/L (ref 15–41)
Albumin: 3.8 g/dL (ref 3.5–5.0)
Anion gap: 10 (ref 5–15)
BUN: 12 mg/dL (ref 6–20)
CALCIUM: 9.1 mg/dL (ref 8.9–10.3)
CHLORIDE: 103 mmol/L (ref 101–111)
CO2: 23 mmol/L (ref 22–32)
CREATININE: 0.71 mg/dL (ref 0.44–1.00)
GFR calc Af Amer: >60 mL/min (ref >60)
GFR calc non Af Amer: >60 mL/min (ref >60)
GLUCOSE: 105 mg/dL — AB (ref 65–99)
Potassium: 4.1 mmol/L (ref 3.5–5.1)
SODIUM: 136 mmol/L (ref 135–145)
Total Bilirubin: 0.7 mg/dL (ref 0.3–1.2)
Total Protein: 6.8 g/dL (ref 6.5–8.1)

## 2017-10-17 MED ORDER — DEXTROSE 5 % IV SOLN
2.0000 g | Freq: Two times a day (BID) | INTRAVENOUS | Status: DC
  Administered 2017-10-17 – 2017-10-18 (×3): 2 g via INTRAVENOUS
  Filled 2017-10-17 (×4): qty 2

## 2017-10-17 MED ORDER — HYDROMORPHONE HCL 1 MG/ML IJ SOLN
1.0000 mg | INTRAMUSCULAR | Status: DC | PRN
  Administered 2017-10-18: 1 mg via INTRAVENOUS
  Filled 2017-10-17: qty 2

## 2017-10-17 MED ORDER — DULOXETINE HCL 20 MG PO CPEP
20.0000 mg | ORAL_CAPSULE | Freq: Every day | ORAL | Status: DC
  Administered 2017-10-17 – 2017-10-18 (×2): 20 mg via ORAL
  Filled 2017-10-17 (×3): qty 1

## 2017-10-17 MED ORDER — ONDANSETRON HCL 4 MG PO TABS
4.0000 mg | ORAL_TABLET | Freq: Four times a day (QID) | ORAL | Status: DC | PRN
  Administered 2017-10-19: 4 mg via ORAL
  Filled 2017-10-17: qty 1

## 2017-10-17 MED ORDER — DOCUSATE SODIUM 100 MG PO CAPS
100.0000 mg | ORAL_CAPSULE | Freq: Two times a day (BID) | ORAL | Status: DC
  Administered 2017-10-17 – 2017-10-18 (×4): 100 mg via ORAL
  Filled 2017-10-17 (×4): qty 1

## 2017-10-17 MED ORDER — IBUPROFEN 600 MG PO TABS
600.0000 mg | ORAL_TABLET | Freq: Four times a day (QID) | ORAL | Status: DC
  Administered 2017-10-17 – 2017-10-18 (×5): 600 mg via ORAL
  Filled 2017-10-17 (×6): qty 1

## 2017-10-17 MED ORDER — LACTATED RINGERS IV SOLN
INTRAVENOUS | Status: DC
  Administered 2017-10-17 – 2017-10-18 (×3): via INTRAVENOUS

## 2017-10-17 MED ORDER — METRONIDAZOLE IN NACL 5-0.79 MG/ML-% IV SOLN
500.0000 mg | Freq: Two times a day (BID) | INTRAVENOUS | Status: DC
  Administered 2017-10-17 – 2017-10-18 (×3): 500 mg via INTRAVENOUS
  Filled 2017-10-17 (×5): qty 100

## 2017-10-17 MED ORDER — HYDROCODONE-ACETAMINOPHEN 5-325 MG PO TABS
1.0000 | ORAL_TABLET | ORAL | Status: DC | PRN
  Administered 2017-10-17 – 2017-10-18 (×7): 2 via ORAL
  Filled 2017-10-17 (×7): qty 2

## 2017-10-17 MED ORDER — ONDANSETRON HCL 4 MG/2ML IJ SOLN
4.0000 mg | Freq: Four times a day (QID) | INTRAMUSCULAR | Status: DC | PRN
  Administered 2017-10-17 – 2017-10-19 (×3): 4 mg via INTRAVENOUS
  Filled 2017-10-17 (×3): qty 2

## 2017-10-17 NOTE — H&P (Signed)
History and Physical  [name] is an 27 y.o. female.  HPI: Last Tuesday patient was taken for an I&D of a skene's gland abscess. The patient was initially improved postoperatively, but over the weekend her pain has worsened and symptoms have returned. She is complaining of vague, deep pelvic pain. She reports chills at home but has not taken her temperature. She has been doing sitz baths and has been taking her antibiotics of Augmentin and doxycycline. She has used all of her pain medication. She is able to urinate, but has some hesitation because it is painful. She is using a peribottle with urination at home.  Past Medical History:  Diagnosis Date  . Dysplasia of cervix 2013   dyplasia has resolved since birth of son in 2013.  Mandy Hansen. Psoriasis of scalp 2004   mainly scalp, uses sun for treatment    Past Surgical History:  Procedure Laterality Date  . ABDOMINAL SURGERY    . APPENDECTOMY    . CESAREAN SECTION    . CESAREAN SECTION N/A 2013  . CYSTOSCOPY N/A 05/23/2015   Procedure: CYSTOSCOPY;  Surgeon: Mandy Mustardobert P Harris, MD;  Location: [facility] ORS;  Service: Gynecology;  Laterality: N/A;  . ESOPHAGOGASTRODUODENOSCOPY N/A 07/02/2015   Procedure: ESOPHAGOGASTRODUODENOSCOPY (EGD) looking in the esophagus stomach and upper small intestine with a lighted tube to evaluate and treat;  Surgeon: Mandy DeemMartin U Skulskie, MD;  [facility];  Service: Endoscopy;  Laterality: N/A;  . INCISION AND DRAINAGE ABSCESS N/A 10/11/2017   Procedure: INCISION AND DRAINAGE SKENE CYST;  Surgeon: Natale Hansen, Mandy Louison R, MD;  Location: [facility] ORS;  Service: Gynecology;  Laterality: N/A;  . LAPAROSCOPY N/A 05/23/2015   Procedure: LAPAROSCOPY DIAGNOSTIC;  Surgeon: Mandy Mustardobert P Harris, MD;  Location: [facility] ORS;  Service: Gynecology;  Laterality: N/A;    No family history on file.  Social History:  reports that she has quit smoking. Her smoking use included cigarettes. She smoked 0.50 packs per day. she has never used  smokeless tobacco. She reports that she uses drugs. Drug: Marijuana. She reports that she does not drink alcohol.  Allergies: No Known Allergies  Medications: I have reviewed the patient's current medications.  No results found for this or any previous visit (from the past 48 hour(s)).  No results found.  Review of Systems  Constitutional: Positive for chills. Negative for fever, malaise/fatigue and weight loss.  HENT: Negative for congestion, hearing loss and sinus pain.   Eyes: Negative for blurred vision and double vision.  Respiratory: Negative for cough, sputum production, shortness of breath and wheezing.   Cardiovascular: Negative for chest pain, palpitations, orthopnea and leg swelling.  Gastrointestinal: Positive for abdominal pain. Negative for constipation, diarrhea, nausea and vomiting.  Genitourinary: Positive for dysuria. Negative for flank pain, frequency, hematuria and urgency.  Musculoskeletal: Negative for back pain, falls and joint pain.  Skin: Negative for itching and rash.  Neurological: Negative for dizziness and headaches.  Psychiatric/Behavioral: Negative for depression, substance abuse and suicidal ideas. The patient is not nervous/anxious.    Blood pressure 127/86, pulse 84, temperature 98.7 F (37.1 C), temperature source Oral, resp. rate 18, last menstrual period 09/29/2017, SpO2 98 %. Physical Exam  Nursing note and vitals reviewed. Constitutional: She is oriented to person, place, and time. She appears well-developed and well-nourished.  HENT:  Head: Normocephalic and atraumatic.  Cardiovascular: Normal rate and regular rhythm.  Respiratory: Effort normal and breath sounds normal.  GI: Soft. Bowel sounds are normal.  Genitourinary:  Genitourinary Comments: Persistent  induration of anterior vaginal wall. Small purulent material at the location of the incision.  Induration is less than before. Approx size of a small grape. No drainage visible. No  bleeding.  Musculoskeletal: Normal range of motion.  Neurological: She is alert and oriented to person, place, and time.  Skin: Skin is warm and dry.  Psychiatric: She has a normal mood and affect. Her behavior is normal. Judgment and thought content normal.    Assessment/Plan: Will admit patient for IV antibiotic therapy because she has failed outpatient management. Sitz baths 4 times a day. IV and PO medications as needed for pain. If no improvement will consult urology for any insights they can offer and re-image the patient.   Mandy Hansen R Mandy Hansen 10/17/2017, 12:29 PM

## 2017-10-17 NOTE — Progress Notes (Signed)
  HPI:      [name] is a 27 y.o. G1P1001 who LMP was Patient's last menstrual period was 09/29/2017., presents today for a problem visit.  She complains of worsened pain  From the skene's gland cyst and left sided deep pelvic pain. She has had chills at home. She has been taking her antibiotics.  PMHx: She  has a past medical history of Dysplasia of cervix (2013) and Psoriasis of scalp (2004). Also,  has a past surgical history that includes Cesarean section; Cesarean section (N/A, 2013); laparoscopy (N/A, 05/23/2015); Cystoscopy (N/A, 05/23/2015); Esophagogastroduodenoscopy (N/A, 07/02/2015); Abdominal surgery; Appendectomy; and Incision and drainage abscess (N/A, 10/11/2017)., family history is not on file.,  reports that she has quit smoking. Her smoking use included cigarettes. She smoked 0.50 packs per day. she has never used smokeless tobacco. She reports that she uses drugs. Drug: Marijuana. She reports that she does not drink alcohol.  She has a current medication list which includes the following prescription(s): amoxicillin-clavulanate, calcium carb-cholecalciferol, docusate sodium, doxycycline, duloxetine, ferrous sulfate, gabapentin, ibuprofen, melatonin, ondansetron, and oxycodone, and the following Facility-Administered Medications: docusate sodium, duloxetine, hydrocodone-acetaminophen, hydromorphone, ibuprofen, lactated ringers, metronidazole, and ondansetron **OR** ondansetron. Also, has No Known Allergies.  Review of Systems  Constitutional: Positive for chills. Negative for fever, malaise/fatigue and weight loss.  HENT: Negative for congestion, hearing loss and sinus pain.   Eyes: Negative for blurred vision and double vision.  Respiratory: Negative for cough, sputum production, shortness of breath and wheezing.   Cardiovascular: Negative for chest pain, palpitations, orthopnea and leg swelling.  Gastrointestinal: Positive for abdominal pain. Negative for constipation,  diarrhea, nausea and vomiting.  Genitourinary: Positive for dysuria. Negative for flank pain, frequency, hematuria and urgency.  Musculoskeletal: Negative for back pain, falls and joint pain.  Skin: Negative for itching and rash.  Neurological: Negative for dizziness and headaches.  Psychiatric/Behavioral: Negative for depression, substance abuse and suicidal ideas. The patient is not nervous/anxious.    Physical Exam  Constitutional: She appears well-developed and well-nourished.  HENT:  Head: Normocephalic and atraumatic.  Neck: Normal range of motion. Neck supple.  Cardiovascular: Normal rate.  Pulmonary/Chest: Effort normal.  Abdominal: Soft. She exhibits no distension and no mass. There is tenderness. There is no rebound. No hernia.  Genitourinary:  Genitourinary Comments: small purulent crust at incision site. No drainage. No bleeding. The size of a walnut.  Skin: Skin is warm and dry.  Psychiatric: Judgment normal.  Nursing note and vitals reviewed.    ASSESSMENT/PLAN:    Problem List Items Addressed This Visit      Genitourinary   Skene's gland cyst - Primary     Patient has not improved with outpatient management, will admit for inpatient treatment and IV antibiotics.   Adelene Idlerhristanna Schuman MD 10/17/17 12:54 PM

## 2017-10-18 ENCOUNTER — Inpatient Hospital Stay: Payer: Medicaid Other

## 2017-10-18 LAB — AEROBIC/ANAEROBIC CULTURE W GRAM STAIN (SURGICAL/DEEP WOUND)

## 2017-10-18 LAB — CBC
HEMATOCRIT: 39.5 % (ref 35.0–47.0)
Hemoglobin: 13.1 g/dL (ref 12.0–16.0)
MCH: 31.2 pg (ref 26.0–34.0)
MCHC: 33.1 g/dL (ref 32.0–36.0)
MCV: 94.2 fL (ref 80.0–100.0)
Platelets: 244 10*3/uL (ref 150–440)
RBC: 4.2 MIL/uL (ref 3.80–5.20)
RDW: 12.9 % (ref 11.5–14.5)
WBC: 7.3 10*3/uL (ref 3.6–11.0)

## 2017-10-18 LAB — AEROBIC/ANAEROBIC CULTURE (SURGICAL/DEEP WOUND)

## 2017-10-18 MED ORDER — RISAQUAD PO CAPS
2.0000 | ORAL_CAPSULE | Freq: Three times a day (TID) | ORAL | Status: DC
  Administered 2017-10-18 (×2): 2 via ORAL
  Filled 2017-10-18 (×3): qty 2

## 2017-10-18 MED ORDER — HYDROCODONE-ACETAMINOPHEN 5-325 MG PO TABS
1.0000 | ORAL_TABLET | Freq: Four times a day (QID) | ORAL | Status: DC | PRN
  Administered 2017-10-19: 1 via ORAL
  Filled 2017-10-18: qty 1

## 2017-10-18 MED ORDER — IOPAMIDOL (ISOVUE-300) INJECTION 61%
100.0000 mL | Freq: Once | INTRAVENOUS | Status: AC | PRN
  Administered 2017-10-18: 100 mL via INTRAVENOUS

## 2017-10-18 MED ORDER — IOPAMIDOL (ISOVUE-300) INJECTION 61%
15.0000 mL | INTRAVENOUS | Status: AC
  Administered 2017-10-18 (×2): 15 mL via ORAL

## 2017-10-18 MED ORDER — SIMETHICONE 80 MG PO CHEW
80.0000 mg | CHEWABLE_TABLET | Freq: Four times a day (QID) | ORAL | Status: DC
  Administered 2017-10-18 (×3): 80 mg via ORAL
  Filled 2017-10-18 (×3): qty 1

## 2017-10-18 MED ORDER — IBUPROFEN 600 MG PO TABS
600.0000 mg | ORAL_TABLET | Freq: Four times a day (QID) | ORAL | Status: DC
  Administered 2017-10-18 – 2017-10-19 (×2): 600 mg via ORAL
  Filled 2017-10-18: qty 1

## 2017-10-18 MED ORDER — METOCLOPRAMIDE HCL 10 MG PO TABS
10.0000 mg | ORAL_TABLET | Freq: Three times a day (TID) | ORAL | Status: DC
  Administered 2017-10-18 (×2): 10 mg via ORAL
  Filled 2017-10-18 (×2): qty 1

## 2017-10-18 NOTE — Progress Notes (Signed)
Patient ID: Mandy Hansen, female   DOB: [birth year], 27 y.o.   MRN: [id number] Benign Gynecology Progress Note  Admission Date: 10/17/2017 Current Date: 10/18/2017  Mandy Hansen is a 27 y.o. G1P1001 HD#2 .  History complicated by: Patient Active Problem List   Diagnosis Date Noted  . Skene's gland cyst 10/11/2017  . Epigastric pain 06/30/2015   ROS and patient/family/surgical history, located on admission H&P note dated 10/17/2017, have been reviewed, and there are no changes except as noted below  Yesterday/Overnight Events:  Patient received IV antibiotics overnight. She perfromed 2 sitz baths yesterday. Subjective:  Patient reports left lower quadrant pain. She reports that she has been having regular bowel movements. Last bowel movement She denies pain with urination. No fevers overnight. She denies vaginal bleeding. Her pain is controlled with Norco. She is tolerating an oral diet. She reports a continued pinching pain in vagina.  Objective:   Vitals:   10/17/17 1649 10/17/17 1941 10/17/17 2321 10/18/17 0458  BP: 110/66 (!) 144/70 113/68 105/62  Pulse: 78 (!) 118 82 71  Resp: 18 18 18    Temp: 98.6 F (37 C) 97.9 F (36.6 C) 97.9 F (36.6 C) 98 F (36.7 C)  TempSrc: Oral Oral Oral Oral  SpO2: 99% 100% 100% 100%  Weight:  144 lb (65.3 kg)    Height:  5\' 3"  (1.6 m)     Temp:  [97.9 F (36.6 C)-99.2 F (37.3 C)] 98 F (36.7 C) (01/22 0458) Pulse Rate:  [67-139] 71 (01/22 0458) Resp:  [18] 18 (01/21 2321) BP: (105-144)/(62-86) 105/62 (01/22 0458) SpO2:  [98 %-100 %] 100 % (01/22 0458) Weight:  [144 lb (65.3 kg)] 144 lb (65.3 kg) (01/21 1941) I/O last 3 completed shifts: In: 1063.8 [I.V.:913.8; IV Piggyback:150] Out: -  No intake/output data recorded.  Intake/Output Summary (Last 24 hours) at 10/18/2017 0724 Last data filed at 10/18/2017 0115 Gross per 24 hour  Intake 1063.75 ml  Output -  Net 1063.75 ml     Current Vital Signs 24h Vital Sign Ranges  T 98 F (36.7  C) Temp  Avg: 98.5 F (36.9 C)  Min: 97.9 F (36.6 C)  Max: 99.2 F (37.3 C)  BP 105/62 BP  Min: 105/62  Max: 144/70  HR 71 Pulse  Avg: 91.3  Min: 67  Max: 139  RR 18 Resp  Avg: 18  Min: 18  Max: 18  SaO2 100 % Not Delivered SpO2  Avg: 99.2 %  Min: 98 %  Max: 100 %           24 Hour I/O Current Shift I/O  Time Ins Outs 01/21 0701 - 01/22 0700 In: 1063.8 [I.V.:913.8] Out: -  No intake/output data recorded.    Physical exam: General appearance: alert, cooperative and appears stated age Abdomen: soft, no distension, come tenderness and discomfort in left lower quadrant GU: No gross VB, no purulent discharge. Skenes gland cyst seems improved. Lungs: clear to auscultation bilaterally Heart: regular rate and rhythm and no MRGs Extremities: no redness or tenderness in the calves or thighs, no edema Skin: no lesions Psych: appropriate   Labs:   Recent Labs  Lab 10/11/17 0846 10/17/17 1248  NA 138 136  K 4.1 4.1  CL 106 103  CO2 22 23  BUN 9 12  CREATININE 0.73 0.71  GLUCOSE 89 105*   Recent Labs  Lab 10/12/17 0630 10/17/17 1250 10/18/17 0543  WBC 9.9 9.4 7.3  HGB 13.4 14.2 13.1  HCT  40.5 42.4 39.5  PLT 236 278 244    Recent Labs  Lab 10/11/17 0846 10/17/17 1248  CALCIUM 9.0 9.1   No results for input(s): INR, APTT in the last 168 hours.    Recent Labs  Lab 10/11/17 0846 10/17/17 1248  ALKPHOS 78 67  BILITOT 0.2* 0.7  PROT 7.0 6.8  ALT 10* 9*  AST 17 27    Recent Labs  Lab 10/12/17 0630 10/17/17 1250 10/18/17 0543  WBC 9.9 9.4 7.3  HGB 13.4 14.2 13.1  HCT 40.5 42.4 39.5  PLT 236 278 244   Recent Labs  Lab 10/11/17 0846 10/17/17 1248  NA 138 136  K 4.1 4.1  CL 106 103  CO2 22 23  BUN 9 12  CREATININE 0.73 0.71  CALCIUM 9.0 9.1  PROT 7.0 6.8  BILITOT 0.2* 0.7  ALKPHOS 78 67  ALT 10* 9*  AST 17 27  GLUCOSE 89 105*    Assessment & Plan:  Will continue medical management with IV antibiotics. No fevers, no elevated white count.  Will continue to monitor. Start mylicon and reglan for abdominal discomfort, could be gas.  Continue medications PRN for pain. If patient does not improve will consider imaging and consultation with urology.

## 2017-10-18 NOTE — Progress Notes (Signed)
Patient ID: [name], female   DOB: [birth year], 27 y.o.   MRN: [id number]  CT suggestive of colitis, likely from prolong antibiotic exposure. Per nursing patient had diarrhea this evening x1. Will discontinue all antibiotics at this time. Will reduce pain medicine to every 6 hours. Will continue simethicone for gas pain and a probiotic.  Adelene Idlerhristanna Teyanna Thielman MD 10/18/17 9:43 PM

## 2017-10-18 NOTE — Progress Notes (Signed)
Pt rested well. Pain meds x 2. Pt and visitor confronted about smoking in the room x 2. Both times they denied it. Extremely strong smell of cigarette smoke coming from the room. Pt even had the window cracked at one point and stated she was just hot when first approached and asked about smoking. Security made rounds and gave the pt a verbal warning.

## 2017-10-19 MED ORDER — HYDROCORTISONE 2.5 % RE CREA: TOPICAL_CREAM | RECTAL | 1 refills | Status: AC

## 2017-10-19 MED ORDER — HYDROCODONE-ACETAMINOPHEN 5-325 MG PO TABS: 1.0000 | ORAL_TABLET | Freq: Four times a day (QID) | ORAL | 0 refills | Status: DC | PRN

## 2017-10-19 NOTE — Discharge Summary (Signed)
Physician Discharge Summary  Patient ID: Mandy Hansen MRN: [id number] DOB/AGE: 1991/07/06 27 y.o.  Admit date: 10/17/2017 Discharge date: 10/19/2017  Admission Diagnoses:Skene Gland Cyst, Pain  Discharge Diagnoses:  Active Problems:   Skene's gland cyst   Discharged Condition: good  Hospital Course: Patient admitted with cyst pain from prior I&D, continued on antibiotics.  CT done, possible colitis.  Antibiotics discontinued and opatient remained afebrile and with less pain in vagina.  Still has some crampy abdominal pains.  Stable for discharge.  Consults: None  Significant Diagnostic Studies: CT  Treatments: antibiotics: completed  Discharge Exam: Blood pressure (!) 113/54, pulse 78, temperature 98.1 F (36.7 C), temperature source Oral, resp. rate 18, height 5\' 3"  (1.6 m), weight 144 lb (65.3 kg), last menstrual period 09/29/2017, SpO2 100 %. General appearance: alert, cooperative and no distress Resp: clear to auscultation bilaterally Cardio: regular rate and rhythm, S1, S2 normal, no murmur, click, rub or gallop GI: soft, non-tender; bowel sounds normal; no masses,  no organomegaly Pelvic deferred  Disposition: 01-Home or Self Care  Discharge Instructions    Call MD for:  persistant nausea and vomiting   Complete by:  As directed    Call MD for:  severe uncontrolled pain   Complete by:  As directed    Call MD for:  temperature >100.4   Complete by:  As directed    Discharge instructions   Complete by:  As directed    Schedule follow up w Dr Mandy Hansen or Mandy Hansen next week     Allergies as of 10/19/2017   No Known Allergies     Medication List    STOP taking these medications   amoxicillin-clavulanate 125-31.25 MG/5ML suspension Commonly known as:  AUGMENTIN   docusate sodium 100 MG capsule Commonly known as:  COLACE   doxycycline 100 MG capsule Commonly known as:  VIBRAMYCIN   oxyCODONE 5 MG immediate release tablet Commonly known as:  Oxy  IR/ROXICODONE     TAKE these medications   CALCIUM 600-D PO Take 1 tablet by mouth daily at 12 noon.   DULoxetine 20 MG capsule Commonly known as:  CYMBALTA Take 20 mg by mouth daily.   ferrous sulfate 325 (65 FE) MG tablet Take 325 mg by mouth daily.   gabapentin 300 MG capsule Commonly known as:  NEURONTIN TAKE 2 CAPSULES BY MOUTH TWICE DAILY   HYDROcodone-acetaminophen 5-325 MG tablet Commonly known as:  NORCO/VICODIN Take 1 tablet by mouth every 6 (six) hours as needed for moderate pain.   hydrocortisone 2.5 % rectal cream Commonly known as:  ANUSOL-HC Apply rectally 2 times daily   ibuprofen 800 MG tablet Commonly known as:  ADVIL,MOTRIN Take 1 tablet (800 mg total) by mouth every 8 (eight) hours as needed.   Melatonin 5 MG Tabs Take 5 mg by mouth at bedtime as needed (for sleep).   ondansetron 4 MG tablet Commonly known as:  ZOFRAN Take 1 tablet (4 mg total) by mouth every 8 (eight) hours as needed for nausea or vomiting.      Follow-up Information    Hansen, Mandy Bitterhristanna R, MD. Schedule an appointment as soon as possible for a visit in 1 week(s).   Specialty:  Obstetrics and Gynecology Contact information: [address] [place] [address] [phone]           Signed: [name] 10/19/2017, 7:47 AM

## 2017-10-19 NOTE — Progress Notes (Signed)
IV was removed. Discharge instructions, follow-up appointments, and prescriptions were provided to the pt. All questions answered. The pt was taken downstairs via wheelchair by NT.  

## 2017-10-27 ENCOUNTER — Encounter: Payer: Self-pay | Admitting: Obstetrics and Gynecology

## 2017-10-27 ENCOUNTER — Ambulatory Visit: Payer: Self-pay | Admitting: Obstetrics and Gynecology

## 2017-10-27 ENCOUNTER — Ambulatory Visit (INDEPENDENT_AMBULATORY_CARE_PROVIDER_SITE_OTHER): Payer: Self-pay | Admitting: Obstetrics and Gynecology

## 2017-10-27 VITALS — BP 120/70 | HR 78 | Ht 63.0 in | Wt 150.0 lb

## 2017-10-27 DIAGNOSIS — N368 Other specified disorders of urethra: Secondary | ICD-10-CM

## 2017-10-27 DIAGNOSIS — Z09 Encounter for follow-up examination after completed treatment for conditions other than malignant neoplasm: Secondary | ICD-10-CM

## 2017-10-27 DIAGNOSIS — Z30013 Encounter for initial prescription of injectable contraceptive: Secondary | ICD-10-CM

## 2017-10-27 MED ORDER — MEDROXYPROGESTERONE ACETATE 150 MG/ML IM SUSP: 150.0000 mg | INTRAMUSCULAR | 4 refills | Status: DC

## 2017-10-27 NOTE — Progress Notes (Signed)
  History of Present Illness:  Mandy Hansen is a 27 y.o. that is here to follow up for skene's gland abscess. She has been feeling better. Some mild complaints of occasional vaginal pain at night. She reports that she is only taking tylenol.  She reports that her period started last week. She would like to be on Depo Provera for birth control   PMHx: She  has a past medical history of Dysplasia of cervix (2013) and Psoriasis of scalp (2004). Also,  has a past surgical history that includes Cesarean section; Cesarean section (N/A, 2013); laparoscopy (N/A, 05/23/2015); Cystoscopy (N/A, 05/23/2015); Esophagogastroduodenoscopy (N/A, 07/02/2015); Abdominal surgery; Appendectomy; and Incision and drainage abscess (N/A, 10/11/2017)., family history includes Cancer in her maternal aunt, maternal grandmother, and mother.,  reports that she has quit smoking. Her smoking use included cigarettes. She smoked 0.50 packs per day. she has never used smokeless tobacco. She reports that she uses drugs. Drug: Marijuana. She reports that she does not drink alcohol. No outpatient medications have been marked as taking for the 10/27/17 encounter (Office Visit) with [name], Hinda Lindor R, MD.  .  Also, has No Known Allergies..  Review of Systems  Constitutional: Negative for chills, fever, malaise/fatigue and weight loss.  HENT: Negative for congestion, hearing loss and sinus pain.   Eyes: Negative for blurred vision and double vision.  Respiratory: Negative for cough, sputum production, shortness of breath and wheezing.   Cardiovascular: Negative for chest pain, palpitations, orthopnea and leg swelling.  Gastrointestinal: Negative for abdominal pain, constipation, diarrhea, nausea and vomiting.  Genitourinary: Negative for dysuria, flank pain, frequency, hematuria and urgency.  Musculoskeletal: Negative for back pain, falls and joint pain.  Skin: Negative for itching and rash.  Neurological: Negative for dizziness and  headaches.  Psychiatric/Behavioral: Negative for depression, substance abuse and suicidal ideas. The patient is not nervous/anxious.     Physical Exam:  BP 120/70   Pulse 78   Ht 5\' 3"  (1.6 m)   Wt 150 lb (68 kg)   LMP 09/29/2017 Comment: neg preg 10/11/17  BMI 26.57 kg/m  Body mass index is 26.57 kg/m. Constitutional: Well nourished, well developed female in no acute distress.  Abdomen: diffusely non tender to palpation, non distended, and no masses, hernias Neuro: Grossly intact Psych:  Normal mood and affect.    Pelvic exam:  Small amount of old brown blood in vaginal vault. Small mobile non-tender uterus. No adnexal masses. Anterior vaginal wall/incision  healed, no inflammation or induration.   Assessment: Skene's gland cyst, healing well, desires Depot Provera for contraception.  Plan: Provera ordered today, will have patient return for injection today since she is on her period. Return in one month for an annual exam.   More than ten minutes were spent face to face counseling the patient.   Adelene Idlerhristanna Jennell Janosik, MD [facility], [facility] 10/27/2017  12:57 PM

## 2017-10-28 ENCOUNTER — Telehealth: Payer: Self-pay

## 2017-10-28 NOTE — Telephone Encounter (Signed)
Pt reports bleeding has turned into a nasty, brown puss looking color. She is unable to sleep at night at all. She is hoping to request a pain medication to help her get thru the night. She is also requesting an abx rx as she is worried it may be infected again. WU#[id number]#[phone]

## 2017-10-29 NOTE — Telephone Encounter (Signed)
No pain medication or antibiotics, she can make an appointment to be seen or go to there emergency room..[name]

## 2017-11-22 ENCOUNTER — Telehealth: Payer: Self-pay

## 2017-11-22 NOTE — Telephone Encounter (Signed)
No time off work indicated per CS or RPH. Pt can return to work. Surgery was 1/15 and pt has already had follow up.

## 2017-11-22 NOTE — Telephone Encounter (Signed)
Pt needs note for work saying she's okay to return to work from having surgery in Jan.  Pt doesn't have a cb#.  Her email is [email or web address] or she gives permission for her mom, Lanny CrampDonna Sharp to be called to let them know it's ready to p/u.  Mom's # is [phone]

## 2017-11-22 NOTE — Telephone Encounter (Signed)
Dr Jerene PitchSchuman did surgery and follow up, but I see no indication that she needs to be out of work. Provide note if necessary.

## 2017-11-22 NOTE — Telephone Encounter (Signed)
Pt's mom, Lanny CrampDonna Sharp, aware note is ready for p/u.

## 2017-11-23 ENCOUNTER — Ambulatory Visit (INDEPENDENT_AMBULATORY_CARE_PROVIDER_SITE_OTHER): Payer: Medicaid Other

## 2017-11-23 DIAGNOSIS — Z308 Encounter for other contraceptive management: Secondary | ICD-10-CM

## 2017-11-23 DIAGNOSIS — Z3042 Encounter for surveillance of injectable contraceptive: Secondary | ICD-10-CM | POA: Diagnosis not present

## 2017-11-23 MED ORDER — MEDROXYPROGESTERONE ACETATE 150 MG/ML IM SUSP
150.0000 mg | Freq: Once | INTRAMUSCULAR | Status: AC
  Administered 2017-11-23: 150 mg via INTRAMUSCULAR

## 2017-11-23 NOTE — Progress Notes (Signed)
Pt here for depo which was given IM right deltoid per pt request.  Pt's LMP is today.  NDC# [phone]

## 2018-02-15 ENCOUNTER — Ambulatory Visit: Payer: Medicaid Other

## 2018-02-28 ENCOUNTER — Ambulatory Visit (INDEPENDENT_AMBULATORY_CARE_PROVIDER_SITE_OTHER): Payer: Medicaid Other | Admitting: Obstetrics and Gynecology

## 2018-02-28 ENCOUNTER — Encounter: Payer: Self-pay | Admitting: Obstetrics and Gynecology

## 2018-02-28 VITALS — BP 114/70 | HR 118 | Temp 98.6°F | Ht 63.0 in | Wt 144.0 lb

## 2018-02-28 DIAGNOSIS — N3001 Acute cystitis with hematuria: Secondary | ICD-10-CM | POA: Diagnosis not present

## 2018-02-28 LAB — POCT URINALYSIS DIPSTICK
BILIRUBIN UA: NEGATIVE
GLUCOSE UA: NEGATIVE
Ketones, UA: NEGATIVE
Nitrite, UA: NEGATIVE
Protein, UA: NEGATIVE
Spec Grav, UA: 1.01 (ref 1.010–1.025)
pH, UA: 7 (ref 5.0–8.0)

## 2018-02-28 MED ORDER — CIPROFLOXACIN HCL 500 MG PO TABS: 500.0000 mg | ORAL_TABLET | Freq: Two times a day (BID) | ORAL | 0 refills | Status: AC

## 2018-02-28 NOTE — Patient Instructions (Signed)
I value your feedback and entrusting us with your care. If you get a [facility] patient survey, I would appreciate you taking the time to let us know about your experience today. Thank you! 

## 2018-02-28 NOTE — Progress Notes (Signed)
Oswaldo ConroyBender, Abby Daneele, MD   Chief Complaint  Patient presents with  . Urinary Tract Infection    Pain in lower back bilaterally, pink urine, frequent urination     HPI:      Mandy Hansen is a 27 y.o. G1P1001 who LMP was No LMP recorded., presents today for UTI sx of urinary urgency, frequency, odor, suprapubic pressure, hematuria and LBP since yesterday.  No fevers, no vag sx, no vag bleeding. No hx of UTIs or kidney stones in the past.  S/P I&D of Skene's gland 1/19.    Past Medical History:  Diagnosis Date  . Dysplasia of cervix 2013   dyplasia has resolved since birth of son in 2013.  [name]. Psoriasis of scalp 2004   mainly scalp, uses sun for treatment    Past Surgical History:  Procedure Laterality Date  . ABDOMINAL SURGERY    . APPENDECTOMY    . CESAREAN SECTION    . CESAREAN SECTION N/A 2013  . CYSTOSCOPY N/A 05/23/2015   Procedure: CYSTOSCOPY;  Surgeon: Nadara Mustardobert P Harris, MD;  Location: [facility];  Service: Gynecology;  Laterality: N/A;  . ESOPHAGOGASTRODUODENOSCOPY N/A 07/02/2015   Procedure: ESOPHAGOGASTRODUODENOSCOPY (EGD) looking in the esophagus stomach and upper small intestine with a lighted tube to evaluate and treat;  Surgeon: Christena DeemMartin U Skulskie, MD;  [facility];  Service: Endoscopy;  Laterality: N/A;  . INCISION AND DRAINAGE ABSCESS N/A 10/11/2017   Procedure: INCISION AND DRAINAGE SKENE CYST;  Surgeon: [name], Christanna R, MD;  Location: [facility] OR[facility];  Service: Gynecology;  Laterality: N/A;  . LAPAROSCOPY N/A 05/23/2015   Procedure: LAPAROSCOPY DIAGNOSTIC;  Surgeon: Nadara Mustardobert P Harris, MD;  Location: [facility] OR[facility];  Service: Gynecology;  Laterality: N/A;    Family History  Problem Relation Age of Onset  . Cancer Mother   . Cancer Maternal Aunt   . Cancer Maternal Grandmother     Social History   Socioeconomic History  . Marital status: Single    Spouse name: Not on file  . Number of children: Not on file  . Years of education: Not on file  .  Highest education level: Not on file  Occupational History  . Not on file  Social Needs  . Financial resource strain: Not on file  . Food insecurity:    Worry: Not on file    Inability: Not on file  . Transportation needs:    Medical: Not on file    Non-medical: Not on file  Tobacco Use  . Smoking status: Former Smoker    Packs/day: 0.50    Types: Cigarettes  . Smokeless tobacco: Never Used  Substance and Sexual Activity  . Alcohol use: No    Alcohol/week: 0.0 oz  . Drug use: Yes    Types: Marijuana    Comment: Once every 3 months  . Sexual activity: Yes    Birth control/protection: Injection  Lifestyle  . Physical activity:    Days per week: Not on file    Minutes per session: Not on file  . Stress: Not on file  Relationships  . Social connections:    Talks on phone: Not on file    Gets together: Not on file    Attends religious service: Not on file    Active member of club or organization: Not on file    Attends meetings of clubs or organizations: Not on file    Relationship status: Not on file  . Intimate partner violence:  Fear of current or ex partner: Not on file    Emotionally abused: Not on file    Physically abused: Not on file    Forced sexual activity: Not on file  Other Topics Concern  . Not on file  Social History Narrative  . Not on file    Outpatient Medications Prior to Visit  Medication Sig Dispense Refill  . medroxyPROGESTERone (DEPO-PROVERA) 150 MG/ML injection Inject 1 mL (150 mg total) into the muscle every 3 (three) months. 1 mL 4  . pantoprazole (PROTONIX) 40 MG tablet pantoprazole 40 mg tablet,delayed release  TAKE 1 TABLET BY MOUTH EVERY DAY    . Calcium Carb-Cholecalciferol (CALCIUM 600-D PO) Take 1 tablet by mouth daily at 12 noon.    . DULoxetine (CYMBALTA) 20 MG capsule Take 20 mg by mouth daily.    . ferrous sulfate 325 (65 FE) MG tablet Take 325 mg by mouth daily.    [name] gabapentin (NEURONTIN) 300 MG capsule TAKE 2 CAPSULES BY  MOUTH TWICE DAILY    . HYDROcodone-acetaminophen (NORCO/VICODIN) 5-325 MG tablet Take 1 tablet by mouth every 6 (six) hours as needed for moderate pain. (Patient not taking: Reported on 10/27/2017) 15 tablet 0  . hydrocortisone (ANUSOL-HC) 2.5 % rectal cream Apply rectally 2 times daily (Patient not taking: Reported on 10/27/2017) 30 g 1  . ibuprofen (ADVIL,MOTRIN) 800 MG tablet Take 1 tablet (800 mg total) by mouth every 8 (eight) hours as needed. (Patient not taking: Reported on 10/13/2017) 30 tablet 0  . Melatonin 5 MG TABS Take 5 mg by mouth at bedtime as needed (for sleep).    . ondansetron (ZOFRAN) 4 MG tablet Take 1 tablet (4 mg total) by mouth every 8 (eight) hours as needed for nausea or vomiting. (Patient not taking: Reported on 10/27/2017) 8 tablet 0   No facility-administered medications prior to visit.       ROS:  Review of Systems  Constitutional: Negative for fever.  Gastrointestinal: Negative for blood in stool, constipation, diarrhea, nausea and vomiting.  Genitourinary: Positive for frequency and urgency. Negative for dyspareunia, dysuria, flank pain, hematuria, vaginal bleeding, vaginal discharge and vaginal pain.  Musculoskeletal: Positive for back pain.  Skin: Negative for rash.    OBJECTIVE:   Vitals:  BP 114/70   Pulse (!) 118   Temp 98.6 F (37 C)   Ht 5\' 3"  (1.6 m)   Wt 144 lb (65.3 kg)   BMI 25.51 kg/m   Physical Exam  Constitutional: She is oriented to person, place, and time. She appears well-developed.  Neck: Normal range of motion.  Pulmonary/Chest: Effort normal.  Abdominal: There is CVA tenderness.  Neurological: She is alert and oriented to person, place, and time. No cranial nerve deficit.  Psychiatric: She has a normal mood and affect. Her behavior is normal. Judgment and thought content normal.  Vitals reviewed.   Results: Results for orders placed or performed in visit on 02/28/18 (from the past 24 hour(s))  POCT Urinalysis Dipstick      Status: Abnormal   Collection Time: 02/28/18 11:19 AM  Result Value Ref Range   Color, UA pink    Clarity, UA cloudy    Glucose, UA Negative Negative   Bilirubin, UA neg    Ketones, UA neg    Spec Grav, UA 1.010 1.010 - 1.025   Blood, UA large    pH, UA 7.0 5.0 - 8.0   Protein, UA Negative Negative   Urobilinogen, UA  0.2 or 1.0 E.U./dL  Nitrite, UA neg    Leukocytes, UA Small (1+) (A) Negative   Appearance     Odor       Assessment/Plan: Acute cystitis with hematuria - Pos dip/sx. No fever today. Rx cipro. Check C&S. Will call with results. F/u prn/go to ED if sx worsen with fever. - Plan: POCT Urinalysis Dipstick, ciprofloxacin (CIPRO) 500 MG tablet, Urine Culture    Meds ordered this encounter  Medications  . ciprofloxacin (CIPRO) 500 MG tablet    Sig: Take 1 tablet (500 mg total) by mouth 2 (two) times daily for 5 days.    Dispense:  10 tablet    Refill:  0    Order Specific Question:   Supervising Provider    Answer:   Nadara Mustard [[id number]]      Return if symptoms worsen or fail to improve.  Alicia B. Copland, PA-C 02/28/2018 11:22 AM

## 2018-03-01 ENCOUNTER — Encounter: Payer: Self-pay | Admitting: Obstetrics and Gynecology

## 2018-03-02 ENCOUNTER — Emergency Department: Payer: Medicaid Other

## 2018-03-02 ENCOUNTER — Encounter: Payer: Self-pay | Admitting: Emergency Medicine

## 2018-03-02 ENCOUNTER — Other Ambulatory Visit: Payer: Self-pay

## 2018-03-02 ENCOUNTER — Encounter: Payer: Self-pay | Admitting: Obstetrics and Gynecology

## 2018-03-02 ENCOUNTER — Emergency Department
Admission: EM | Admit: 2018-03-02 | Discharge: 2018-03-02 | Disposition: A | Discharge status: Home / Self Care | Payer: Medicaid Other | Attending: Student in an Organized Health Care Education/Training Program | Admitting: Student in an Organized Health Care Education/Training Program

## 2018-03-02 DIAGNOSIS — F1210 Cannabis abuse, uncomplicated: Secondary | ICD-10-CM | POA: Diagnosis not present

## 2018-03-02 DIAGNOSIS — Z79899 Other long term (current) drug therapy: Secondary | ICD-10-CM | POA: Diagnosis not present

## 2018-03-02 DIAGNOSIS — R102 Pelvic and perineal pain: Secondary | ICD-10-CM

## 2018-03-02 DIAGNOSIS — Z87891 Personal history of nicotine dependence: Secondary | ICD-10-CM | POA: Diagnosis not present

## 2018-03-02 DIAGNOSIS — R1030 Lower abdominal pain, unspecified: Secondary | ICD-10-CM | POA: Diagnosis present

## 2018-03-02 LAB — COMPREHENSIVE METABOLIC PANEL
ALT: 15 U/L (ref 14–54)
AST: 20 U/L (ref 15–41)
Albumin: 4.5 g/dL (ref 3.5–5.0)
Alkaline Phosphatase: 57 U/L (ref 38–126)
Anion gap: 8 (ref 5–15)
BILIRUBIN TOTAL: 0.5 mg/dL (ref 0.3–1.2)
BUN: 10 mg/dL (ref 6–20)
CHLORIDE: 108 mmol/L (ref 101–111)
CO2: 23 mmol/L (ref 22–32)
Calcium: 9.2 mg/dL (ref 8.9–10.3)
Creatinine, Ser: 0.65 mg/dL (ref 0.44–1.00)
GFR calc Af Amer: >60 mL/min (ref >60)
Glucose, Bld: 108 mg/dL — ABNORMAL HIGH (ref 65–99)
Potassium: 4.9 mmol/L (ref 3.5–5.1)
Sodium: 139 mmol/L (ref 135–145)
Total Protein: 7.1 g/dL (ref 6.5–8.1)

## 2018-03-02 LAB — URINALYSIS, COMPLETE (UACMP) WITH MICROSCOPIC
Bacteria, UA: NONE SEEN
Bilirubin Urine: NEGATIVE
GLUCOSE, UA: NEGATIVE mg/dL
Hgb urine dipstick: NEGATIVE
Ketones, ur: NEGATIVE mg/dL
Leukocytes, UA: NEGATIVE
Nitrite: NEGATIVE
PROTEIN: NEGATIVE mg/dL
Specific Gravity, Urine: 1.001 — ABNORMAL LOW (ref 1.005–1.030)
pH: 7 (ref 5.0–8.0)

## 2018-03-02 LAB — CHLAMYDIA/NGC RT PCR (ARMC ONLY)
Chlamydia Tr: NOT DETECTED
N gonorrhoeae: NOT DETECTED

## 2018-03-02 LAB — CBC WITH DIFFERENTIAL/PLATELET
Basophils Absolute: 0 10*3/uL (ref 0–0.1)
Basophils Relative: 0 %
EOS ABS: 0.2 10*3/uL (ref 0–0.7)
Eosinophils Relative: 2 %
HCT: 44 % (ref 35.0–47.0)
HEMOGLOBIN: 14.7 g/dL (ref 12.0–16.0)
LYMPHS ABS: 1.4 10*3/uL (ref 1.0–3.6)
LYMPHS PCT: 13 %
MCH: 31.5 pg (ref 26.0–34.0)
MCHC: 33.5 g/dL (ref 32.0–36.0)
MCV: 94.1 fL (ref 80.0–100.0)
Monocytes Absolute: 0.5 10*3/uL (ref 0.2–0.9)
Monocytes Relative: 5 %
NEUTROS PCT: 80 %
Neutro Abs: 8.5 10*3/uL — ABNORMAL HIGH (ref 1.4–6.5)
Platelets: 253 10*3/uL (ref 150–440)
RBC: 4.67 MIL/uL (ref 3.80–5.20)
RDW: 13.3 % (ref 11.5–14.5)
WBC: 10.7 10*3/uL (ref 3.6–11.0)

## 2018-03-02 LAB — WET PREP, GENITAL
CLUE CELLS WET PREP: NONE SEEN
Sperm: NONE SEEN
TRICH WET PREP: NONE SEEN
Yeast Wet Prep HPF POC: NONE SEEN

## 2018-03-02 LAB — POC URINE PREG, ED: PREG TEST UR: NEGATIVE

## 2018-03-02 LAB — URINE CULTURE

## 2018-03-02 LAB — LIPASE, BLOOD: LIPASE: 30 U/L (ref 11–51)

## 2018-03-02 MED ORDER — KETOROLAC TROMETHAMINE 30 MG/ML IJ SOLN
15.0000 mg | Freq: Once | INTRAMUSCULAR | Status: AC
  Administered 2018-03-02: 15 mg via INTRAVENOUS
  Filled 2018-03-02: qty 1

## 2018-03-02 MED ORDER — CEFTRIAXONE SODIUM 250 MG IJ SOLR
250.0000 mg | Freq: Once | INTRAMUSCULAR | Status: AC
  Administered 2018-03-02: 250 mg via INTRAMUSCULAR
  Filled 2018-03-02: qty 250

## 2018-03-02 MED ORDER — AZITHROMYCIN 500 MG PO TABS
1000.0000 mg | ORAL_TABLET | Freq: Once | ORAL | Status: AC
  Administered 2018-03-02: 1000 mg via ORAL
  Filled 2018-03-02: qty 2

## 2018-03-02 MED ORDER — PHENAZOPYRIDINE HCL 100 MG PO TABS: 100.0000 mg | ORAL_TABLET | Freq: Three times a day (TID) | ORAL | 0 refills | Status: DC | PRN

## 2018-03-02 MED ORDER — LIDOCAINE HCL (PF) 1 % IJ SOLN
5.0000 mL | Freq: Once | INTRAMUSCULAR | Status: AC
  Administered 2018-03-02: 5 mL via INTRADERMAL
  Filled 2018-03-02: qty 5

## 2018-03-02 MED ORDER — PROMETHAZINE HCL 25 MG/ML IJ SOLN
12.5000 mg | Freq: Four times a day (QID) | INTRAMUSCULAR | Status: DC | PRN
  Administered 2018-03-02: 12.5 mg via INTRAVENOUS
  Filled 2018-03-02: qty 1

## 2018-03-02 MED ORDER — MORPHINE SULFATE (PF) 4 MG/ML IV SOLN
4.0000 mg | INTRAVENOUS | Status: DC | PRN
  Administered 2018-03-02 (×2): 4 mg via INTRAVENOUS
  Filled 2018-03-02 (×2): qty 1

## 2018-03-02 MED ORDER — HYDROCODONE-ACETAMINOPHEN 5-325 MG PO TABS: 1.0000 | ORAL_TABLET | ORAL | 0 refills | Status: DC | PRN

## 2018-03-02 MED ORDER — LIDOCAINE HCL (PF) 1 % IJ SOLN
INTRAMUSCULAR | Status: AC
  Administered 2018-03-02: 5 mL via INTRADERMAL
  Filled 2018-03-02: qty 5

## 2018-03-02 NOTE — ED Notes (Signed)
Patient transported to Ultrasound 

## 2018-03-02 NOTE — ED Triage Notes (Addendum)
Pt to triage via w/c, appears uncomfortable; st since Monday having mid lower abd pain; seen PCP Tues and rx antibiotic for ?UTI but pain & fever persists

## 2018-03-02 NOTE — Discharge Instructions (Signed)

## 2018-03-02 NOTE — ED Provider Notes (Signed)
[facility] Emergency Department Provider Note    First MD Initiated Contact with Patient 03/02/18 [phone]     (approximate)  I have reviewed the triage vital signs and the nursing notes.   HISTORY  Chief Complaint Abdominal Pain    HPI Mandy Hansen is a 27 y.o. female presents the ER with chief complaint of several days of mid lower abdominal pain particular on the left side and suprapubic area worse when she urinates.  Saw her PCP and was started on antibiotic for possible UTI but the pain has persisted.  No fevers.  No nausea or vomiting.  She status post appendectomy.  States is burning in nature.  Denies any vaginal discharge or spotting.  No history of kidney stones.  No flank pain.    Past Medical History:  Diagnosis Date  . Dysplasia of cervix 2013   dyplasia has resolved since birth of son in 2013.  [name] Psoriasis of scalp 2004   mainly scalp, uses sun for treatment   Family History  Problem Relation Age of Onset  . Cancer Mother   . Cancer Maternal Aunt   . Cancer Maternal Grandmother    Past Surgical History:  Procedure Laterality Date  . ABDOMINAL SURGERY    . APPENDECTOMY    . CESAREAN SECTION    . CESAREAN SECTION N/A 2013  . CYSTOSCOPY N/A 05/23/2015   Procedure: CYSTOSCOPY;  Surgeon: Nadara Mustard, MD;  Location: [facility] ORS;  Service: Gynecology;  Laterality: N/A;  . ESOPHAGOGASTRODUODENOSCOPY N/A 07/02/2015   Procedure: ESOPHAGOGASTRODUODENOSCOPY (EGD) looking in the esophagus stomach and upper small intestine with a lighted tube to evaluate and treat;  Surgeon: Christena Deem, MD;  Location: [facility] ENDOSCOPY;  Service: Endoscopy;  Laterality: N/A;  . INCISION AND DRAINAGE ABSCESS N/A 10/11/2017   Procedure: INCISION AND DRAINAGE SKENE CYST;  Surgeon: Natale Milch, MD;  Location: [facility] ORS;  Service: Gynecology;  Laterality: N/A;  . LAPAROSCOPY N/A 05/23/2015   Procedure: LAPAROSCOPY DIAGNOSTIC;  Surgeon: Nadara Mustard, MD;   Location: [facility] ORS;  Service: Gynecology;  Laterality: N/A;   Patient Active Problem List   Diagnosis Date Noted  . Epigastric pain 06/30/2015      Prior to Admission medications   Medication Sig Start Date End Date Taking? Authorizing Provider  Calcium Carb-Cholecalciferol (CALCIUM 600-D PO) Take 1 tablet by mouth daily at 12 noon.    [provider]  ciprofloxacin (CIPRO) 500 MG tablet Take 1 tablet (500 mg total) by mouth 2 (two) times daily for 5 days. 02/28/18 03/05/18  Copland, Ilona Sorrel, PA-C  DULoxetine (CYMBALTA) 20 MG capsule Take 20 mg by mouth daily.    [provider]  ferrous sulfate 325 (65 FE) MG tablet Take 325 mg by mouth daily.    [provider]  gabapentin (NEURONTIN) 300 MG capsule TAKE 2 CAPSULES BY MOUTH TWICE DAILY 07/22/16   [provider]  HYDROcodone-acetaminophen (NORCO) 5-325 MG tablet Take 1 tablet by mouth every 4 (four) hours as needed for moderate pain. 03/02/18   Willy Eddy, MD  HYDROcodone-acetaminophen (NORCO/VICODIN) 5-325 MG tablet Take 1 tablet by mouth every 6 (six) hours as needed for moderate pain. Patient not taking: Reported on 10/27/2017 10/19/17   Nadara Mustard, MD  hydrocortisone (ANUSOL-HC) 2.5 % rectal cream Apply rectally 2 times daily Patient not taking: Reported on 10/27/2017 10/19/17 10/19/18  Nadara Mustard, MD  ibuprofen (ADVIL,MOTRIN) 800 MG tablet Take 1 tablet (800 mg total)  by mouth every 8 (eight) hours as needed. Patient not taking: Reported on 10/13/2017 10/12/17   [name], Christanna R, MD  medroxyPROGESTERone (DEPO-PROVERA) 150 MG/ML injection Inject 1 mL (150 mg total) into the muscle every 3 (three) months. 10/27/17 02/28/18  [name], Christanna R, MD  Melatonin 5 MG TABS Take 5 mg by mouth at bedtime as needed (for sleep).    [provider]  ondansetron (ZOFRAN) 4 MG tablet Take 1 tablet (4 mg total) by mouth every 8 (eight) hours as needed for nausea or vomiting. Patient not  taking: Reported on 10/27/2017 10/07/17   Jeanmarie PlantMcShane, James A, MD  pantoprazole (PROTONIX) 40 MG tablet pantoprazole 40 mg tablet,delayed release  TAKE 1 TABLET BY MOUTH EVERY DAY 07/01/15   [provider]  phenazopyridine (PYRIDIUM) 100 MG tablet Take 1 tablet (100 mg total) by mouth 3 (three) times daily as needed for pain. 03/02/18 03/02/19  Willy Eddyobinson, Tersea Aulds, MD    Allergies Tramadol    Social History Social History   Tobacco Use  . Smoking status: Former Smoker    Packs/day: 0.50    Types: Cigarettes  . Smokeless tobacco: Never Used  Substance Use Topics  . Alcohol use: No    Alcohol/week: 0.0 oz  . Drug use: Yes    Types: Marijuana    Comment: Once every 3 months    Review of Systems Patient denies headaches, rhinorrhea, blurry vision, numbness, shortness of breath, chest pain, edema, cough, abdominal pain, nausea, vomiting, diarrhea, dysuria, fevers, rashes or hallucinations unless otherwise stated above in HPI. ____________________________________________   PHYSICAL EXAM:  VITAL SIGNS: Vitals:   03/02/18 0728 03/02/18 0842  BP: 121/70   Pulse: 88 67  Resp:    Temp:    SpO2: 99% 98%    Constitutional: Alert and oriented.  Eyes: Conjunctivae are normal.  Head: Atraumatic. Nose: No congestion/rhinnorhea. Mouth/Throat: Mucous membranes are moist.   Neck: No stridor. Painless ROM.  Cardiovascular: Normal rate, regular rhythm. Grossly normal heart sounds.  Good peripheral circulation. Respiratory: Normal respiratory effort.  No retractions. Lungs CTAB. Gastrointestinal: Soft and nontender. No distention. No abdominal bruits. No CVA tenderness. Genitourinary: deferred Musculoskeletal: No lower extremity tenderness nor edema.  No joint effusions. Neurologic:  Normal speech and language. No gross focal neurologic deficits are appreciated. No facial droop Skin:  Skin is warm, dry and intact. No rash noted. Psychiatric: Mood and affect are normal. Speech and  behavior are normal.  ____________________________________________   LABS (all labs ordered are listed, but only abnormal results are displayed)  Results for orders placed or performed during the hospital encounter of 03/02/18 (from the past 24 hour(s))  CBC with Differential/Platelet     Status: Abnormal   Collection Time: 03/02/18  7:23 AM  Result Value Ref Range   WBC 10.7 3.6 - 11.0 K/uL   RBC 4.67 3.80 - 5.20 MIL/uL   Hemoglobin 14.7 12.0 - 16.0 g/dL   HCT 65.744.0 84.635.0 - 96.247.0 %   MCV 94.1 80.0 - 100.0 fL   MCH 31.5 26.0 - 34.0 pg   MCHC 33.5 32.0 - 36.0 g/dL   RDW 95.213.3 84.111.5 - 32.414.5 %   Platelets 253 150 - 440 K/uL   Neutrophils Relative % 80 %   Neutro Abs 8.5 (H) 1.4 - 6.5 K/uL   Lymphocytes Relative 13 %   Lymphs Abs 1.4 1.0 - 3.6 K/uL   Monocytes Relative 5 %   Monocytes Absolute 0.5 0.2 - 0.9 K/uL   Eosinophils Relative 2 %  Eosinophils Absolute 0.2 0 - 0.7 K/uL   Basophils Relative 0 %   Basophils Absolute 0.0 0 - 0.1 K/uL  Comprehensive metabolic panel     Status: Abnormal   Collection Time: 03/02/18  7:23 AM  Result Value Ref Range   Sodium 139 135 - 145 mmol/L   Potassium 4.9 3.5 - 5.1 mmol/L   Chloride 108 101 - 111 mmol/L   CO2 23 22 - 32 mmol/L   Glucose, Bld 108 (H) 65 - 99 mg/dL   BUN 10 6 - 20 mg/dL   Creatinine, Ser 2.13 0.44 - 1.00 mg/dL   Calcium 9.2 8.9 - 08.6 mg/dL   Total Protein 7.1 6.5 - 8.1 g/dL   Albumin 4.5 3.5 - 5.0 g/dL   AST 20 15 - 41 U/L   ALT 15 14 - 54 U/L   Alkaline Phosphatase 57 38 - 126 U/L   Total Bilirubin 0.5 0.3 - 1.2 mg/dL   GFR calc non Af Amer >60 >60 mL/min   GFR calc Af Amer >60 >60 mL/min   Anion gap 8 5 - 15  Lipase, blood     Status: None   Collection Time: 03/02/18  7:23 AM  Result Value Ref Range   Lipase 30 11 - 51 U/L  Urinalysis, Complete w Microscopic     Status: Abnormal   Collection Time: 03/02/18  7:23 AM  Result Value Ref Range   Color, Urine COLORLESS (A) YELLOW   APPearance CLEAR (A) CLEAR    Specific Gravity, Urine 1.001 (L) 1.005 - 1.030   pH 7.0 5.0 - 8.0   Glucose, UA NEGATIVE NEGATIVE mg/dL   Hgb urine dipstick NEGATIVE NEGATIVE   Bilirubin Urine NEGATIVE NEGATIVE   Ketones, ur NEGATIVE NEGATIVE mg/dL   Protein, ur NEGATIVE NEGATIVE mg/dL   Nitrite NEGATIVE NEGATIVE   Leukocytes, UA NEGATIVE NEGATIVE   RBC / HPF 0-5 0 - 5 RBC/hpf   WBC, UA 0-5 0 - 5 WBC/hpf   Bacteria, UA NONE SEEN NONE SEEN   Squamous Epithelial / LPF 0-5 0 - 5  POC Urine Pregnancy, ED     Status: None   Collection Time: 03/02/18  7:28 AM  Result Value Ref Range   Preg Test, Ur Negative Negative  Wet prep, genital     Status: Abnormal   Collection Time: 03/02/18 10:12 AM  Result Value Ref Range   Yeast Wet Prep HPF POC NONE SEEN NONE SEEN   Trich, Wet Prep NONE SEEN NONE SEEN   Clue Cells Wet Prep HPF POC NONE SEEN NONE SEEN   WBC, Wet Prep HPF POC MODERATE (A) NONE SEEN   Sperm NONE SEEN    ____________________________________________ ____________________________________________  RADIOLOGY  I personally reviewed all radiographic images ordered to evaluate for the above acute complaints and reviewed radiology reports and findings.  These findings were personally discussed with the patient.  Please see medical record for radiology report.  ____________________________________________   PROCEDURES  Procedure(s) performed:  Procedures    Critical Care performed: no ____________________________________________   INITIAL IMPRESSION / ASSESSMENT AND PLAN / ED COURSE  Pertinent labs & imaging results that were available during my care of the patient were reviewed by me and considered in my medical decision making (see chart for details).   DDX: pid, torsion, cyst, uti, stone, cystitis  Mandy Hansen is a 27 y.o. who presents to the ED with symptoms as described above.  Abdominal exam shows some suprapubic tenderness.  She status post  appendectomy.  No upper abdominal pain.  Letter will  be sent for the above differential as well as will order ultrasound given her history of ovarian cyst and pelvic abnormality.  Will provide IV pain medication and reassess.  Clinical Course as of Mar 03 1051  Thu Mar 02, 2018  0958 Patient updated on results of ultrasound.  Patient states that she still having discomfort when urinating.  Will send urine off for culture.  Also discussed possibility of PID or STD.  Patient has declined pelvic exam but does request self swab for diagnostic testing would prefer to be empirically treated.  Have discussed with the patient and available family all diagnostics and treatments performed thus far and all questions were answered to the best of my ability. The patient demonstrates understanding and agreement with plan.    [PR]  1049 She reassessed.  Repeat abdominal exam soft and benign.  Patient no acute distress.  Stable and appropriate for outpatient follow-up.   [PR]    Clinical Course User Index [PR] Willy Eddy, MD     As part of my medical decision making, I reviewed the following data within the electronic MEDICAL RECORD NUMBER Nursing notes reviewed and incorporated, Labs reviewed, notes from prior ED visits and [place] Controlled Substance Database   ____________________________________________   FINAL CLINICAL IMPRESSION(S) / ED DIAGNOSES  Final diagnoses:  Pelvic pain  Suprapubic pain      NEW MEDICATIONS STARTED DURING THIS VISIT:  New Prescriptions   HYDROCODONE-ACETAMINOPHEN (NORCO) 5-325 MG TABLET    Take 1 tablet by mouth every 4 (four) hours as needed for moderate pain.   PHENAZOPYRIDINE (PYRIDIUM) 100 MG TABLET    Take 1 tablet (100 mg total) by mouth 3 (three) times daily as needed for pain.     Note:  This document was prepared using Dragon voice recognition software and may include unintentional dictation errors.    Willy Eddy, MD 03/02/18 1052

## 2018-03-03 LAB — URINE CULTURE: CULTURE: NO GROWTH

## 2018-03-27 ENCOUNTER — Emergency Department
Admission: EM | Admit: 2018-03-27 | Discharge: 2018-03-27 | Disposition: A | Discharge status: Home / Self Care | Payer: Medicaid Other | Attending: Emergency Medicine | Admitting: Emergency Medicine

## 2018-03-27 ENCOUNTER — Encounter: Payer: Self-pay | Admitting: Emergency Medicine

## 2018-03-27 ENCOUNTER — Emergency Department: Payer: Medicaid Other

## 2018-03-27 DIAGNOSIS — Z87891 Personal history of nicotine dependence: Secondary | ICD-10-CM | POA: Diagnosis not present

## 2018-03-27 DIAGNOSIS — Z79899 Other long term (current) drug therapy: Secondary | ICD-10-CM | POA: Insufficient documentation

## 2018-03-27 DIAGNOSIS — F419 Anxiety disorder, unspecified: Secondary | ICD-10-CM

## 2018-03-27 DIAGNOSIS — N760 Acute vaginitis: Secondary | ICD-10-CM | POA: Insufficient documentation

## 2018-03-27 DIAGNOSIS — B9689 Other specified bacterial agents as the cause of diseases classified elsewhere: Secondary | ICD-10-CM

## 2018-03-27 DIAGNOSIS — R079 Chest pain, unspecified: Secondary | ICD-10-CM

## 2018-03-27 LAB — COMPREHENSIVE METABOLIC PANEL
ALBUMIN: 4.1 g/dL (ref 3.5–5.0)
ALK PHOS: 57 U/L (ref 38–126)
ALT: 12 U/L (ref 0–44)
AST: 17 U/L (ref 15–41)
Anion gap: 10 (ref 5–15)
BUN: 10 mg/dL (ref 6–20)
CALCIUM: 9 mg/dL (ref 8.9–10.3)
CHLORIDE: 109 mmol/L (ref 98–111)
CO2: 23 mmol/L (ref 22–32)
CREATININE: 0.73 mg/dL (ref 0.44–1.00)
GFR calc Af Amer: >60 mL/min (ref >60)
GFR calc non Af Amer: >60 mL/min (ref >60)
GLUCOSE: 127 mg/dL — AB (ref 70–99)
Potassium: 3.6 mmol/L (ref 3.5–5.1)
SODIUM: 142 mmol/L (ref 135–145)
Total Bilirubin: 0.9 mg/dL (ref 0.3–1.2)
Total Protein: 6.8 g/dL (ref 6.5–8.1)

## 2018-03-27 LAB — CBC
HCT: 40.5 % (ref 35.0–47.0)
Hemoglobin: 14.1 g/dL (ref 12.0–16.0)
MCH: 32 pg (ref 26.0–34.0)
MCHC: 34.7 g/dL (ref 32.0–36.0)
MCV: 92.3 fL (ref 80.0–100.0)
PLATELETS: 238 10*3/uL (ref 150–440)
RBC: 4.39 MIL/uL (ref 3.80–5.20)
RDW: 12.8 % (ref 11.5–14.5)
WBC: 7.2 10*3/uL (ref 3.6–11.0)

## 2018-03-27 LAB — URINALYSIS, COMPLETE (UACMP) WITH MICROSCOPIC
Bilirubin Urine: NEGATIVE
GLUCOSE, UA: NEGATIVE mg/dL
Hgb urine dipstick: NEGATIVE
KETONES UR: 20 mg/dL — AB
Leukocytes, UA: NEGATIVE
Nitrite: NEGATIVE
PH: 5 (ref 5.0–8.0)
PROTEIN: 30 mg/dL — AB
Specific Gravity, Urine: 1.028 (ref 1.005–1.030)

## 2018-03-27 LAB — CHLAMYDIA/NGC RT PCR (ARMC ONLY)
Chlamydia Tr: NOT DETECTED
N gonorrhoeae: NOT DETECTED

## 2018-03-27 LAB — LIPASE, BLOOD: LIPASE: 27 U/L (ref 11–51)

## 2018-03-27 LAB — TROPONIN I: Troponin I: <0.03 ng/mL (ref <0.03)

## 2018-03-27 LAB — WET PREP, GENITAL
SPERM: NONE SEEN
TRICH WET PREP: NONE SEEN
YEAST WET PREP: NONE SEEN

## 2018-03-27 LAB — POCT PREGNANCY, URINE: PREG TEST UR: NEGATIVE

## 2018-03-27 MED ORDER — LORAZEPAM 1 MG PO TABS
1.0000 mg | ORAL_TABLET | Freq: Once | ORAL | Status: AC
  Administered 2018-03-27: 1 mg via ORAL
  Filled 2018-03-27: qty 1

## 2018-03-27 MED ORDER — ONDANSETRON 4 MG PO TBDP
4.0000 mg | ORAL_TABLET | Freq: Once | ORAL | Status: AC
  Administered 2018-03-27: 4 mg via ORAL

## 2018-03-27 MED ORDER — METRONIDAZOLE 500 MG PO TABS: 500.0000 mg | ORAL_TABLET | Freq: Three times a day (TID) | ORAL | 0 refills | Status: AC

## 2018-03-27 MED ORDER — ACETAMINOPHEN 500 MG PO TABS
1000.0000 mg | ORAL_TABLET | Freq: Once | ORAL | Status: AC
  Administered 2018-03-27: 1000 mg via ORAL

## 2018-03-27 MED ORDER — GI COCKTAIL ~~LOC~~
30.0000 mL | Freq: Once | ORAL | Status: AC
  Administered 2018-03-27: 30 mL via ORAL

## 2018-03-27 MED ORDER — ACETAMINOPHEN 500 MG PO TABS
ORAL_TABLET | ORAL | Status: AC
  Filled 2018-03-27: qty 2

## 2018-03-27 MED ORDER — GI COCKTAIL ~~LOC~~
ORAL | Status: AC
  Filled 2018-03-27: qty 30

## 2018-03-27 MED ORDER — ONDANSETRON 4 MG PO TBDP
ORAL_TABLET | ORAL | Status: AC
  Filled 2018-03-27: qty 1

## 2018-03-27 NOTE — ED Provider Notes (Signed)
[facility] Emergency Department Provider Note  ____________________________________________  Time seen: Approximately 7:26 PM  I have reviewed the triage vital signs and the nursing notes.   HISTORY  Chief Complaint Chest Pain and Abdominal Pain   HPI Mandy Hansen is a 27 y.o. female who presents for evaluation of chest pain.  Patient reports that she has been depressed and under a lot of stress recently.  She lost her job several weeks ago.  Tomorrow is her child's 6 birthday.  She has been unable to sleep over the last week and has been having night terrors.  She has been having episodes where she gets very anxious and hyperventilates and develops chest pain.  This episode today with similar patient is now complaining of tightness located across her chest which has been constant and nonradiating since earlier today.  She also feels short of breath with it.  She reports mild nausea associated with this.  No vomiting, no abdominal pain, no fever chills, no cough.  Patient denies personal history of depression.  She reports having panic attacks in the past.  She has not seen a psychiatrist.  She has family history of depression and panic attacks.  She denies SI or HI.  Patient is a smoker.  She denies personal or family history of coronary artery disease, blood clots, recent travel immobilization, leg pain or swelling, hemoptysis.  Patient is also complaining of heavy white vaginal discharge for the last week.  Past Medical History:  Diagnosis Date  . Dysplasia of cervix 2013   dyplasia has resolved since birth of son in 2013.  [name] Psoriasis of scalp 2004   mainly scalp, uses sun for treatment    Patient Active Problem List   Diagnosis Date Noted  . Epigastric pain 06/30/2015    Past Surgical History:  Procedure Laterality Date  . ABDOMINAL SURGERY    . APPENDECTOMY    . CESAREAN SECTION    . CESAREAN SECTION N/A 2013  . CYSTOSCOPY N/A 05/23/2015   Procedure: CYSTOSCOPY;  Surgeon: Nadara Mustard, MD;  Location: [facility] ORS;  Service: Gynecology;  Laterality: N/A;  . ESOPHAGOGASTRODUODENOSCOPY N/A 07/02/2015   Procedure: ESOPHAGOGASTRODUODENOSCOPY (EGD) looking in the esophagus stomach and upper small intestine with a lighted tube to evaluate and treat;  Surgeon: Christena Deem, MD;  Location: [facility] ENDOSCOPY;  Service: Endoscopy;  Laterality: N/A;  . INCISION AND DRAINAGE ABSCESS N/A 10/11/2017   Procedure: INCISION AND DRAINAGE SKENE CYST;  Surgeon: Natale Milch, MD;  Location: [facility] ORS;  Service: Gynecology;  Laterality: N/A;  . LAPAROSCOPY N/A 05/23/2015   Procedure: LAPAROSCOPY DIAGNOSTIC;  Surgeon: Nadara Mustard, MD;  Location: [facility] ORS;  Service: Gynecology;  Laterality: N/A;    Prior to Admission medications   Medication Sig Start Date End Date Taking? Authorizing Provider  Calcium Carb-Cholecalciferol (CALCIUM 600-D PO) Take 1 tablet by mouth daily at 12 noon.    [provider]  DULoxetine (CYMBALTA) 20 MG capsule Take 20 mg by mouth daily.    [provider]  ferrous sulfate 325 (65 FE) MG tablet Take 325 mg by mouth daily.    [provider]  gabapentin (NEURONTIN) 300 MG capsule TAKE 2 CAPSULES BY MOUTH TWICE DAILY 07/22/16   [provider]  HYDROcodone-acetaminophen (NORCO) 5-325 MG tablet Take 1 tablet by mouth every 4 (four) hours as needed for moderate pain. 03/02/18   Willy Eddy, MD  HYDROcodone-acetaminophen (NORCO/VICODIN) 5-325 MG tablet Take 1 tablet by  mouth every 6 (six) hours as needed for moderate pain. Patient not taking: Reported on 10/27/2017 10/19/17   Nadara Mustard, MD  hydrocortisone (ANUSOL-HC) 2.5 % rectal cream Apply rectally 2 times daily Patient not taking: Reported on 10/27/2017 10/19/17 10/19/18  Nadara Mustard, MD  ibuprofen (ADVIL,MOTRIN) 800 MG tablet Take 1 tablet (800 mg total) by mouth every 8 (eight) hours as needed. Patient not taking: Reported  on 10/13/2017 10/12/17   Natale Milch, MD  medroxyPROGESTERone (DEPO-PROVERA) 150 MG/ML injection Inject 1 mL (150 mg total) into the muscle every 3 (three) months. 10/27/17 02/28/18  Natale Milch, MD  Melatonin 5 MG TABS Take 5 mg by mouth at bedtime as needed (for sleep).    [provider]  metroNIDAZOLE (FLAGYL) 500 MG tablet Take 1 tablet (500 mg total) by mouth 3 (three) times daily for 7 days. 03/27/18 04/03/18  Nita Sickle, MD  ondansetron (ZOFRAN) 4 MG tablet Take 1 tablet (4 mg total) by mouth every 8 (eight) hours as needed for nausea or vomiting. Patient not taking: Reported on 10/27/2017 10/07/17   Jeanmarie Plant, MD  pantoprazole (PROTONIX) 40 MG tablet pantoprazole 40 mg tablet,delayed release  TAKE 1 TABLET BY MOUTH EVERY DAY 07/01/15   [provider]  phenazopyridine (PYRIDIUM) 100 MG tablet Take 1 tablet (100 mg total) by mouth 3 (three) times daily as needed for pain. 03/02/18 03/02/19  Willy Eddy, MD    Allergies Tramadol  Family History  Problem Relation Age of Onset  . Cancer Mother   . Cancer Maternal Aunt   . Cancer Maternal Grandmother     Social History Social History   Tobacco Use  . Smoking status: Former Smoker    Packs/day: 0.50    Types: Cigarettes  . Smokeless tobacco: Never Used  Substance Use Topics  . Alcohol use: No    Alcohol/week: 0.0 oz  . Drug use: Yes    Types: Marijuana    Comment: Once every 3 months    Review of Systems  Constitutional: Negative for fever. Eyes: Negative for visual changes. ENT: Negative for sore throat. Neck: No neck pain  Cardiovascular: + chest pain. Respiratory: + shortness of breath. Gastrointestinal: Negative for abdominal pain, vomiting or diarrhea. Genitourinary: Negative for dysuria. + vaginal discharge Musculoskeletal: Negative for back pain. Skin: Negative for rash. Neurological: Negative for headaches, weakness or numbness. Psych: No SI or HI, +  anxiety  ____________________________________________   PHYSICAL EXAM:  VITAL SIGNS: ED Triage Vitals  Enc Vitals Group     BP 03/27/18 1835 119/78     Pulse Rate 03/27/18 1835 85     Resp 03/27/18 1835 18     Temp 03/27/18 1835 98.7 F (37.1 C)     Temp Source 03/27/18 1835 Oral     SpO2 03/27/18 1835 100 %     Weight 03/27/18 1824 140 lb (63.5 kg)     Height 03/27/18 1824 5\' 3"  (1.6 m)     Head Circumference --      Peak Flow --      Pain Score 03/27/18 1824 7     Pain Loc --      Pain Edu? --      Excl. in GC? --     Constitutional: Alert and oriented, crying, no apparent distress. HEENT:      Head: Normocephalic and atraumatic.         Eyes: Conjunctivae are normal. Sclera is non-icteric.  Mouth/Throat: Mucous membranes are moist.       Neck: Supple with no signs of meningismus. Cardiovascular: Regular rate and rhythm. No murmurs, gallops, or rubs. 2+ symmetrical distal pulses are present in all extremities. No JVD. Respiratory: Normal respiratory effort. Lungs are clear to auscultation bilaterally. No wheezes, crackles, or rhonchi.  Gastrointestinal: Soft, non tender, and non distended with positive bowel sounds. No rebound or guarding. Genitourinary: No CVA tenderness. Pelvic exam: Normal external genitalia, no rashes or lesions. Watery white discharge. Os closed. No cervical motion tenderness.  No uterine or adnexal tenderness.   Musculoskeletal: Nontender with normal range of motion in all extremities. No edema, cyanosis, or erythema of extremities. Neurologic: Normal speech and language. Face is symmetric. Moving all extremities. No gross focal neurologic deficits are appreciated. Skin: Skin is warm, dry and intact. No rash noted. Psychiatric: Mood and affect are depressed. Speech and behavior are normal.  ____________________________________________   LABS (all labs ordered are listed, but only abnormal results are displayed)  Labs Reviewed  WET PREP,  GENITAL - Abnormal; Notable for the following components:      Result Value   Clue Cells Wet Prep HPF POC PRESENT (*)    WBC, Wet Prep HPF POC FEW (*)    All other components within normal limits  COMPREHENSIVE METABOLIC PANEL - Abnormal; Notable for the following components:   Glucose, Bld 127 (*)    All other components within normal limits  URINALYSIS, COMPLETE (UACMP) WITH MICROSCOPIC - Abnormal; Notable for the following components:   Color, Urine AMBER (*)    APPearance HAZY (*)    Ketones, ur 20 (*)    Protein, ur 30 (*)    Bacteria, UA RARE (*)    All other components within normal limits  CHLAMYDIA/NGC RT PCR ([facility] ONLY)  LIPASE, BLOOD  CBC  TROPONIN I  POC URINE PREG, ED  POCT PREGNANCY, URINE   ____________________________________________  EKG  ED ECG REPORT I, Nita Sicklearolina Keigan Girten, the attending physician, personally viewed and interpreted this ECG.  Normal sinus rhythm, rate of 81, normal intervals, normal axis, no ST elevations or depressions.  Unchanged from prior from 2016 ____________________________________________  RADIOLOGY  I have personally reviewed the images performed during this visit and I agree with the Radiologist's read.   Interpretation by Radiologist:  Dg Chest 2 View  Result Date: 03/27/2018 CLINICAL DATA:  Acute chest pain today. EXAM: CHEST - 2 VIEW COMPARISON:  02/25/2012 and prior exams FINDINGS: The cardiomediastinal silhouette is unremarkable. There is no evidence of focal airspace disease, pulmonary edema, suspicious pulmonary nodule/mass, pleural effusion, or pneumothorax. No acute bony abnormalities are identified. IMPRESSION: No active cardiopulmonary disease. Electronically Signed   By: Harmon PierJeffrey  Hu M.D.   On: 03/27/2018 18:53     ____________________________________________   PROCEDURES  Procedure(s) performed: None Procedures Critical Care performed:  None ____________________________________________   INITIAL  IMPRESSION / ASSESSMENT AND PLAN / ED COURSE  27 y.o. female who presents for evaluation of chest pain, anxiety, and vaginal discharge.  Patient very low risk for ACS with a heart score of 1 for smoking. EKG negative, troponin done 4 hours after onset of symptoms negative. At this time I believe the patient's symptoms are due to anxiety.  Patient is very anxious, crying, very depressed.  She does not have SI or HI.  She does not meet criteria for IVC.  Will provide with a milligram of Ativan.  Chest x-ray is negative for pneumonia or pneumothorax.  Labs are all  within normal limits.  She is also complaining of vaginal discharge therefore a pelvic exam was done.  She has a watery white discharge.  Wet prep, GC and Chlamydia are pending.    _________________________ 9:37 PM on 03/27/2018 -----------------------------------------  Swabs positive for BV.  Patient was started on Flagyl.  Patient is going to be referred to [facility] for help.  Discussed return precautions for any signs of worsening depression, suicidal homicidal ideation, new or worsening chest pain, syncope.  Otherwise patient to follow-up with her primary care doctor   As part of my medical decision making, I reviewed the following data within the electronic MEDICAL RECORD NUMBER History obtained from family, Nursing notes reviewed and incorporated, Labs reviewed , EKG interpreted , Old EKG reviewed, Old chart reviewed, Radiograph reviewed , Notes from prior ED visits and [place] Controlled Substance Database    Pertinent labs & imaging results that were available during my care of the patient were reviewed by me and considered in my medical decision making (see chart for details).    ____________________________________________   FINAL CLINICAL IMPRESSION(S) / ED DIAGNOSES  Final diagnoses:  Chest pain, unspecified type  Anxiety  BV (bacterial vaginosis)      NEW MEDICATIONS STARTED DURING THIS VISIT:  ED Discharge Orders         Ordered    metroNIDAZOLE (FLAGYL) 500 MG tablet  3 times daily     03/27/18 2136       Note:  This document was prepared using Dragon voice recognition software and may include unintentional dictation errors.    Nita Sickle, MD 03/27/18 2137

## 2018-03-27 NOTE — ED Notes (Signed)
Pt reports her s/x's may be related to "anxiety or a panic attack", but would not give more details. Pt states the pain has been constant since onset, and not like her "normal panic attacks".

## 2018-03-27 NOTE — ED Triage Notes (Signed)
Pt reports chest pain and mid epigastric pain intermittently all day, worse now. Pt reports some nausea with it.

## 2018-03-27 NOTE — Discharge Instructions (Addendum)

## 2018-04-19 NOTE — Telephone Encounter (Signed)
Pt was notified.  

## 2018-09-03 ENCOUNTER — Emergency Department: Payer: Medicaid Other

## 2018-09-03 ENCOUNTER — Emergency Department
Admission: EM | Admit: 2018-09-03 | Discharge: 2018-09-03 | Disposition: A | Discharge status: Home / Self Care | Payer: Medicaid Other | Attending: Emergency Medicine | Admitting: Emergency Medicine

## 2018-09-03 ENCOUNTER — Other Ambulatory Visit: Payer: Self-pay

## 2018-09-03 ENCOUNTER — Encounter: Payer: Self-pay | Admitting: Emergency Medicine

## 2018-09-03 DIAGNOSIS — L03211 Cellulitis of face: Secondary | ICD-10-CM | POA: Insufficient documentation

## 2018-09-03 DIAGNOSIS — J0100 Acute maxillary sinusitis, unspecified: Secondary | ICD-10-CM | POA: Diagnosis not present

## 2018-09-03 DIAGNOSIS — Z79899 Other long term (current) drug therapy: Secondary | ICD-10-CM | POA: Insufficient documentation

## 2018-09-03 DIAGNOSIS — Z87891 Personal history of nicotine dependence: Secondary | ICD-10-CM | POA: Insufficient documentation

## 2018-09-03 DIAGNOSIS — H1030 Unspecified acute conjunctivitis, unspecified eye: Secondary | ICD-10-CM | POA: Diagnosis not present

## 2018-09-03 DIAGNOSIS — H5713 Ocular pain, bilateral: Secondary | ICD-10-CM | POA: Diagnosis present

## 2018-09-03 LAB — BASIC METABOLIC PANEL
Anion gap: 7 (ref 5–15)
BUN: 12 mg/dL (ref 6–20)
CO2: 26 mmol/L (ref 22–32)
Calcium: 8.9 mg/dL (ref 8.9–10.3)
Chloride: 109 mmol/L (ref 98–111)
Creatinine, Ser: 0.71 mg/dL (ref 0.44–1.00)
GFR calc Af Amer: >60 mL/min (ref >60)
GLUCOSE: 87 mg/dL (ref 70–99)
POTASSIUM: 3.9 mmol/L (ref 3.5–5.1)
Sodium: 142 mmol/L (ref 135–145)

## 2018-09-03 LAB — CBC WITH DIFFERENTIAL/PLATELET
ABS IMMATURE GRANULOCYTES: 0.04 10*3/uL (ref 0.00–0.07)
Basophils Absolute: 0.1 10*3/uL (ref 0.0–0.1)
Basophils Relative: 1 %
Eosinophils Absolute: 0.2 10*3/uL (ref 0.0–0.5)
Eosinophils Relative: 2 %
HCT: 38.9 % (ref 36.0–46.0)
HEMOGLOBIN: 12.7 g/dL (ref 12.0–15.0)
Immature Granulocytes: 0 %
LYMPHS PCT: 21 %
Lymphs Abs: 2 10*3/uL (ref 0.7–4.0)
MCH: 31.5 pg (ref 26.0–34.0)
MCHC: 32.6 g/dL (ref 30.0–36.0)
MCV: 96.5 fL (ref 80.0–100.0)
MONOS PCT: 7 %
Monocytes Absolute: 0.7 10*3/uL (ref 0.1–1.0)
NEUTROS ABS: 6.5 10*3/uL (ref 1.7–7.7)
Neutrophils Relative %: 69 %
PLATELETS: 260 10*3/uL (ref 150–400)
RBC: 4.03 MIL/uL (ref 3.87–5.11)
RDW: 12.3 % (ref 11.5–15.5)
WBC: 9.4 10*3/uL (ref 4.0–10.5)
nRBC: 0 % (ref 0.0–0.2)

## 2018-09-03 MED ORDER — IOHEXOL 300 MG/ML  SOLN
75.0000 mL | Freq: Once | INTRAMUSCULAR | Status: AC | PRN
Start: 2018-09-03 — End: 2018-09-03
  Administered 2018-09-03: 75 mL via INTRAVENOUS
  Filled 2018-09-03: qty 75

## 2018-09-03 MED ORDER — TOBRAMYCIN 0.3 % OP SOLN: 2.0000 [drp] | OPHTHALMIC | 0 refills | Status: DC

## 2018-09-03 MED ORDER — AMOXICILLIN 875 MG PO TABS: 875.0000 mg | ORAL_TABLET | Freq: Two times a day (BID) | ORAL | 0 refills | Status: DC

## 2018-09-03 NOTE — ED Provider Notes (Signed)
[facility] Emergency Department Provider Note  ____________________________________________   First MD Initiated Contact with Patient 09/03/18 1023     (approximate)  I have reviewed the triage vital signs and the nursing notes.   HISTORY  Chief Complaint Eye Pain    HPI Mandy Hansen is a 27 y.o. female presents emergency department complaint bilateral eye pain.  States hurts to move her eyes.  She has had matting and drainage noted over the past 2 days.  Face is also turned red around her eyes.  She states she has had a low-grade fever.  She denies any cough/congestion, chest pain shortness of breath.    Past Medical History:  Diagnosis Date  . Dysplasia of cervix 2013   dyplasia has resolved since birth of son in 2013.  [name] Psoriasis of scalp 2004   mainly scalp, uses sun for treatment    Patient Active Problem List   Diagnosis Date Noted  . Epigastric pain 06/30/2015    Past Surgical History:  Procedure Laterality Date  . ABDOMINAL SURGERY    . APPENDECTOMY    . CESAREAN SECTION    . CESAREAN SECTION N/A 2013  . CYSTOSCOPY N/A 05/23/2015   Procedure: CYSTOSCOPY;  Surgeon: Gae Dry, MD;  Location: [facility] ORS;  Service: Gynecology;  Laterality: N/A;  . ESOPHAGOGASTRODUODENOSCOPY N/A 07/02/2015   Procedure: ESOPHAGOGASTRODUODENOSCOPY (EGD) looking in the esophagus stomach and upper small intestine with a lighted tube to evaluate and treat;  Surgeon: Lollie Sails, MD;  Location: [facility] ENDOSCOPY;  Service: Endoscopy;  Laterality: N/A;  . INCISION AND DRAINAGE ABSCESS N/A 10/11/2017   Procedure: INCISION AND DRAINAGE SKENE CYST;  Surgeon: Homero Fellers, MD;  Location: [facility] ORS;  Service: Gynecology;  Laterality: N/A;  . LAPAROSCOPY N/A 05/23/2015   Procedure: LAPAROSCOPY DIAGNOSTIC;  Surgeon: Gae Dry, MD;  Location: [facility] ORS;  Service: Gynecology;  Laterality: N/A;    Prior to Admission medications   Medication Sig Start  Date End Date Taking? Authorizing Provider  amoxicillin (AMOXIL) 875 MG tablet Take 1 tablet (875 mg total) by mouth 2 (two) times daily. 09/03/18   Zianne Schubring, Linden Dolin, PA-C  Calcium Carb-Cholecalciferol (CALCIUM 600-D PO) Take 1 tablet by mouth daily at 12 noon.    [provider]  DULoxetine (CYMBALTA) 20 MG capsule Take 20 mg by mouth daily.    [provider]  ferrous sulfate 325 (65 FE) MG tablet Take 325 mg by mouth daily.    [provider]  gabapentin (NEURONTIN) 300 MG capsule TAKE 2 CAPSULES BY MOUTH TWICE DAILY 07/22/16   [provider]  HYDROcodone-acetaminophen (NORCO) 5-325 MG tablet Take 1 tablet by mouth every 4 (four) hours as needed for moderate pain. 03/02/18   Merlyn Lot, MD  HYDROcodone-acetaminophen (NORCO/VICODIN) 5-325 MG tablet Take 1 tablet by mouth every 6 (six) hours as needed for moderate pain. Patient not taking: Reported on 10/27/2017 10/19/17   Gae Dry, MD  hydrocortisone (ANUSOL-HC) 2.5 % rectal cream Apply rectally 2 times daily Patient not taking: Reported on 10/27/2017 10/19/17 10/19/18  Gae Dry, MD  ibuprofen (ADVIL,MOTRIN) 800 MG tablet Take 1 tablet (800 mg total) by mouth every 8 (eight) hours as needed. Patient not taking: Reported on 10/13/2017 10/12/17   Homero Fellers, MD  medroxyPROGESTERone (DEPO-PROVERA) 150 MG/ML injection Inject 1 mL (150 mg total) into the muscle every 3 (three) months. 10/27/17 02/28/18  Homero Fellers, MD  Melatonin 5 MG TABS Take  5 mg by mouth at bedtime as needed (for sleep).    [provider]  ondansetron (ZOFRAN) 4 MG tablet Take 1 tablet (4 mg total) by mouth every 8 (eight) hours as needed for nausea or vomiting. Patient not taking: Reported on 10/27/2017 10/07/17   Schuyler Amor, MD  pantoprazole (PROTONIX) 40 MG tablet pantoprazole 40 mg tablet,delayed release  TAKE 1 TABLET BY MOUTH EVERY DAY 07/01/15   [provider]  phenazopyridine  (PYRIDIUM) 100 MG tablet Take 1 tablet (100 mg total) by mouth 3 (three) times daily as needed for pain. 03/02/18 03/02/19  Merlyn Lot, MD  tobramycin (TOBREX) 0.3 % ophthalmic solution Place 2 drops into both eyes every 4 (four) hours. 09/03/18   Versie Starks, PA-C    Allergies Tramadol  Family History  Problem Relation Age of Onset  . Cancer Mother   . Cancer Maternal Aunt   . Cancer Maternal Grandmother     Social History Social History   Tobacco Use  . Smoking status: Former Smoker    Packs/day: 0.50    Types: Cigarettes  . Smokeless tobacco: Never Used  Substance Use Topics  . Alcohol use: No    Alcohol/week: 0.0 standard drinks  . Drug use: Yes    Types: Marijuana    Comment: Once every 3 months    Review of Systems  Constitutional: Positive fever/chills Eyes: Positive visual changes.  Positive for matting of the eyes ENT: No sore throat. Respiratory: Denies cough Genitourinary: Negative for dysuria. Musculoskeletal: Negative for back pain. Skin: Negative for rash.    ____________________________________________   PHYSICAL EXAM:  VITAL SIGNS: ED Triage Vitals  Enc Vitals Group     BP 09/03/18 0946 136/84     Pulse Rate 09/03/18 0946 90     Resp 09/03/18 0946 15     Temp 09/03/18 0946 (!) 100.9 F (38.3 C)     Temp Source 09/03/18 0946 Oral     SpO2 09/03/18 0946 97 %     Weight 09/03/18 0947 140 lb (63.5 kg)     Height 09/03/18 0947 5' 3"  (1.6 m)     Head Circumference --      Peak Flow --      Pain Score 09/03/18 0949 7     Pain Loc --      Pain Edu? --      Excl. in [place]? --     Constitutional: Alert and oriented. Well appearing and in no acute distress. Eyes: Conjunctivae are normal.  Positive for redness in the periorbital area.  Extraocular motions reproduce pain. Head: Atraumatic. Nose: No congestion/rhinnorhea. Mouth/Throat: Mucous membranes are moist.   Neck:  supple no lymphadenopathy noted Cardiovascular: Normal rate, regular  rhythm. Heart sounds are normal Respiratory: Normal respiratory effort.  No retractions, lungs c t a  GU: deferred Musculoskeletal: FROM all extremities, warm and well perfused Neurologic:  Normal speech and language.  Skin:  Skin is warm, dry and intact. No rash noted. Psychiatric: Mood and affect are normal. Speech and behavior are normal.  ____________________________________________   LABS (all labs ordered are listed, but only abnormal results are displayed)  Labs Reviewed  CBC WITH DIFFERENTIAL/PLATELET  BASIC METABOLIC PANEL   ____________________________________________   ____________________________________________  RADIOLOGY  CT of the orbits are negative for orbital cellulitis  ____________________________________________   PROCEDURES  Procedure(s) performed: No  Procedures    ____________________________________________   INITIAL IMPRESSION / ASSESSMENT AND PLAN / ED COURSE  Pertinent labs &  imaging results that were available during my care of the patient were reviewed by me and considered in my medical decision making (see chart for details).   Patient is 27 year old female presents emergency department complaining of bilateral eye pain, swelling, redness, and drainage.  Physical exam shows periorbital edema with redness.  Extraocular motions reproduce pain.  CBC and met B are normal. CT of the orbits is negative for periorbital cellulitis.  Explained the findings to the patient.  She was given a prescription for amoxicillin.  She is to use tobramycin ophthalmic drops for the conjunctivitis.  Follow-up with her regular doctor if not better in 3 days.  Return emergency department worsening.  States she understands was discharged in stable condition.     As part of my medical decision making, I reviewed the following data within the [place] notes reviewed and incorporated, Labs reviewed CBC, met B are negative, Old chart  reviewed, Radiograph reviewed CT orbits is negative, Notes from prior ED visits and [place] Controlled Substance Database  ____________________________________________   FINAL CLINICAL IMPRESSION(S) / ED DIAGNOSES  Final diagnoses:  Facial cellulitis  Acute bacterial conjunctivitis, unspecified laterality  Acute maxillary sinusitis, recurrence not specified      NEW MEDICATIONS STARTED DURING THIS VISIT:  New Prescriptions   AMOXICILLIN (AMOXIL) 875 MG TABLET    Take 1 tablet (875 mg total) by mouth 2 (two) times daily.   TOBRAMYCIN (TOBREX) 0.3 % OPHTHALMIC SOLUTION    Place 2 drops into both eyes every 4 (four) hours.     Note:  This document was prepared using Dragon voice recognition software and may include unintentional dictation errors.    Versie Starks, PA-C 09/03/18 1155    Harvest Dark, MD 09/03/18 [phone]

## 2018-09-03 NOTE — ED Notes (Signed)
Stated everything is burred.

## 2018-09-03 NOTE — Discharge Instructions (Signed)
Follow-up with your regular doctor if not better in 3 days.  Return to the emergency department worsening.  Take the medication as prescribed.

## 2018-09-03 NOTE — ED Triage Notes (Signed)
Pt to ED via POV c/o bilateral eye pain. Pt states that the pain started on Thursday night in her right eye and now is hurting in both eyes. Pt is in NAD at this time.

## 2019-02-28 ENCOUNTER — Other Ambulatory Visit: Payer: Self-pay

## 2019-02-28 ENCOUNTER — Encounter: Payer: Self-pay | Admitting: Emergency Medicine

## 2019-02-28 ENCOUNTER — Emergency Department: Payer: Medicaid Other

## 2019-02-28 ENCOUNTER — Emergency Department
Admission: EM | Admit: 2019-02-28 | Discharge: 2019-02-28 | Disposition: A | Discharge status: Home / Self Care | Payer: Medicaid Other | Attending: Emergency Medicine | Admitting: Emergency Medicine

## 2019-02-28 DIAGNOSIS — Z87891 Personal history of nicotine dependence: Secondary | ICD-10-CM | POA: Diagnosis not present

## 2019-02-28 DIAGNOSIS — S62653A Nondisplaced fracture of medial phalanx of left middle finger, initial encounter for closed fracture: Secondary | ICD-10-CM | POA: Insufficient documentation

## 2019-02-28 DIAGNOSIS — Y929 Unspecified place or not applicable: Secondary | ICD-10-CM | POA: Insufficient documentation

## 2019-02-28 DIAGNOSIS — Y9389 Activity, other specified: Secondary | ICD-10-CM | POA: Diagnosis not present

## 2019-02-28 DIAGNOSIS — X500XXA Overexertion from strenuous movement or load, initial encounter: Secondary | ICD-10-CM | POA: Insufficient documentation

## 2019-02-28 DIAGNOSIS — Z79899 Other long term (current) drug therapy: Secondary | ICD-10-CM | POA: Diagnosis not present

## 2019-02-28 DIAGNOSIS — Y999 Unspecified external cause status: Secondary | ICD-10-CM | POA: Insufficient documentation

## 2019-02-28 DIAGNOSIS — S6992XA Unspecified injury of left wrist, hand and finger(s), initial encounter: Secondary | ICD-10-CM | POA: Diagnosis present

## 2019-02-28 NOTE — Discharge Instructions (Addendum)
Follow-up with Dr. Odis Luster who is on-call for orthopedics if any problems or concerns.  This should heal without any difficulties.  Ice and elevation.  Wear splint for protection.  The general length of time for broken bone is 4 to 6 weeks.  If you are having any concerns or need to have it rechecked prior to taking the splint off and working with your left hand you may make an appointment with Dr. Odis Luster.  Tylenol or ibuprofen if needed for pain.  You may also take the splint off while you are taking a shower, but then replace it.

## 2019-02-28 NOTE — ED Provider Notes (Signed)
[facility] Emergency Department Provider Note  ____________________________________________   First MD Initiated Contact with Patient 02/28/19 [phone]     (approximate)  I have reviewed the triage vital signs and the nursing notes.   HISTORY  Chief Complaint Hand Pain   HPI Mandy Hansen is a 28 y.o. female presents to the ED with complaint of left middle finger pain.  Patient states that she jumped onto a rope yesterday and it slipped.  She states that her finger was dislocated and that someone present pulled her finger back into place.  She is continued to have pain with increased swelling today.  She denies any other injuries.  She rates her pain as 7 out of 10.       Past Medical History:  Diagnosis Date  . Dysplasia of cervix 2013   dyplasia has resolved since birth of son in 2013.  [name]. Psoriasis of scalp 2004   mainly scalp, uses sun for treatment    Patient Active Problem List   Diagnosis Date Noted  . Epigastric pain 06/30/2015    Past Surgical History:  Procedure Laterality Date  . ABDOMINAL SURGERY    . APPENDECTOMY    . CESAREAN SECTION    . CESAREAN SECTION N/A 2013  . CYSTOSCOPY N/A 05/23/2015   Procedure: CYSTOSCOPY;  Surgeon: Nadara Mustardobert P Harris, MD;  Location: [facility] ORS;  Service: Gynecology;  Laterality: N/A;  . ESOPHAGOGASTRODUODENOSCOPY N/A 07/02/2015   Procedure: ESOPHAGOGASTRODUODENOSCOPY (EGD) looking in the esophagus stomach and upper small intestine with a lighted tube to evaluate and treat;  Surgeon: Christena DeemMartin U Skulskie, MD;  [facility] END[facility]COPY;  Service: Endoscopy;  Laterality: N/A;  . INCISION AND DRAINAGE ABSCESS N/A 10/11/2017   Procedure: INCISION AND DRAINAGE SKENE CYST;  Surgeon: [name], Christanna R, MD;  Location: [facility] ORS;  Service: Gynecology;  Laterality: N/A;  . LAPAROSCOPY N/A 05/23/2015   Procedure: LAPAROSCOPY DIAGNOSTIC;  Surgeon: Nadara Mustardobert P Harris, MD;  Location: [facility] ORS;  Service: Gynecology;  Laterality:  N/A;    Prior to Admission medications   Medication Sig Start Date End Date Taking? Authorizing Provider  ferrous sulfate 325 (65 FE) MG tablet Take 325 mg by mouth daily.    [provider]  medroxyPROGESTERone (DEPO-PROVERA) 150 MG/ML injection Inject 1 mL (150 mg total) into the muscle every 3 (three) months. 10/27/17 02/28/18  [name], Christanna R, MD  Melatonin 5 MG TABS Take 5 mg by mouth at bedtime as needed (for sleep).    [provider]  pantoprazole (PROTONIX) 40 MG tablet pantoprazole 40 mg tablet,delayed release  TAKE 1 TABLET BY MOUTH EVERY DAY 07/01/15   [provider]    Allergies Tramadol  Family History  Problem Relation Age of Onset  . Cancer Mother   . Cancer Maternal Aunt   . Cancer Maternal Grandmother     Social History Social History   Tobacco Use  . Smoking status: Former Smoker    Packs/day: 0.50    Types: Cigarettes  . Smokeless tobacco: Never Used  Substance Use Topics  . Alcohol use: No    Alcohol/week: 0.0 standard drinks  . Drug use: Yes    Types: Marijuana    Comment: Once every 3 months    Review of Systems Constitutional: No fever/chills Cardiovascular: Denies chest pain. Respiratory: Denies shortness of breath. Musculoskeletal: Pain left third finger. Skin: Negative for rash. Neurological: Negative for headaches, focal weakness or numbness. ____________________________________________   PHYSICAL EXAM:  VITAL SIGNS: ED Triage  Vitals  Enc Vitals Group     BP 02/28/19 0935 130/72     Pulse Rate 02/28/19 0935 89     Resp 02/28/19 0935 20     Temp 02/28/19 0935 98.2 F (36.8 C)     Temp Source 02/28/19 0935 Oral     SpO2 02/28/19 0935 97 %     Weight 02/28/19 0935 175 lb (79.4 kg)     Height 02/28/19 0935  (1.6 m)     Head Circumference --      Peak Flow --      Pain Score 02/28/19 0931 7     Pain Loc --      Pain Edu? --      Excl. in [place]? --    Constitutional: Alert and oriented. Well  appearing and in no acute distress. Eyes: Conjunctivae are normal.  Head: Atraumatic. Neck: No stridor.   Cardiovascular: Normal rate, regular rhythm. Grossly normal heart sounds.  Good peripheral circulation. Respiratory: Normal respiratory effort.  No retractions. Lungs CTAB. Musculoskeletal: On examination of the left third finger there is moderate edema and ecchymosis noted to the distal phalanx with decreased range of motion.  No injury to the nail.  There is moderate tenderness on palpation at the DIP joint and middle phalanx.  Skin is intact.  Capillary refills less than 3 seconds.  Motor sensory function intact.  Patient is limited with flexion and extension secondary to pain. Neurologic:  Normal speech and language. No gross focal neurologic deficits are appreciated. No gait instability. Skin:  Skin is warm, dry and intact.  Psychiatric: Mood and affect are normal. Speech and behavior are normal.  ____________________________________________   LABS (all labs ordered are listed, but only abnormal results are displayed)  Labs Reviewed - No data to display  RADIOLOGY  ED MD interpretation:   Left middle finger shows a nondisplaced fracture of the distal portion of the middle phalanx.  Official radiology report(s): Dg Finger Middle Left  Result Date: 02/28/2019 CLINICAL DATA:  Left middle finger injury. EXAM: LEFT MIDDLE FINGER 2+V COMPARISON:  None. FINDINGS: Probable minimally displaced fracture is seen involving the distal portion of the middle phalanx of the third finger. No definite dislocation is noted. No definite soft tissue abnormality is noted. IMPRESSION: Probable minimally displaced fracture involving distal portion of third middle phalanx. Electronically Signed   By: Lupita Raider M.D.   On: 02/28/2019 10:08    ____________________________________________   PROCEDURES  Procedure(s) performed (including Critical Care):  Procedures  Metal finger splint was  applied to patient's third finger. ____________________________________________   INITIAL IMPRESSION / ASSESSMENT AND PLAN / ED COURSE  As part of my medical decision making, I reviewed the following data within the electronic MEDICAL RECORD NUMBER Notes from prior ED visits and [place] Controlled Substance Database  28 year old female presents to the ED with injury to her left third finger that occurred yesterday.  She states that it was dislocated and "popped" back into place but she has continued to have pain and increased swelling today.  X-ray did confirm that she had a nondisplaced fracture of her distal middle phalanx.  Metal splint was applied.  Patient was encouraged to ice and elevate.  She is to follow-up with Dr. Odis Luster.  Tylenol or ibuprofen as needed for pain.   ____________________________________________   FINAL CLINICAL IMPRESSION(S) / ED DIAGNOSES  Final diagnoses:  Closed nondisplaced fracture of middle phalanx of left middle finger, initial encounter  ED Discharge Orders    None       Note:  This document was prepared using Dragon voice recognition software and may include unintentional dictation errors.    Tommi Rumps, PA-C 02/28/19 1426    Shaune Pollack, MD 02/28/19 2137

## 2019-02-28 NOTE — ED Triage Notes (Signed)
Pt presents to ED via POV with c/o L 3rd digit pain. Pt states dislocated it yesterday when it got caught in a rope and had it put back into placed by s/o yesterday and splinted, today woke up with swelling and pain that was worse. Pt states pain radiates up into L forearm.

## 2019-02-28 NOTE — ED Notes (Signed)
See triage note  Presents with injury to left middle finger  States she jump on to a roped a it slipped  States her finger was dislocated yesterday but able able to reduce it  Now finger is swollen and bruised

## 2019-03-08 ENCOUNTER — Emergency Department
Admission: EM | Admit: 2019-03-08 | Discharge: 2019-03-08 | Disposition: A | Discharge status: Home / Self Care | Payer: Medicaid Other | Attending: Emergency Medicine | Admitting: Emergency Medicine

## 2019-03-08 ENCOUNTER — Encounter: Payer: Self-pay | Admitting: Emergency Medicine

## 2019-03-08 ENCOUNTER — Emergency Department: Payer: Medicaid Other

## 2019-03-08 ENCOUNTER — Other Ambulatory Visit: Payer: Self-pay

## 2019-03-08 DIAGNOSIS — Z87891 Personal history of nicotine dependence: Secondary | ICD-10-CM | POA: Diagnosis not present

## 2019-03-08 DIAGNOSIS — R2243 Localized swelling, mass and lump, lower limb, bilateral: Secondary | ICD-10-CM | POA: Diagnosis present

## 2019-03-08 DIAGNOSIS — Z79899 Other long term (current) drug therapy: Secondary | ICD-10-CM | POA: Insufficient documentation

## 2019-03-08 DIAGNOSIS — R609 Edema, unspecified: Secondary | ICD-10-CM

## 2019-03-08 DIAGNOSIS — R600 Localized edema: Secondary | ICD-10-CM

## 2019-03-08 LAB — CBC WITH DIFFERENTIAL/PLATELET
Abs Immature Granulocytes: 0.04 10*3/uL (ref 0.00–0.07)
Basophils Absolute: 0 10*3/uL (ref 0.0–0.1)
Basophils Relative: 0 %
Eosinophils Absolute: 0.3 10*3/uL (ref 0.0–0.5)
Eosinophils Relative: 2 %
HCT: 42.5 % (ref 36.0–46.0)
Hemoglobin: 13.8 g/dL (ref 12.0–15.0)
Immature Granulocytes: 0 %
Lymphocytes Relative: 28 %
Lymphs Abs: 3.1 10*3/uL (ref 0.7–4.0)
MCH: 31.5 pg (ref 26.0–34.0)
MCHC: 32.5 g/dL (ref 30.0–36.0)
MCV: 97 fL (ref 80.0–100.0)
Monocytes Absolute: 0.8 10*3/uL (ref 0.1–1.0)
Monocytes Relative: 7 %
Neutro Abs: 6.8 10*3/uL (ref 1.7–7.7)
Neutrophils Relative %: 63 %
Platelets: 248 10*3/uL (ref 150–400)
RBC: 4.38 MIL/uL (ref 3.87–5.11)
RDW: 12.6 % (ref 11.5–15.5)
WBC: 11 10*3/uL — ABNORMAL HIGH (ref 4.0–10.5)
nRBC: 0 % (ref 0.0–0.2)

## 2019-03-08 LAB — COMPREHENSIVE METABOLIC PANEL
ALT: 15 U/L (ref 0–44)
AST: 20 U/L (ref 15–41)
Albumin: 4.2 g/dL (ref 3.5–5.0)
Alkaline Phosphatase: 61 U/L (ref 38–126)
Anion gap: 3 — ABNORMAL LOW (ref 5–15)
BUN: 10 mg/dL (ref 6–20)
CO2: 27 mmol/L (ref 22–32)
Calcium: 8.8 mg/dL — ABNORMAL LOW (ref 8.9–10.3)
Chloride: 105 mmol/L (ref 98–111)
Creatinine, Ser: 0.75 mg/dL (ref 0.44–1.00)
GFR calc Af Amer: >60 mL/min (ref >60)
GFR calc non Af Amer: >60 mL/min (ref >60)
Glucose, Bld: 86 mg/dL (ref 70–99)
Potassium: 3.6 mmol/L (ref 3.5–5.1)
Sodium: 135 mmol/L (ref 135–145)
Total Bilirubin: 0.3 mg/dL (ref 0.3–1.2)
Total Protein: 7 g/dL (ref 6.5–8.1)

## 2019-03-08 NOTE — ED Provider Notes (Signed)
[facility] Emergency Department Provider Note ____________________________________________  Time seen: Approximately 11:14 PM  I have reviewed the triage vital signs and the nursing notes.   HISTORY  Chief Complaint Leg Swelling    HPI Mandy Hansen is a 28 y.o. female who presents to the emergency department for evaluation and treatment of bilateral lower extremity swelling.  She has noticed that this has been present for the past week or so.  She is never had lower extremity swelling before.  She denies any recent change in her diet.  She does state that she stands for long periods of time while at work, but this is never happened before.  No alleviating measures attempted.  Past Medical History:  Diagnosis Date  . Dysplasia of cervix 2013   dyplasia has resolved since birth of son in 2013.  [name] Psoriasis of scalp 2004   mainly scalp, uses sun for treatment    Patient Active Problem List   Diagnosis Date Noted  . Epigastric pain 06/30/2015    Past Surgical History:  Procedure Laterality Date  . ABDOMINAL SURGERY    . APPENDECTOMY    . CESAREAN SECTION    . CESAREAN SECTION N/A 2013  . CYSTOSCOPY N/A 05/23/2015   Procedure: CYSTOSCOPY;  Surgeon: Gae Dry, MD;  Location: [facility] ORS;  Service: Gynecology;  Laterality: N/A;  . ESOPHAGOGASTRODUODENOSCOPY N/A 07/02/2015   Procedure: ESOPHAGOGASTRODUODENOSCOPY (EGD) looking in the esophagus stomach and upper small intestine with a lighted tube to evaluate and treat;  Surgeon: Lollie Sails, MD;  Location: [facility] ENDOSCOPY;  Service: Endoscopy;  Laterality: N/A;  . INCISION AND DRAINAGE ABSCESS N/A 10/11/2017   Procedure: INCISION AND DRAINAGE SKENE CYST;  Surgeon: Homero Fellers, MD;  Location: [facility] ORS;  Service: Gynecology;  Laterality: N/A;  . LAPAROSCOPY N/A 05/23/2015   Procedure: LAPAROSCOPY DIAGNOSTIC;  Surgeon: Gae Dry, MD;  Location: [facility] ORS;  Service: Gynecology;  Laterality:  N/A;    Prior to Admission medications   Medication Sig Start Date End Date Taking? Authorizing Provider  ferrous sulfate 325 (65 FE) MG tablet Take 325 mg by mouth daily.    [provider]  medroxyPROGESTERone (DEPO-PROVERA) 150 MG/ML injection Inject 1 mL (150 mg total) into the muscle every 3 (three) months. 10/27/17 02/28/18  Homero Fellers, MD  Melatonin 5 MG TABS Take 5 mg by mouth at bedtime as needed (for sleep).    [provider]  pantoprazole (PROTONIX) 40 MG tablet pantoprazole 40 mg tablet,delayed release  TAKE 1 TABLET BY MOUTH EVERY DAY 07/01/15   [provider]    Allergies Tramadol  Family History  Problem Relation Age of Onset  . Cancer Mother   . Cancer Maternal Aunt   . Cancer Maternal Grandmother     Social History Social History   Tobacco Use  . Smoking status: Former Smoker    Packs/day: 0.50    Types: Cigarettes  . Smokeless tobacco: Never Used  Substance Use Topics  . Alcohol use: No    Alcohol/week: 0.0 standard drinks  . Drug use: Yes    Types: Marijuana    Comment: Once every 3 months    Review of Systems Constitutional: Negative for fever. Cardiovascular: Negative for chest pain. Respiratory: Negative for shortness of breath. Musculoskeletal: Positive for bilateral lower extremity pain. Skin: Negative for open wound or lesion Neurological: Negative for decrease in sensation  ____________________________________________   PHYSICAL EXAM:  VITAL SIGNS: ED Triage Vitals [03/08/19 2008]  Enc Vitals Group     BP 124/79     Pulse Rate 76     Resp 17     Temp 98.2 F (36.8 C)     Temp Source Oral     SpO2 97 %     Weight      Height      Head Circumference      Peak Flow      Pain Score      Pain Loc      Pain Edu?      Excl. in [place]?     Constitutional: Alert and oriented. Well appearing and in no acute distress. Eyes: Conjunctivae are clear without discharge or drainage Head:  Atraumatic Neck: Supple Respiratory: No cough. Respirations are even and unlabored. Musculoskeletal: Full range of motion of the lower extremities. Neurologic: Motor and sensory function is intact. Skin: Nonpitting peripheral edema present bilateral lower extremities below the knee. Psychiatric: Affect and behavior are appropriate.  ____________________________________________   LABS (all labs ordered are listed, but only abnormal results are displayed)  Labs Reviewed  COMPREHENSIVE METABOLIC PANEL - Abnormal; Notable for the following components:      Result Value   Calcium 8.8 (*)    Anion gap 3 (*)    All other components within normal limits  CBC WITH DIFFERENTIAL/PLATELET - Abnormal; Notable for the following components:   WBC 11.0 (*)    All other components within normal limits   ____________________________________________  RADIOLOGY  Ultrasound of bilateral lower extremities negative for DVT or other findings of concern. ____________________________________________   PROCEDURES  Procedures  ____________________________________________   INITIAL IMPRESSION / ASSESSMENT AND PLAN / ED COURSE  Mandy Hansen is a 28 y.o. who presents to the emergency department for evaluation of bilateral lower extremity swelling.  Ultrasound and labs are reassuring.  Patient was encouraged to purchase compression stockings and wear them when she is going to be standing for long periods of time.  She was also encouraged to decrease the amount of salt that she is consuming.  She is to call her primary care provider on Monday and schedule follow-up appointment.  She was advised to return to the emergency department if she begins to experience any chest pain or shortness of breath.  Medications - No data to display  Pertinent labs & imaging results that were available during my care of the patient were reviewed by me and considered in my medical decision making (see chart for  details).  _________________________________________   FINAL CLINICAL IMPRESSION(S) / ED DIAGNOSES  Final diagnoses:  Peripheral edema    ED Discharge Orders    None       If controlled substance prescribed during this visit, 12 month history viewed on the [facility] prior to issuing an initial prescription for Schedule II or III opiod.   Chinita Pesterriplett, Khaya Theissen B, FNP 03/08/19 2317    Jeanmarie PlantMcShane, James A, MD 03/08/19 [phone]

## 2019-03-08 NOTE — ED Triage Notes (Signed)
Pt c/o numbness, swelling, burning/shooting pain in the right lower extremity x1 week. Pt denies injury. Denies medical hx.

## 2019-03-08 NOTE — Discharge Instructions (Signed)
Please purchase compression stockings at the pharmacy and wear them when you are out of bed.  Limit the salt in your foods, keeping in mind that salt is also found in soft drinks.  Call and schedule an appointment with Dr. Rebeca Alert.  Return to the emergency department if you begin to have any shortness of breath or other symptoms of concern.

## 2019-07-02 ENCOUNTER — Telehealth: Payer: Self-pay | Admitting: Obstetrics and Gynecology

## 2019-07-02 NOTE — Telephone Encounter (Signed)
Scheduled 10/14 with RPH.

## 2019-07-02 NOTE — Telephone Encounter (Signed)
Mandy Hansen Referring for Dysplasia of Cervix unspecified, repeat pap smear. Called and left voicemail for patient to call back to be schedule

## 2019-07-11 ENCOUNTER — Ambulatory Visit (INDEPENDENT_AMBULATORY_CARE_PROVIDER_SITE_OTHER): Payer: Medicaid Other | Admitting: Obstetrics & Gynecology

## 2019-07-11 ENCOUNTER — Other Ambulatory Visit (HOSPITAL_COMMUNITY)
Admission: RE | Admit: 2019-07-11 | Discharge: 2019-07-11 | Disposition: A | Discharge status: Home / Self Care | Payer: Medicaid Other | Source: Ambulatory Visit | Attending: Obstetrics & Gynecology | Admitting: Obstetrics & Gynecology

## 2019-07-11 ENCOUNTER — Encounter: Payer: Self-pay | Admitting: Obstetrics & Gynecology

## 2019-07-11 ENCOUNTER — Other Ambulatory Visit: Payer: Self-pay

## 2019-07-11 VITALS — BP 120/80 | Ht 63.0 in | Wt 160.0 lb

## 2019-07-11 DIAGNOSIS — R87612 Low grade squamous intraepithelial lesion on cytologic smear of cervix (LGSIL): Secondary | ICD-10-CM | POA: Diagnosis not present

## 2019-07-11 DIAGNOSIS — Z124 Encounter for screening for malignant neoplasm of cervix: Secondary | ICD-10-CM | POA: Insufficient documentation

## 2019-07-11 DIAGNOSIS — Z0000 Encounter for general adult medical examination without abnormal findings: Secondary | ICD-10-CM

## 2019-07-11 NOTE — Progress Notes (Signed)
Consultant: Dr Rebeca Alert Reason: Cervical Dysplasia  HPI:      [name] is a 28 y.o. G1P1001 who LMP was No LMP recorded. Patient has had an injection., she presents today for her consultation related to longstanding Cervical Dysplasia. The patient has no complaints today. The patient is sexually active. Her last pap: approximate date 2018 and was LGSIL. She has had abnormal PAPs since teenager, and has had multiple colposcopies and biopsies, never had LEEP although it was advised on a couple of occasions.  The patient does perform self breast exams.  There is no notable family history of breast or ovarian cancer in her family.  The patient has regular exercise: yes.  The patient denies current symptoms of depression.    GYN History: Contraception: Depo-Provera injections  PMHx: Past Medical History:  Diagnosis Date  . Dysplasia of cervix 2013   dyplasia has resolved since birth of son in 2013.  [name] Psoriasis of scalp 2004   mainly scalp, uses sun for treatment   Past Surgical History:  Procedure Laterality Date  . ABDOMINAL SURGERY    . APPENDECTOMY    . CESAREAN SECTION    . CESAREAN SECTION N/A 2013  . CYSTOSCOPY N/A 05/23/2015   Procedure: CYSTOSCOPY;  Surgeon: Gae Dry, MD;  Location: [facility] ORS;  Service: Gynecology;  Laterality: N/A;  . ESOPHAGOGASTRODUODENOSCOPY N/A 07/02/2015   Procedure: ESOPHAGOGASTRODUODENOSCOPY (EGD) looking in the esophagus stomach and upper small intestine with a lighted tube to evaluate and treat;  Surgeon: Lollie Sails, MD;  Location: [facility] ENDOSCOPY;  Service: Endoscopy;  Laterality: N/A;  . INCISION AND DRAINAGE ABSCESS N/A 10/11/2017   Procedure: INCISION AND DRAINAGE SKENE CYST;  Surgeon: Homero Fellers, MD;  Location: [facility] ORS;  Service: Gynecology;  Laterality: N/A;  . LAPAROSCOPY N/A 05/23/2015   Procedure: LAPAROSCOPY DIAGNOSTIC;  Surgeon: Gae Dry, MD;  Location: [facility] ORS;  Service: Gynecology;  Laterality: N/A;    Family History  Problem Relation Age of Onset  . Cancer Mother   . Cancer Maternal Aunt   . Cancer Maternal Grandmother    Social History   Tobacco Use  . Smoking status: Former Smoker    Packs/day: 0.50    Types: Cigarettes  . Smokeless tobacco: Never Used  Substance Use Topics  . Alcohol use: No    Alcohol/week: 0.0 standard drinks  . Drug use: Yes    Types: Marijuana    Comment: Once every 3 months    Current Outpatient Medications:  .  ferrous sulfate 325 (65 FE) MG tablet, Take 325 mg by mouth daily., Disp: , Rfl:  .  Melatonin 5 MG TABS, Take 5 mg by mouth at bedtime as needed (for sleep)., Disp: , Rfl:  .  pantoprazole (PROTONIX) 40 MG tablet, pantoprazole 40 mg tablet,delayed release  TAKE 1 TABLET BY MOUTH EVERY DAY, Disp: , Rfl:  .  medroxyPROGESTERone (DEPO-PROVERA) 150 MG/ML injection, Inject 1 mL (150 mg total) into the muscle every 3 (three) months., Disp: 1 mL, Rfl: 4 Allergies: Tramadol  Review of Systems  Constitutional: Negative for chills, fever and malaise/fatigue.  HENT: Negative for congestion, sinus pain and sore throat.   Eyes: Negative for blurred vision and pain.  Respiratory: Negative for cough and wheezing.   Cardiovascular: Negative for chest pain and leg swelling.  Gastrointestinal: Negative for abdominal pain, constipation, diarrhea, heartburn, nausea and vomiting.  Genitourinary: Negative for dysuria, frequency, hematuria and urgency.  Musculoskeletal: Negative for back pain, joint pain,  myalgias and neck pain.  Skin: Negative for itching and rash.  Neurological: Negative for dizziness, tremors and weakness.  Endo/Heme/Allergies: Does not bruise/bleed easily.  Psychiatric/Behavioral: Negative for depression. The patient is not nervous/anxious and does not have insomnia.     Objective: BP 120/80   Ht 5\' 3"  (1.6 m)   Wt 160 lb (72.6 kg)   BMI 28.34 kg/m   Filed Weights   07/11/19 1341  Weight: 160 lb (72.6 kg)   Body mass index is  28.34 kg/m. Physical Exam Constitutional:      General: She is not in acute distress.    Appearance: She is well-developed.  Genitourinary:     Pelvic exam was performed with patient supine.     Vagina, uterus and rectum normal.     No lesions in the vagina.     No vaginal bleeding.     No cervical motion tenderness, friability, lesion or polyp.     Uterus is mobile.     Uterus is not enlarged.     No uterine mass detected.    Uterus is midaxial.     No right or left adnexal mass present.     Right adnexa not tender.     Left adnexa not tender.  HENT:     Head: Normocephalic and atraumatic. No laceration.     Right Ear: Hearing normal.     Left Ear: Hearing normal.     Mouth/Throat:     Pharynx: Uvula midline.  Eyes:     Pupils: Pupils are equal, round, and reactive to light.  Neck:     Musculoskeletal: Normal range of motion and neck supple.     Thyroid: No thyromegaly.  Cardiovascular:     Rate and Rhythm: Normal rate and regular rhythm.     Heart sounds: No murmur. No friction rub. No gallop.   Pulmonary:     Effort: Pulmonary effort is normal. No respiratory distress.     Breath sounds: Normal breath sounds. No wheezing.  Chest:     Breasts:        Right: No mass, skin change or tenderness.        Left: No mass, skin change or tenderness.  Abdominal:     General: Bowel sounds are normal. There is no distension.     Palpations: Abdomen is soft.     Tenderness: There is no abdominal tenderness. There is no rebound.  Musculoskeletal: Normal range of motion.  Neurological:     Mental Status: She is alert and oriented to person, place, and time.     Cranial Nerves: No cranial nerve deficit.  Skin:    General: Skin is warm and dry.  Psychiatric:        Judgment: Judgment normal.  Vitals signs reviewed.     Assessment:  Primary Concern: Cervical Dysplasia History, never resolved            1.  Cervical Screening-  Pap smear done today  2. Breast screening-  Exam annually and mammogram>40 planned   3. Labs managed by PCP  4. Counseling for contraception: Depo-Provera   5. LGSIL on Pap smear of cervix If still LGSIL, consider cryo for treatment and help w resolving persistently abnormal PAPs for many years.  If HGSIL this time, then will need LEEP.  If normal, repeat in 6 months    F/U  Return in about 6 months (around 01/09/2020) for Follow up.  Annamarie MajorPaul Dianne Bady, MD, [facility], [facility] 07/11/2019  2:18 PM

## 2019-07-13 LAB — CYTOLOGY - PAP: Diagnosis: NEGATIVE

## 2019-08-02 ENCOUNTER — Emergency Department
Admission: EM | Admit: 2019-08-02 | Discharge: 2019-08-02 | Disposition: A | Discharge status: Home / Self Care | Payer: Medicaid Other | Attending: Emergency Medicine | Admitting: Emergency Medicine

## 2019-08-02 ENCOUNTER — Emergency Department: Payer: Medicaid Other

## 2019-08-02 ENCOUNTER — Other Ambulatory Visit: Payer: Self-pay

## 2019-08-02 ENCOUNTER — Encounter: Payer: Self-pay | Admitting: Emergency Medicine

## 2019-08-02 DIAGNOSIS — S20211A Contusion of right front wall of thorax, initial encounter: Secondary | ICD-10-CM

## 2019-08-02 DIAGNOSIS — W010XXA Fall on same level from slipping, tripping and stumbling without subsequent striking against object, initial encounter: Secondary | ICD-10-CM | POA: Insufficient documentation

## 2019-08-02 DIAGNOSIS — S9032XA Contusion of left foot, initial encounter: Secondary | ICD-10-CM

## 2019-08-02 DIAGNOSIS — Y92013 Bedroom of single-family (private) house as the place of occurrence of the external cause: Secondary | ICD-10-CM | POA: Diagnosis not present

## 2019-08-02 DIAGNOSIS — S29001A Unspecified injury of muscle and tendon of front wall of thorax, initial encounter: Secondary | ICD-10-CM | POA: Diagnosis present

## 2019-08-02 DIAGNOSIS — W19XXXA Unspecified fall, initial encounter: Secondary | ICD-10-CM

## 2019-08-02 DIAGNOSIS — Y92009 Unspecified place in unspecified non-institutional (private) residence as the place of occurrence of the external cause: Secondary | ICD-10-CM

## 2019-08-02 DIAGNOSIS — Z87891 Personal history of nicotine dependence: Secondary | ICD-10-CM | POA: Insufficient documentation

## 2019-08-02 DIAGNOSIS — Y999 Unspecified external cause status: Secondary | ICD-10-CM | POA: Diagnosis not present

## 2019-08-02 DIAGNOSIS — Z79899 Other long term (current) drug therapy: Secondary | ICD-10-CM | POA: Diagnosis not present

## 2019-08-02 DIAGNOSIS — Y9301 Activity, walking, marching and hiking: Secondary | ICD-10-CM | POA: Insufficient documentation

## 2019-08-02 MED ORDER — HYDROCODONE-ACETAMINOPHEN 5-325 MG PO TABS: 1.0000 | ORAL_TABLET | Freq: Four times a day (QID) | ORAL | 0 refills | Status: DC | PRN

## 2019-08-02 NOTE — Discharge Instructions (Signed)
Follow-up with your primary care provider if any continued problems.  Begin taking Norco as needed for moderate to severe pain.  Do not drive or operate machinery while taking this medication.  You may also take ibuprofen with this medication for inflammation and pain.  Use ice to your foot and elevate as needed for swelling and pain.  Your ribs will probably hurt for 2 to 4 weeks.  Decrease smoking during this time.  Also hold a pillow against your ribs for added support if you cough or take a deep breath.

## 2019-08-02 NOTE — ED Notes (Signed)
See triage note  States she got in in the dark  Tripped on baby bed    Having pain to left foot,mainly the toes  And also having pain to right rib area  Pain increases with inspiration

## 2019-08-02 NOTE — ED Triage Notes (Signed)
Pt reports this am she got up in the dark and tripped and fell. Pt reports when she did her elbow went into her right side ribs. Pt c/o pain to left foot toes and right side ribs. Denies LOC.

## 2019-08-02 NOTE — ED Provider Notes (Signed)
[facility] Emergency Department Provider Note  ____________________________________________   First MD Initiated Contact with Patient 08/02/19 0813     (approximate)  I have reviewed the triage vital signs and the nursing notes.   HISTORY  Chief Complaint Fall, Toe Pain, and Rib Injury   HPI [name] is a 28 y.o. female presents to the ED with complaint of right rib pain and left foot pain.  Patient states that she got up in the dark this morning and tripped over a baby bed.  She states that her elbow hit her right ribs and she is continued to have rib pain since.  Patient also has left foot pain especially her third toe.  Patient denies any head injury or loss of consciousness during her fall.  Her pain as 6 out of 10.     Past Medical History:  Diagnosis Date  . Dysplasia of cervix 2013   dyplasia has resolved since birth of son in 2013.  [name]. Psoriasis of scalp 2004   mainly scalp, uses sun for treatment    Patient Active Problem List   Diagnosis Date Noted  . 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 Epigastric pain 06/30/2015    Past Surgical History:  Procedure Laterality Date  . ABDOMINAL SURGERY    . APPENDECTOMY    . CESAREAN SECTION    . CESAREAN SECTION N/A 2013  . CYSTOSCOPY N/A 05/23/2015   Procedure: CYSTOSCOPY;  Surgeon: Nadara Mustardobert P Harris, MD;  Location: [facility] ORS;  Service: Gynecology;  Laterality: N/A;  . ESOPHAGOGASTRODUODENOSCOPY N/A 07/02/2015   Procedure: ESOPHAGOGASTRODUODENOSCOPY (EGD) looking in the esophagus stomach and upper small intestine with a lighted tube to evaluate and treat;  Surgeon: Christena DeemMartin U Skulskie, MD;  [facility];  Service: Endoscopy;  Laterality: N/A;  . INCISION AND DRAINAGE ABSCESS N/A 10/11/2017   Procedure: INCISION AND DRAINAGE SKENE CYST;  Surgeon: [name], Christanna R, MD;  Location: [facility] ORS;  Service: Gynecology;  Laterality: N/A;  . LAPAROSCOPY N/A 05/23/2015   Procedure:  LAPAROSCOPY DIAGNOSTIC;  Surgeon: Nadara Mustardobert P Harris, MD;  Location: [facility] ORS;  Service: Gynecology;  Laterality: N/A;    Prior to Admission medications   Medication Sig Start Date End Date Taking? Authorizing Provider  ferrous sulfate 325 (65 FE) MG tablet Take 325 mg by mouth daily.    [provider]  HYDROcodone-acetaminophen (NORCO/VICODIN) 5-325 MG tablet Take 1 tablet by mouth every 6 (six) hours as needed for moderate pain. 08/02/19   Tommi RumpsSummers, Alexa Golebiewski L, PA-C  medroxyPROGESTERone (DEPO-PROVERA) 150 MG/ML injection Inject 1 mL (150 mg total) into the muscle every 3 (three) months. 10/27/17 02/28/18  [name], Christanna R, MD  Melatonin 5 MG TABS Take 5 mg by mouth at bedtime as needed (for sleep).    [provider]  pantoprazole (PROTONIX) 40 MG tablet pantoprazole 40 mg tablet,delayed release  TAKE 1 TABLET BY MOUTH EVERY DAY 07/01/15   [provider]    Allergies Tramadol  Family History  Problem Relation Age of Onset  . Cancer Mother   . Cancer Maternal Aunt   . Cancer Maternal Grandmother     Social History Social History   Tobacco Use  . Smoking status: Former Smoker    Packs/day: 0.50    Types: Cigarettes  . Smokeless tobacco: Never Used  Substance Use Topics  . Alcohol use: No    Alcohol/week: 0.0 standard drinks  . Drug use: Yes    Types: Marijuana  Comment: Once every 3 months    Review of Systems Constitutional: No fever/chills Cardiovascular: Denies chest pain. Respiratory: Denies shortness of breath. Gastrointestinal: No abdominal pain.  No nausea, no vomiting. Musculoskeletal: Positive for right rib pain and left foot pain. Skin: Negative for rash. Neurological: Negative for headaches, focal weakness or numbness. ___________________________________________   PHYSICAL EXAM:  VITAL SIGNS: ED Triage Vitals [08/02/19 0809]  Enc Vitals Group     BP 137/83     Pulse Rate 90     Resp 20     Temp (!) 97.5 F (36.4 C)      Temp Source Oral     SpO2 100 %     Weight 165 lb (74.8 kg)     Height 5\' 3"  (1.6 m)     Head Circumference      Peak Flow      Pain Score 6     Pain Loc      Pain Edu?      Excl. in GC?    Constitutional: Alert and oriented. Well appearing and in no acute distress. Eyes: Conjunctivae are normal. PERRL. EOMI. Head: Atraumatic. Nose: No trauma. Neck: No stridor.  No cervical tenderness on palpation posteriorly. Cardiovascular: Normal rate, regular rhythm. Grossly normal heart sounds.  Good peripheral circulation. Respiratory: Normal respiratory effort.  No retractions. Lungs are clear bilaterally.  Right lateral ribs are markedly tender to palpation.  No gross deformity or discoloration is noted. Gastrointestinal: Soft and nontender. No distention.  Musculoskeletal: Patient is able move upper extremities without any difficulty.  No hip or knee pain is palpated.  There is tenderness on palpation of the left foot and especially the left second and third toe.  Skin is intact but the third toe is slightly ecchymotic.  Capillary refill is less than 3 seconds.  Motor sensory function intact.  No point tenderness on palpation of thoracic or lumbar spine. Neurologic:  Normal speech and language. No gross focal neurologic deficits are appreciated. No gait instability. Skin:  Skin is warm, dry and intact. No rash noted. Psychiatric: Mood and affect are normal. Speech and behavior are normal.  ____________________________________________   LABS (all labs ordered are listed, but only abnormal results are displayed)  Labs Reviewed - No data to display  RADIOLOGY  Official radiology report(s): Dg Ribs Unilateral W/chest Right  Result Date: 08/02/2019 CLINICAL DATA:  Fall today. EXAM: RIGHT RIBS AND CHEST - 3+ VIEW COMPARISON:  03/27/2018 FINDINGS: No fracture or other bone lesions are seen involving the ribs. There is no evidence of pneumothorax or pleural effusion. Both lungs are clear. Heart  size and mediastinal contours are within normal limits. IMPRESSION: Negative. Electronically Signed   By: 05/28/2018 M.D.   On: 08/02/2019 09:12   Dg Foot Complete Left  Result Date: 08/02/2019 CLINICAL DATA:  Fall today.  Metatarsal pain. EXAM: LEFT FOOT - COMPLETE 3+ VIEW COMPARISON:  None. FINDINGS: There is no evidence of fracture or dislocation. There is no evidence of arthropathy or other focal bone abnormality. Soft tissues are unremarkable. IMPRESSION: Negative. Electronically Signed   By: 13/01/2019 M.D.   On: 08/02/2019 09:11    ____________________________________________   PROCEDURES  Procedure(s) performed (including Critical Care):  Procedures   ____________________________________________   INITIAL IMPRESSION / ASSESSMENT AND PLAN / ED COURSE  As part of my medical decision making, I reviewed the following data within the electronic MEDICAL RECORD NUMBER Notes from prior ED visits and [place] Controlled Substance Database  28 year old female presents to the ED after falling in the dark when she tripped over a baby bed this morning.  There was no head injury or loss of consciousness.  Patient complained of rib pain and left foot pain.  Physical exam was concerning for a left foot fracture however x-rays of her ribs and foot were negative for any acute bony injury.  Patient was made aware.  She is encouraged to use ice and elevate her foot as needed.  Patient was given a prescription for Norco 1 every 6 hours as needed for pain.  Patient was also given a note to remain out of work.   ____________________________________________   FINAL CLINICAL IMPRESSION(S) / ED DIAGNOSES  Final diagnoses:  Rib contusion, right, initial encounter  Contusion of left foot, initial encounter  Fall in home, initial encounter     ED Discharge Orders         Ordered    HYDROcodone-acetaminophen (NORCO/VICODIN) 5-325 MG tablet  Every 6 hours PRN     08/02/19 0941           Note:   This document was prepared using Dragon voice recognition software and may include unintentional dictation errors.    Johnn Hai, PA-C 08/02/19 1622    Lavonia Drafts, MD 08/02/19 [phone]

## 2019-09-13 ENCOUNTER — Other Ambulatory Visit: Payer: Self-pay

## 2019-09-13 ENCOUNTER — Encounter: Payer: Self-pay | Admitting: Emergency Medicine

## 2019-09-13 ENCOUNTER — Emergency Department
Admission: EM | Admit: 2019-09-13 | Discharge: 2019-09-13 | Disposition: A | Discharge status: Home / Self Care | Payer: Medicaid Other | Attending: Emergency Medicine | Admitting: Emergency Medicine

## 2019-09-13 ENCOUNTER — Emergency Department: Payer: Medicaid Other

## 2019-09-13 DIAGNOSIS — Z87891 Personal history of nicotine dependence: Secondary | ICD-10-CM | POA: Diagnosis not present

## 2019-09-13 DIAGNOSIS — U071 COVID-19: Secondary | ICD-10-CM | POA: Insufficient documentation

## 2019-09-13 DIAGNOSIS — Z79899 Other long term (current) drug therapy: Secondary | ICD-10-CM | POA: Insufficient documentation

## 2019-09-13 DIAGNOSIS — R05 Cough: Secondary | ICD-10-CM | POA: Diagnosis present

## 2019-09-13 LAB — CBC
HCT: 42.9 % (ref 36.0–46.0)
Hemoglobin: 14.1 g/dL (ref 12.0–15.0)
MCH: 30.5 pg (ref 26.0–34.0)
MCHC: 32.9 g/dL (ref 30.0–36.0)
MCV: 92.7 fL (ref 80.0–100.0)
Platelets: 203 10*3/uL (ref 150–400)
RBC: 4.63 MIL/uL (ref 3.87–5.11)
RDW: 12 % (ref 11.5–15.5)
WBC: 6.3 10*3/uL (ref 4.0–10.5)
nRBC: 0 % (ref 0.0–0.2)

## 2019-09-13 LAB — BASIC METABOLIC PANEL
Anion gap: 11 (ref 5–15)
BUN: 11 mg/dL (ref 6–20)
CO2: 22 mmol/L (ref 22–32)
Calcium: 9.2 mg/dL (ref 8.9–10.3)
Chloride: 107 mmol/L (ref 98–111)
Creatinine, Ser: 0.61 mg/dL (ref 0.44–1.00)
GFR calc Af Amer: >60 mL/min (ref >60)
GFR calc non Af Amer: >60 mL/min (ref >60)
Glucose, Bld: 110 mg/dL — ABNORMAL HIGH (ref 70–99)
Potassium: 4.3 mmol/L (ref 3.5–5.1)
Sodium: 140 mmol/L (ref 135–145)

## 2019-09-13 LAB — POC SARS CORONAVIRUS 2 AG: SARS Coronavirus 2 Ag: POSITIVE — AB

## 2019-09-13 LAB — TROPONIN I (HIGH SENSITIVITY): Troponin I (High Sensitivity): <2 ng/L (ref <18)

## 2019-09-13 NOTE — ED Provider Notes (Addendum)
[facility] Emergency Department Provider Note   ____________________________________________    I have reviewed the triage vital signs and the nursing notes.   HISTORY  Chief Complaint Chest Pain, Shortness of Breath, and Cough     HPI Mandy Hansen is a 28 y.o. female who presents with 1 to 2 days of cough, mild shortness of breath, myalgias, chills and some chest discomfort.  She attributes this to the heat going out in her home 2 days ago and she woke up very cold.  Has not checked her temperature, does not know if she has been exposed to coronavirus or not.  Denies nausea or vomiting.  Has not taken anything for this.  Past Medical History:  Diagnosis Date  . Dysplasia of cervix 2013   dyplasia has resolved since birth of son in 2013.  [name] Psoriasis of scalp 2004   mainly scalp, uses sun for treatment    Patient Active Problem List   Diagnosis Date Noted  . 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 Epigastric pain 06/30/2015    Past Surgical History:  Procedure Laterality Date  . ABDOMINAL SURGERY    . APPENDECTOMY    . CESAREAN SECTION    . CESAREAN SECTION N/A 2013  . CYSTOSCOPY N/A 05/23/2015   Procedure: CYSTOSCOPY;  Surgeon: Gae Dry, MD;  Location: [facility] ORS;  Service: Gynecology;  Laterality: N/A;  . ESOPHAGOGASTRODUODENOSCOPY N/A 07/02/2015   Procedure: ESOPHAGOGASTRODUODENOSCOPY (EGD) looking in the esophagus stomach and upper small intestine with a lighted tube to evaluate and treat;  Surgeon: Lollie Sails, MD;  Location: [facility] ENDOSCOPY;  Service: Endoscopy;  Laterality: N/A;  . INCISION AND DRAINAGE ABSCESS N/A 10/11/2017   Procedure: INCISION AND DRAINAGE SKENE CYST;  Surgeon: Homero Fellers, MD;  Location: [facility] ORS;  Service: Gynecology;  Laterality: N/A;  . LAPAROSCOPY N/A 05/23/2015   Procedure: LAPAROSCOPY DIAGNOSTIC;  Surgeon: Gae Dry, MD;  Location: [facility] ORS;  Service: Gynecology;  Laterality:  N/A;    Prior to Admission medications   Medication Sig Start Date End Date Taking? Authorizing Provider  ferrous sulfate 325 (65 FE) MG tablet Take 325 mg by mouth daily.    [provider]  HYDROcodone-acetaminophen (NORCO/VICODIN) 5-325 MG tablet Take 1 tablet by mouth every 6 (six) hours as needed for moderate pain. 08/02/19   Johnn Hai, PA-C  medroxyPROGESTERone (DEPO-PROVERA) 150 MG/ML injection Inject 1 mL (150 mg total) into the muscle every 3 (three) months. 10/27/17 02/28/18  Homero Fellers, MD  Melatonin 5 MG TABS Take 5 mg by mouth at bedtime as needed (for sleep).    [provider]  pantoprazole (PROTONIX) 40 MG tablet pantoprazole 40 mg tablet,delayed release  TAKE 1 TABLET BY MOUTH EVERY DAY 07/01/15   [provider]     Allergies Tramadol  Family History  Problem Relation Age of Onset  . Cancer Mother   . Cancer Maternal Aunt   . Cancer Maternal Grandmother     Social History Social History   Tobacco Use  . Smoking status: Former Smoker    Packs/day: 0.50    Types: Cigarettes  . Smokeless tobacco: Never Used  Substance Use Topics  . Alcohol use: No    Alcohol/week: 0.0 standard drinks  . Drug use: Yes    Types: Marijuana    Comment: Once every 3 months    Review of Systems  Constitutional: As above Eyes: No visual changes.  ENT:  No sore throat. Cardiovascular: As above Respiratory: As above Gastrointestinal: No abdominal pain.  No nausea, no vomiting.   Genitourinary: Negative for dysuria. Musculoskeletal: Positive myalgias Skin: Negative for rash. Neurological: Positive headache   ____________________________________________   PHYSICAL EXAM:  VITAL SIGNS: ED Triage Vitals  Enc Vitals Group     BP 09/13/19 0758 139/74     Pulse Rate 09/13/19 0758 67     Resp 09/13/19 0758 18     Temp 09/13/19 0758 98.4 F (36.9 C)     Temp Source 09/13/19 0758 Oral     SpO2 09/13/19 0758 99 %     Weight 09/13/19  0745 77.1 kg (170 lb)     Height 09/13/19 0745 1.6 m (5\' 3" )     Head Circumference --      Peak Flow --      Pain Score 09/13/19 0745 7     Pain Loc --      Pain Edu? --      Excl. in GC? --     Constitutional: Alert and oriented.  Eyes: Conjunctivae are normal.   Nose: No congestion/rhinnorhea. Mouth/Throat: Mucous membranes are moist.    Cardiovascular: Normal rate, regular rhythm. Grossly normal heart sounds.  Good peripheral circulation. Respiratory: Normal respiratory effort.  No retractions.  No wheezing or rales Gastrointestinal: Soft and nontender. No distention.  No CVA tenderness.  Musculoskeletal: No lower extremity tenderness nor edema.  Warm and well perfused Neurologic: . No gross focal neurologic deficits are appreciated.  Skin:  Skin is warm, dry and intact. No rash noted. Psychiatric: Mood and affect are normal. Speech and behavior are normal.  ____________________________________________   LABS (all labs ordered are listed, but only abnormal results are displayed)  Labs Reviewed  BASIC METABOLIC PANEL - Abnormal; Notable for the following components:      Result Value   Glucose, Bld 110 (*)    All other components within normal limits  POC SARS CORONAVIRUS 2 AG - Abnormal; Notable for the following components:   SARS Coronavirus 2 Ag POSITIVE (*)    All other components within normal limits  CBC  POC SARS CORONAVIRUS 2 AG -  ED  TROPONIN I (HIGH SENSITIVITY)   ____________________________________________  EKG  ED ECG REPORT I, 09/15/19, the attending physician, personally viewed and interpreted this ECG.  Date: 10/01/2019  Rhythm: normal sinus rhythm QRS Axis: normal Intervals: normal ST/T Wave abnormalities: Nonspecific changes Narrative Interpretation: no evidence of acute ischemia  ____________________________________________  RADIOLOGY  Chest x-ray  unremarkable ____________________________________________   PROCEDURES  Procedure(s) performed: No  Procedures   Critical Care performed: No ____________________________________________   INITIAL IMPRESSION / ASSESSMENT AND PLAN / ED COURSE  Pertinent labs & imaging results that were available during my care of the patient were reviewed by me and considered in my medical decision making (see chart for details).  Patient presents with viral symptoms as noted above suspicious for COVID-19 infection.  Chest x-ray is reassuring, oxygen levels are normal.  Lab work is unremarkable.  Rapid POC positive.  Recommend supportive care, home quarantine, contact and droplet precautions, appropriate for discharge at this time    ____________________________________________   FINAL CLINICAL IMPRESSION(S) / ED DIAGNOSES  Final diagnoses:  COVID-19        Note:  This document was prepared using Dragon voice recognition software and may include unintentional dictation errors.   11/29/2019, MD 09/13/19 1154    09/15/19, MD 10/01/19 [phone]

## 2019-09-13 NOTE — ED Notes (Signed)
Pt presents c/o cough, congestion, chest pain since Tuesday.  Denies fevers.  Reports worse when sitting, feel better laying flat.

## 2019-09-13 NOTE — Telephone Encounter (Signed)
Keep as Karen Chafe

## 2019-09-13 NOTE — Telephone Encounter (Signed)
Please advise 

## 2019-09-13 NOTE — ED Triage Notes (Signed)
Pt reports started Tuesday am with sharp CP to her ribs. Pt also reports cough and congestion but denies fevers.

## 2019-09-13 NOTE — ED Notes (Signed)
Dr Corky Downs to bedside for evaluation

## 2019-09-19 NOTE — Telephone Encounter (Signed)
Not sure if you can give any advice, she positive for covid, so we cant see her, but she has  tele appointment tomorrow

## 2019-09-20 ENCOUNTER — Encounter: Payer: Self-pay | Admitting: Obstetrics & Gynecology

## 2019-09-20 ENCOUNTER — Other Ambulatory Visit: Payer: Self-pay

## 2019-09-20 ENCOUNTER — Ambulatory Visit (INDEPENDENT_AMBULATORY_CARE_PROVIDER_SITE_OTHER): Payer: Medicaid Other | Admitting: Obstetrics & Gynecology

## 2019-09-20 VITALS — Ht 63.0 in | Wt 157.0 lb

## 2019-09-20 DIAGNOSIS — N340 Urethral abscess: Secondary | ICD-10-CM

## 2019-09-20 MED ORDER — MELOXICAM 7.5 MG PO TABS: 7.5000 mg | ORAL_TABLET | Freq: Two times a day (BID) | ORAL | 1 refills | Status: DC | PRN

## 2019-09-20 MED ORDER — CEPHALEXIN 500 MG PO CAPS: 500.0000 mg | ORAL_CAPSULE | Freq: Four times a day (QID) | ORAL | 0 refills | Status: AC

## 2019-09-20 NOTE — Progress Notes (Signed)
Virtual Visit via Telephone Note  I connected with Mandy Hansen on 09/20/19 at  8:00 AM EST by telephone and verified that I am speaking with the correct person using two identifiers.   I discussed the limitations, risks, security and privacy concerns of performing an evaluation and management service by telephone and the availability of in person appointments. I also discussed with the patient that there may be a patient responsible charge related to this service. The patient expressed understanding and agreed to proceed. She was at home and I was in my office. Pt has COVID at this time so Tele Appt deemed appropriate to discuss her condition.  History of Present Illness: [name] is a 28 y.o. G1P1001, no LMP due to Depo, presents today for a problem visit.  She complains of:  Vulvar/Vaginal concern:   This is a 28 y.o. old Caucasian/White female who presents for the evaluation of vulvar/vaginal swelling and pain. It has been going on for 2 weeks.  She has discomfort w activity.   Able to urinate without difficulty.  Feels swelling and bulge just beyond urethral meatus vaginally.  No drainage.  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  Was in hospital for that then.  She currently has Covid19 and has been symptomatic, slowly recovering now.  She gets off of [employer]. She indicates she first noticed the problem two weeks ago. She admits to symptoms of pain.  The following aggravating factors are identified: physical activity. The following alleviating factors are identified: none.  She has had previous treatment for this condition.   PMHx: She  has a past medical history of Dysplasia of cervix (2013) and Psoriasis of scalp (2004). Also,  has a past surgical history that includes Cesarean section; Cesarean section (N/A, 2013); laparoscopy (N/A, 05/23/2015); Cystoscopy (N/A, 05/23/2015); Esophagogastroduodenoscopy (N/A, 07/02/2015); Abdominal  surgery; Appendectomy; and Incision and drainage abscess (N/A, 10/11/2017)., family history includes Cancer in her maternal aunt, maternal grandmother, and mother.,  reports that she has quit smoking. Her smoking use included cigarettes. She smoked 0.50 packs per day. She has never used smokeless tobacco. She reports current drug use. Drug: Marijuana. She reports that she does not drink alcohol.  She has a current medication list which includes the following prescription(s): pantoprazole, cephalexin, ferrous sulfate, gabapentin, hydrocodone-acetaminophen, medroxyprogesterone, melatonin, and meloxicam. Also, is allergic to tramadol.  Review of Systems  Constitutional: Negative for chills, fever and malaise/fatigue.  HENT: Negative for congestion, sinus pain and sore throat.   Eyes: Negative for blurred vision and pain.  Respiratory: Negative for cough and wheezing.   Cardiovascular: Negative for chest pain and leg swelling.  Gastrointestinal: Negative for abdominal pain, constipation, diarrhea, heartburn, nausea and vomiting.  Genitourinary: Negative for dysuria, frequency, hematuria and urgency.  Musculoskeletal: Negative for back pain, joint pain, myalgias and neck pain.  Skin: Negative for itching and rash.  Neurological: Negative for dizziness, tremors and weakness.  Endo/Heme/Allergies: Does not bruise/bleed easily.  Psychiatric/Behavioral: Negative for depression. The patient is not nervous/anxious and does not have insomnia.      Observations/Objective: No exam today, due to telephone eVisit due to [facility] virus restriction on elective visits and procedures.  Prior visits reviewed along with ultrasounds/labs as indicated.  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  Deferred exam and office visit due to pt having covid19 and  recovering from this at this time.  Due to prior history of this problem and sx's  currently very similar to pt, will treat as such.  Keflex QID 14 days and Meloxicam twice daily for inflammation, to start now.  She will make appt for next week when she is off of quarantine (according to [facility]).     Follow Up Instructions: Next week evaluation   I discussed the assessment and treatment plan with the patient. The patient was provided an opportunity to ask questions and all were answered. The patient agreed with the plan and demonstrated an understanding of the instructions.   The patient was advised to call back or seek an in-person evaluation if the symptoms worsen or if the condition fails to improve as anticipated.  I provided 15 minutes of non-face-to-face time during this encounter.   Hoyt Koch, MD

## 2019-10-08 ENCOUNTER — Ambulatory Visit: Payer: Medicaid Other | Admitting: Obstetrics & Gynecology

## 2019-11-12 ENCOUNTER — Other Ambulatory Visit: Payer: Self-pay | Admitting: Primary Care

## 2019-11-12 DIAGNOSIS — M25552 Pain in left hip: Secondary | ICD-10-CM

## 2019-11-16 ENCOUNTER — Ambulatory Visit
Admission: RE | Admit: 2019-11-16 | Discharge: 2019-11-16 | Disposition: A | Discharge status: Home / Self Care | Payer: Medicaid Other | Source: Ambulatory Visit | Attending: Primary Care | Admitting: Primary Care

## 2019-11-16 ENCOUNTER — Other Ambulatory Visit: Payer: Self-pay | Admitting: Primary Care

## 2019-11-16 DIAGNOSIS — M25552 Pain in left hip: Secondary | ICD-10-CM | POA: Insufficient documentation

## 2020-06-04 ENCOUNTER — Other Ambulatory Visit: Payer: Self-pay | Admitting: Student

## 2020-06-04 ENCOUNTER — Other Ambulatory Visit: Payer: Self-pay | Admitting: Orthopedic Surgery

## 2020-06-04 DIAGNOSIS — M25512 Pain in left shoulder: Secondary | ICD-10-CM

## 2020-06-11 ENCOUNTER — Ambulatory Visit: Payer: Medicaid Other

## 2020-06-25 ENCOUNTER — Ambulatory Visit
Admission: RE | Admit: 2020-06-25 | Discharge: 2020-06-25 | Disposition: A | Discharge status: Home / Self Care | Payer: Medicaid Other | Source: Ambulatory Visit | Attending: Orthopedic Surgery | Admitting: Orthopedic Surgery

## 2020-06-25 ENCOUNTER — Other Ambulatory Visit: Payer: Self-pay

## 2020-06-25 DIAGNOSIS — M25512 Pain in left shoulder: Secondary | ICD-10-CM | POA: Insufficient documentation

## 2020-06-25 MED ORDER — LIDOCAINE HCL (PF) 1 % IJ SOLN
5.0000 mL | Freq: Once | INTRAMUSCULAR | Status: AC
  Administered 2020-06-25: 5 mL
  Filled 2020-06-25: qty 5

## 2020-06-25 MED ORDER — SODIUM CHLORIDE (PF) 0.9 % IJ SOLN
5.0000 mL | Freq: Once | INTRAMUSCULAR | Status: AC
  Administered 2020-06-25: 5 mL via INTRAVENOUS

## 2020-06-25 MED ORDER — IOHEXOL 180 MG/ML  SOLN
15.0000 mL | Freq: Once | INTRAMUSCULAR | Status: AC | PRN
  Administered 2020-06-25: 15 mL via INTRA_ARTICULAR

## 2020-06-25 MED ORDER — GADOBUTROL 1 MMOL/ML IV SOLN
0.0500 mL | Freq: Once | INTRAVENOUS | Status: AC | PRN
  Administered 2020-06-25: 0.05 mL

## 2020-07-21 ENCOUNTER — Other Ambulatory Visit (HOSPITAL_COMMUNITY)
Admission: RE | Admit: 2020-07-21 | Discharge: 2020-07-21 | Disposition: A | Discharge status: Home / Self Care | Payer: Medicaid Other | Source: Ambulatory Visit | Attending: Obstetrics & Gynecology | Admitting: Obstetrics & Gynecology

## 2020-07-21 ENCOUNTER — Encounter: Payer: Self-pay | Admitting: Obstetrics & Gynecology

## 2020-07-21 ENCOUNTER — Other Ambulatory Visit: Payer: Self-pay

## 2020-07-21 ENCOUNTER — Ambulatory Visit (INDEPENDENT_AMBULATORY_CARE_PROVIDER_SITE_OTHER): Payer: Medicaid Other | Admitting: Obstetrics & Gynecology

## 2020-07-21 VITALS — BP 120/80 | Ht 63.0 in | Wt 158.0 lb

## 2020-07-21 DIAGNOSIS — Z3009 Encounter for other general counseling and advice on contraception: Secondary | ICD-10-CM

## 2020-07-21 DIAGNOSIS — R350 Frequency of micturition: Secondary | ICD-10-CM | POA: Diagnosis not present

## 2020-07-21 DIAGNOSIS — Z124 Encounter for screening for malignant neoplasm of cervix: Secondary | ICD-10-CM | POA: Insufficient documentation

## 2020-07-21 LAB — POCT URINALYSIS DIPSTICK
Bilirubin, UA: NEGATIVE
Blood, UA: NEGATIVE
Glucose, UA: NEGATIVE
Ketones, UA: NEGATIVE
Leukocytes, UA: NEGATIVE
Nitrite, UA: NEGATIVE
Protein, UA: NEGATIVE
Spec Grav, UA: 1.01 (ref 1.010–1.025)
Urobilinogen, UA: 0.2 E.U./dL
pH, UA: 5 (ref 5.0–8.0)

## 2020-07-21 NOTE — Progress Notes (Signed)
Contraception Counseling Patient is a 29 yo G1P1 WF who presents for contraception counseling. The patient has no complaints today. The patient is sexually active. Pertinent past medical history: none.   Pt has been on Depo and wishes to discontinue.  No periods on Depo.  No desire for future fertility.  Prior CS 8 yrs ago, also surgery for adhesions.  Husband does not want to get vasectomy.  PMHx: She  has a past medical history of Dysplasia of cervix (2013) and Psoriasis of scalp (2004). Also,  has a past surgical history that includes Cesarean section; Cesarean section (N/A, 2013); laparoscopy (N/A, 05/23/2015); Cystoscopy (N/A, 05/23/2015); Esophagogastroduodenoscopy (N/A, 07/02/2015); Abdominal surgery; Appendectomy; and Incision and drainage abscess (N/A, 10/11/2017)., family history includes Cancer in her maternal aunt, maternal grandmother, and mother.,  reports that she has quit smoking. Her smoking use included cigarettes. She smoked 0.50 packs per day. She has never used smokeless tobacco. She reports current drug use. Drug: Marijuana. She reports that she does not drink alcohol.  She has a current medication list which includes the following prescription(s): buspirone, gabapentin, meloxicam, pantoprazole, ferrous sulfate, medroxyprogesterone, and melatonin. Also, is allergic to tramadol.  Review of Systems  Constitutional: Negative for chills, fever and malaise/fatigue.  HENT: Negative for congestion, sinus pain and sore throat.   Eyes: Negative for blurred vision and pain.  Respiratory: Negative for cough and wheezing.   Cardiovascular: Negative for chest pain and leg swelling.  Gastrointestinal: Negative for abdominal pain, constipation, diarrhea, heartburn, nausea and vomiting.  Genitourinary: Negative for dysuria, frequency, hematuria and urgency.  Musculoskeletal: Negative for back pain, joint pain, myalgias and neck pain.  Skin: Negative for itching and rash.  Neurological:  Negative for dizziness, tremors and weakness.  Endo/Heme/Allergies: Does not bruise/bleed easily.  Psychiatric/Behavioral: Negative for depression. The patient is not nervous/anxious and does not have insomnia.     Objective: BP 120/80   Ht 5\' 3"  (1.6 m)   Wt 158 lb (71.7 kg)   BMI 27.99 kg/m  Physical Exam Constitutional:      General: She is not in acute distress.    Appearance: She is well-developed.  Genitourinary:     Pelvic exam was performed with patient supine.     Vagina, uterus and rectum normal.     No lesions in the vagina.     No vaginal bleeding.     No cervical motion tenderness, friability, lesion or polyp.     Uterus is mobile.     Uterus is not enlarged.     No uterine mass detected.    Uterus is midaxial.     No right or left adnexal mass present.     Right adnexa not tender.     Left adnexa not tender.  HENT:     Head: Normocephalic and atraumatic. No laceration.     Right Ear: Hearing normal.     Left Ear: Hearing normal.     Mouth/Throat:     Pharynx: Uvula midline.  Eyes:     Pupils: Pupils are equal, round, and reactive to light.  Neck:     Thyroid: No thyromegaly.  Cardiovascular:     Rate and Rhythm: Normal rate and regular rhythm.     Heart sounds: No murmur heard.  No friction rub. No gallop.   Pulmonary:     Effort: Pulmonary effort is normal. No respiratory distress.     Breath sounds: Normal breath sounds. No wheezing.  Chest:     Breasts:  Right: No mass, skin change or tenderness.        Left: No mass, skin change or tenderness.  Abdominal:     General: Bowel sounds are normal. There is no distension.     Palpations: Abdomen is soft.     Tenderness: There is no abdominal tenderness. There is no rebound.  Musculoskeletal:        General: Normal range of motion.     Cervical back: Normal range of motion and neck supple.  Neurological:     Mental Status: She is alert and oriented to person, place, and time.     Cranial  Nerves: No cranial nerve deficit.  Skin:    General: Skin is warm and dry.  Psychiatric:        Judgment: Judgment normal.  Vitals reviewed.     ASSESSMENT/PLAN:    Problem List Items Addressed This Visit    Screening for cervical cancer     Sterilization consult        Birth Control I discussed multiple birth control options and methods with the patient.  The risks and benefits of each were reviewed.  The possible side effects including deep venous thrombosis, breast tenderness, fluid retention, mood changes and abnormal vaginal bleeding were discussed.  Combination as well as progesterone-only options, pros and cons counseled.  The patient has been fully informed about all methods of contraception, both temporary and permanent. She understands that tubal ligation is meant to be permanent, absolute and irreversible. She was told that there is an approximately 1 in 400 chance of a pregnancy in the future after tubal ligation. She was told the short and long term complications of tubal ligation. She understands the risks from this surgery include, but are not limited to, the risks of anesthesia, hemorrhage, infection, perforation, and injury to adjacent structures, bowel, bladder and blood vessels. Despite young age and risk for regret, pt strongly voices desire for permanent sterility surgery.  Consent papers signed today.  PAP today  Annamarie Major, MD, [facility], [facility] 07/21/2020  9:58 AM

## 2020-07-21 NOTE — Addendum Note (Signed)
Addended by: Cornelius Moras D on: 07/21/2020 10:13 AM   Modules accepted: Orders

## 2020-07-21 NOTE — Patient Instructions (Signed)
Surgery to Prevent Pregnancy Female sterilization is surgery to prevent pregnancy. In this surgery, the fallopian tubes are either blocked or closed off. When the fallopian tubes are closed, the eggs that the ovaries release cannot enter the uterus, sperm cannot reach the eggs, and you cannot get pregnant. Sterilization is permanent. It should only be done if you are sure that you do not want to be able to have children. What are the sterilization surgery options? There are several kinds of female sterilization surgeries. They include:  Laparoscopic tubal ligation. In this surgery, the fallopian tubes are tied off, sealed with heat, or blocked with a clip, ring, or clamp. A small portion of each fallopian tube may also be removed. This surgery is done through several small cuts (incisions) with special instruments that are inserted into your abdomen.  Postpartum tubal ligation. This is also called a mini-laparotomy. This surgery is done right after childbirth or 1 or 2 days after childbirth. In this surgery, the fallopian tubes are tied off, sealed with heat, or blocked with a clip, ring, or clamp. A small portion of each fallopian tube may also be removed. The surgery is done through a single incision in the abdomen.  Tubal ligation during a C-section. In this surgery, the fallopian tubes are tied off, sealed with heat, or blocked with a clip, ring, or clamp. A small portion of each fallopian tube may also be removed. The surgery is done at the same time as a C-section delivery. Is sterilization safe? Generally, sterilization is safe. Complications are rare. However, there are risks. They include:  Bleeding.  Infection.  Reaction to medicine used during the procedure.  Injury to surrounding organs.  Failure of the procedure. How effective is sterilization? Sterilization is nearly 100% effective, but it can fail. In rare cases, the fallopian tubes can grow back together over time. If this  happens, pregnancy may be possible and you will be able to get pregnant again. Women who have had this procedure have a higher chance of having an ectopic pregnancy. An ectopic pregnancy is a pregnancy that happens outside of the uterus. This kind of pregnancy can lead to serious bleeding if it is not treated. What are the benefits?  It is usually effective for a lifetime.  It is usually safe.  It does not have the drawbacks of other types of birth control in that your hormones are not affected. Because of this, your menstrual periods, sexual desire, and sexual performance will not be affected. What are the drawbacks?  You will need to recover and may have complications after surgery.  If you change your mind and decide that you want to have children, you may not be able to. Sterilization may be reversed, but a reversal is not always successful.  It does not provide protection against STDs (sexually transmitted diseases).  It increases the chance of having an ectopic pregnancy. Follow these instructions at home:  Keep all follow-up visits as told by your health care provider. This is important. Summary  Female sterilization is surgery to prevent pregnancy.  There are different types of female sterilization surgeries.  Sterilization may be reversed, but a reversal is not always successful.  Sterilization does not protect against STDs. This information is not intended to replace advice given to you by your health care provider. Make sure you discuss any questions you have with your health care provider. Document Revised: 02/28/2019 Document Reviewed: 05/26/2018 Elsevier Patient Education  2020 Elsevier Inc.  

## 2020-07-22 ENCOUNTER — Telehealth: Payer: Self-pay

## 2020-07-22 LAB — CYTOLOGY - PAP: Diagnosis: NEGATIVE

## 2020-07-22 NOTE — Telephone Encounter (Signed)
Pt aware.

## 2020-07-22 NOTE — Telephone Encounter (Signed)
Pt calling; appt c PH yesterday; has paperwork that needs to go out that she signed; ins card does have her middle initial on it; it says "Mandy Hansen; please call back to confirm receipt of this msg and that you were able to send the right paperwork off.  [phone]

## 2020-07-25 ENCOUNTER — Telehealth: Payer: Self-pay | Admitting: Obstetrics & Gynecology

## 2020-07-25 NOTE — Telephone Encounter (Signed)
Left a message for the patient to return the call.  

## 2020-07-25 NOTE — Telephone Encounter (Signed)
-----   Message from Nadara Mustard, MD sent at 07/21/2020  9:56 AM EDT ----- Regarding: Surgery Surgery Booking Request Patient Full Name:  Mandy Hansen  MRN: [id number]  DOB: 1991-02-26  Surgeon: Letitia Libra, MD  Requested Surgery Date and Time: >30 days from now (signing Tubal Consent today) Primary Diagnosis AND Code: Sterilization Secondary Diagnosis and Code: Z30.09 Surgical Procedure: Laparoscopy with Tubal Partial Salingectomy RNFA Requested?: No L&D Notification: No Admission Status: same day surgery Length of Surgery: 50 min Special Case Needs: No H&P: Yes Phone Interview???:  Yes Interpreter: No Medical Clearance:  No Special Scheduling Instructions: No Any known health/anesthesia issues, diabetes, sleep apnea, latex allergy, defibrillator/pacemaker?: No Acuity: P3   (P1 highest, P2 delay may cause harm, P3 low, elective gyn, P4 lowest)

## 2020-07-25 NOTE — Telephone Encounter (Signed)
Pt rtn'd call to schedule Lap w tubal partial salpingectomy w Tiburcio Pea  DOS 12/2  H&P 11/22 @ 4:30   Covid testing 11/30 @ 8-10:30, [address], drive up and wear mask. Advised pt to quarantine until DOS.  Pre-admit phone call appointment to be requested - date and time will be included on H&P paper work. Also all appointments will be updated on pt MyChart. Explained that this appointment has a call window. Based on the time scheduled will indicate if the call will be received within a 4 hour window before 1:00 or after.  Advised that pt may also receive calls from the hospital pharmacy and pre-service center.  Confirmed pt has Medicaid [employer] as primary insurance. No secondary insurance.

## 2020-08-05 NOTE — Telephone Encounter (Signed)
Does she need to come in to be seen?

## 2020-08-18 ENCOUNTER — Other Ambulatory Visit: Payer: Self-pay

## 2020-08-18 ENCOUNTER — Encounter: Payer: Self-pay | Admitting: Obstetrics & Gynecology

## 2020-08-18 ENCOUNTER — Ambulatory Visit (INDEPENDENT_AMBULATORY_CARE_PROVIDER_SITE_OTHER): Payer: Medicaid Other | Admitting: Obstetrics & Gynecology

## 2020-08-18 VITALS — BP 120/80 | Ht 64.0 in | Wt 155.0 lb

## 2020-08-18 DIAGNOSIS — Z3009 Encounter for other general counseling and advice on contraception: Secondary | ICD-10-CM

## 2020-08-18 NOTE — Patient Instructions (Signed)
PRE ADMISSION TESTING For Covid, prior to procedure Tuesday 9:00-10:00 [address] entrance (drive up)  Results in 48-72 hours You will not receive notification if test results are negative. If positive for Covid19, your provider will notify you by phone, with additional instructions.   Diagnostic Laparoscopy, Care After This sheet gives you information about how to care for yourself after your procedure. Your health care provider may also give you more specific instructions. If you have problems or questions, contact your health care provider. What can I expect after the procedure? After the procedure, it is common to have:  Mild discomfort in the abdomen.  Sore throat. Women who have laparoscopy with pelvic examination may have mild cramping and fluid coming from the vagina for a few days after the procedure. Follow these instructions at home: Medicines  Take over-the-counter and prescription medicines only as told by your health care provider.  If you were prescribed an antibiotic medicine, take it as told by your health care provider. Do not stop taking the antibiotic even if you start to feel better. Driving  Do not drive for 24 hours if you were given a medicine to help you relax (sedative) during your procedure.  Do not drive or use heavy machinery while taking prescription pain medicine. Bathing  Do not take baths, swim, or use a hot tub until your health care provider approves. You may take showers. Incision care   Follow instructions from your health care provider about how to take care of your incisions. Make sure you: ? Wash your hands with soap and water before you change your bandage (dressing). If soap and water are not available, use hand sanitizer. ? Change your dressing as told by your health care provider. ? Leave stitches (sutures), skin glue, or adhesive strips in place. These skin closures may need to stay in place for 2 weeks or longer. If  adhesive strip edges start to loosen and curl up, you may trim the loose edges. Do not remove adhesive strips completely unless your health care provider tells you to do that.  Check your incision areas every day for signs of infection. Check for: ? Redness, swelling, or pain. ? Fluid or blood. ? Warmth. ? Pus or a bad smell. Activity  Return to your normal activities as told by your health care provider. Ask your health care provider what activities are safe for you.  Do not lift anything that is heavier than 10 lb (4.5 kg), or the limit that you are told, until your health care provider says that it is safe. General instructions  To prevent or treat constipation while you are taking prescription pain medicine, your health care provider may recommend that you: ? Drink enough fluid to keep your urine pale yellow. ? Take over-the-counter or prescription medicines. ? Eat foods that are high in fiber, such as fresh fruits and vegetables, whole grains, and beans. ? Limit foods that are high in fat and processed sugars, such as fried and sweet foods.  Do not use any products that contain nicotine or tobacco, such as cigarettes and e-cigarettes. If you need help quitting, ask your health care provider.  Keep all follow-up visits as told by your health care provider. This is important. Contact a health care provider if:  You develop shoulder pain.  You feel lightheaded or faint.  You are unable to pass gas or have a bowel movement.  You feel nauseous or you vomit.  You develop a rash.  You   have redness, swelling, or pain around any incision.  You have fluid or blood coming from any incision.  Any incision feels warm to the touch.  You have pus or a bad smell coming from any incision.  You have a fever or chills. Get help right away if:  You have severe pain.  You have vomiting that does not go away.  You have heavy bleeding from the vagina.  Any incision opens.  You  have trouble breathing.  You have chest pain. Summary  After the procedure, it is common to have mild discomfort in the abdomen and a sore throat.  Check your incision areas every day for signs of infection.  Return to your normal activities as told by your health care provider. Ask your health care provider what activities are safe for you. This information is not intended to replace advice given to you by your health care provider. Make sure you discuss any questions you have with your health care provider. Document Revised: 08/26/2017 Document Reviewed: 03/09/2017 Elsevier Patient Education  2020 Elsevier Inc.  

## 2020-08-18 NOTE — Progress Notes (Signed)
PRE-OPERATIVE HISTORY AND PHYSICAL EXAM  HPI:  Mandy Hansen is a 29 y.o. G1P1001 No LMP recorded. Patient has had an injection.; she is being admitted for surgery related to requested sterilization.  PMHx: Past Medical History:  Diagnosis Date  . Dysplasia of cervix 2013   dyplasia has resolved since birth of son in 2013.  [name] Psoriasis of scalp 2004   mainly scalp, uses sun for treatment   Past Surgical History:  Procedure Laterality Date  . ABDOMINAL SURGERY    . APPENDECTOMY    . CESAREAN SECTION    . CESAREAN SECTION N/A 2013  . CYSTOSCOPY N/A 05/23/2015   Procedure: CYSTOSCOPY;  Surgeon: Nadara Mustard, MD;  Location: [facility] ORS;  Service: Gynecology;  Laterality: N/A;  . ESOPHAGOGASTRODUODENOSCOPY N/A 07/02/2015   Procedure: ESOPHAGOGASTRODUODENOSCOPY (EGD) looking in the esophagus stomach and upper small intestine with a lighted tube to evaluate and treat;  Surgeon: Christena Deem, MD;  Location: [facility] ENDOSCOPY;  Service: Endoscopy;  Laterality: N/A;  . INCISION AND DRAINAGE ABSCESS N/A 10/11/2017   Procedure: INCISION AND DRAINAGE SKENE CYST;  Surgeon: Natale Milch, MD;  Location: [facility] ORS;  Service: Gynecology;  Laterality: N/A;  . LAPAROSCOPY N/A 05/23/2015   Procedure: LAPAROSCOPY DIAGNOSTIC;  Surgeon: Nadara Mustard, MD;  Location: [facility] ORS;  Service: Gynecology;  Laterality: N/A;   Family History  Problem Relation Age of Onset  . Cancer Mother   . Cancer Maternal Aunt   . Cancer Maternal Grandmother    Social History   Tobacco Use  . Smoking status: Former Smoker    Packs/day: 0.50    Types: Cigarettes  . Smokeless tobacco: Never Used  Vaping Use  . Vaping Use: Never used  Substance Use Topics  . Alcohol use: No    Alcohol/week: 0.0 standard drinks  . Drug use: Yes    Types: Marijuana    Comment: Once every 3 months    Current Outpatient Medications:  .  acetaminophen (TYLENOL) 500 MG tablet, Take 1,000-1,500 mg by mouth every 8 (eight)  hours as needed for moderate pain., Disp: , Rfl:  .  busPIRone (BUSPAR) 15 MG tablet, Take 15 mg by mouth 2 (two) times daily. , Disp: , Rfl:  .  calcium carbonate (TUMS - DOSED IN MG ELEMENTAL CALCIUM) 500 MG chewable tablet, Chew 1 tablet by mouth daily as needed for indigestion or heartburn., Disp: , Rfl:  .  gabapentin (NEURONTIN) 300 MG capsule, Take 300 mg by mouth 3 (three) times daily. , Disp: , Rfl:  .  meloxicam (MOBIC) 7.5 MG tablet, Take 1 tablet (7.5 mg total) by mouth 2 (two) times daily as needed for pain., Disp: 30 tablet, Rfl: 1 .  pantoprazole (PROTONIX) 40 MG tablet, Take 40 mg by mouth daily. , Disp: , Rfl:  .  medroxyPROGESTERone (DEPO-PROVERA) 150 MG/ML injection, Inject 1 mL (150 mg total) into the muscle every 3 (three) months. (Patient not taking: Reported on 08/14/2020), Disp: 1 mL, Rfl: 4 Allergies: Norethindrone-eth estradiol and Tramadol  Review of Systems  Constitutional: Negative for chills, fever and malaise/fatigue.  HENT: Negative for congestion, sinus pain and sore throat.   Eyes: Negative for blurred vision and pain.  Respiratory: Negative for cough and wheezing.   Cardiovascular: Negative for chest pain and leg swelling.  Gastrointestinal: Negative for abdominal pain, constipation, diarrhea, heartburn, nausea and vomiting.  Genitourinary: Negative for dysuria, frequency, hematuria and urgency.  Musculoskeletal: Negative for back pain, joint pain, myalgias  and neck pain.  Skin: Negative for itching and rash.  Neurological: Negative for dizziness, tremors and weakness.  Endo/Heme/Allergies: Does not bruise/bleed easily.  Psychiatric/Behavioral: Negative for depression. The patient is not nervous/anxious and does not have insomnia.     Objective: BP 120/80   Ht 5\' 4"  (1.626 m)   Wt 155 lb (70.3 kg)   BMI 26.61 kg/m   Filed Weights   08/18/20 1622  Weight: 155 lb (70.3 kg)   Physical Exam Constitutional:      General: She is not in acute  distress.    Appearance: She is well-developed.  HENT:     Head: Normocephalic and atraumatic. No laceration.     Right Ear: Hearing normal.     Left Ear: Hearing normal.     Mouth/Throat:     Pharynx: Uvula midline.  Eyes:     Pupils: Pupils are equal, round, and reactive to light.  Neck:     Thyroid: No thyromegaly.  Cardiovascular:     Rate and Rhythm: Normal rate and regular rhythm.     Heart sounds: No murmur heard.  No friction rub. No gallop.   Pulmonary:     Effort: Pulmonary effort is normal. No respiratory distress.     Breath sounds: Normal breath sounds. No wheezing.  Chest:     Breasts:        Right: No mass, skin change or tenderness.        Left: No mass, skin change or tenderness.  Abdominal:     General: Bowel sounds are normal. There is no distension.     Palpations: Abdomen is soft.     Tenderness: There is no abdominal tenderness. There is no rebound.  Musculoskeletal:        General: Normal range of motion.     Cervical back: Normal range of motion and neck supple.  Neurological:     Mental Status: She is alert and oriented to person, place, and time.     Cranial Nerves: No cranial nerve deficit.  Skin:    General: Skin is warm and dry.  Psychiatric:        Judgment: Judgment normal.  Vitals reviewed.     Assessment: 1. Sterilization consult   Plan Lap BTL  The patient has been fully informed about all methods of contraception, both temporary and permanent. She understands that tubal ligation is meant to be permanent, absolute and irreversible. She was told that there is an approximately 1 in 400 chance of a pregnancy in the future after tubal ligation. She was told the short and long term complications of tubal ligation. She understands the risks from this surgery include, but are not limited to, the risks of anesthesia, hemorrhage, infection, perforation, and injury to adjacent structures, bowel, bladder and blood vessels.   08/20/20, MD,  Annamarie Major Ob/Gyn, [facility] 08/18/2020  4:47 PM

## 2020-08-18 NOTE — H&P (View-Only) (Signed)
PRE-OPERATIVE HISTORY AND PHYSICAL EXAM  HPI:  Mandy Hansen is a 29 y.o. G1P1001 No LMP recorded. Patient has had an injection.; she is being admitted for surgery related to requested sterilization.  PMHx: Past Medical History:  Diagnosis Date  . Dysplasia of cervix 2013   dyplasia has resolved since birth of son in 2013.  [name] Psoriasis of scalp 2004   mainly scalp, uses sun for treatment   Past Surgical History:  Procedure Laterality Date  . ABDOMINAL SURGERY    . APPENDECTOMY    . CESAREAN SECTION    . CESAREAN SECTION N/A 2013  . CYSTOSCOPY N/A 05/23/2015   Procedure: CYSTOSCOPY;  Surgeon: Nadara Mustard, MD;  Location: [facility] ORS;  Service: Gynecology;  Laterality: N/A;  . ESOPHAGOGASTRODUODENOSCOPY N/A 07/02/2015   Procedure: ESOPHAGOGASTRODUODENOSCOPY (EGD) looking in the esophagus stomach and upper small intestine with a lighted tube to evaluate and treat;  Surgeon: Christena Deem, MD;  Location: [facility] ENDOSCOPY;  Service: Endoscopy;  Laterality: N/A;  . INCISION AND DRAINAGE ABSCESS N/A 10/11/2017   Procedure: INCISION AND DRAINAGE SKENE CYST;  Surgeon: Natale Milch, MD;  Location: [facility] ORS;  Service: Gynecology;  Laterality: N/A;  . LAPAROSCOPY N/A 05/23/2015   Procedure: LAPAROSCOPY DIAGNOSTIC;  Surgeon: Nadara Mustard, MD;  Location: [facility] ORS;  Service: Gynecology;  Laterality: N/A;   Family History  Problem Relation Age of Onset  . Cancer Mother   . Cancer Maternal Aunt   . Cancer Maternal Grandmother    Social History   Tobacco Use  . Smoking status: Former Smoker    Packs/day: 0.50    Types: Cigarettes  . Smokeless tobacco: Never Used  Vaping Use  . Vaping Use: Never used  Substance Use Topics  . Alcohol use: No    Alcohol/week: 0.0 standard drinks  . Drug use: Yes    Types: Marijuana    Comment: Once every 3 months    Current Outpatient Medications:  .  acetaminophen (TYLENOL) 500 MG tablet, Take 1,000-1,500 mg by mouth every 8 (eight)  hours as needed for moderate pain., Disp: , Rfl:  .  busPIRone (BUSPAR) 15 MG tablet, Take 15 mg by mouth 2 (two) times daily. , Disp: , Rfl:  .  calcium carbonate (TUMS - DOSED IN MG ELEMENTAL CALCIUM) 500 MG chewable tablet, Chew 1 tablet by mouth daily as needed for indigestion or heartburn., Disp: , Rfl:  .  gabapentin (NEURONTIN) 300 MG capsule, Take 300 mg by mouth 3 (three) times daily. , Disp: , Rfl:  .  meloxicam (MOBIC) 7.5 MG tablet, Take 1 tablet (7.5 mg total) by mouth 2 (two) times daily as needed for pain., Disp: 30 tablet, Rfl: 1 .  pantoprazole (PROTONIX) 40 MG tablet, Take 40 mg by mouth daily. , Disp: , Rfl:  .  medroxyPROGESTERone (DEPO-PROVERA) 150 MG/ML injection, Inject 1 mL (150 mg total) into the muscle every 3 (three) months. (Patient not taking: Reported on 08/14/2020), Disp: 1 mL, Rfl: 4 Allergies: Norethindrone-eth estradiol and Tramadol  Review of Systems  Constitutional: Negative for chills, fever and malaise/fatigue.  HENT: Negative for congestion, sinus pain and sore throat.   Eyes: Negative for blurred vision and pain.  Respiratory: Negative for cough and wheezing.   Cardiovascular: Negative for chest pain and leg swelling.  Gastrointestinal: Negative for abdominal pain, constipation, diarrhea, heartburn, nausea and vomiting.  Genitourinary: Negative for dysuria, frequency, hematuria and urgency.  Musculoskeletal: Negative for back pain, joint pain, myalgias  and neck pain.  Skin: Negative for itching and rash.  Neurological: Negative for dizziness, tremors and weakness.  Endo/Heme/Allergies: Does not bruise/bleed easily.  Psychiatric/Behavioral: Negative for depression. The patient is not nervous/anxious and does not have insomnia.     Objective: BP 120/80   Ht 5' 4" (1.626 m)   Wt 155 lb (70.3 kg)   BMI 26.61 kg/m   Filed Weights   08/18/20 1622  Weight: 155 lb (70.3 kg)   Physical Exam Constitutional:      General: She is not in acute  distress.    Appearance: She is well-developed.  HENT:     Head: Normocephalic and atraumatic. No laceration.     Right Ear: Hearing normal.     Left Ear: Hearing normal.     Mouth/Throat:     Pharynx: Uvula midline.  Eyes:     Pupils: Pupils are equal, round, and reactive to light.  Neck:     Thyroid: No thyromegaly.  Cardiovascular:     Rate and Rhythm: Normal rate and regular rhythm.     Heart sounds: No murmur heard.  No friction rub. No gallop.   Pulmonary:     Effort: Pulmonary effort is normal. No respiratory distress.     Breath sounds: Normal breath sounds. No wheezing.  Chest:     Breasts:        Right: No mass, skin change or tenderness.        Left: No mass, skin change or tenderness.  Abdominal:     General: Bowel sounds are normal. There is no distension.     Palpations: Abdomen is soft.     Tenderness: There is no abdominal tenderness. There is no rebound.  Musculoskeletal:        General: Normal range of motion.     Cervical back: Normal range of motion and neck supple.  Neurological:     Mental Status: She is alert and oriented to person, place, and time.     Cranial Nerves: No cranial nerve deficit.  Skin:    General: Skin is warm and dry.  Psychiatric:        Judgment: Judgment normal.  Vitals reviewed.     Assessment: 1. Sterilization consult   Plan Lap BTL  The patient has been fully informed about all methods of contraception, both temporary and permanent. She understands that tubal ligation is meant to be permanent, absolute and irreversible. She was told that there is an approximately 1 in 400 chance of a pregnancy in the future after tubal ligation. She was told the short and long term complications of tubal ligation. She understands the risks from this surgery include, but are not limited to, the risks of anesthesia, hemorrhage, infection, perforation, and injury to adjacent structures, bowel, bladder and blood vessels.   Paul Mariska Daffin, MD,  FACOG [facility], [facility] 08/18/2020  4:47 PM  

## 2020-08-20 ENCOUNTER — Other Ambulatory Visit: Payer: Self-pay

## 2020-08-20 ENCOUNTER — Other Ambulatory Visit
Admission: RE | Admit: 2020-08-20 | Discharge: 2020-08-20 | Disposition: A | Discharge status: Home / Self Care | Payer: Medicaid Other | Source: Ambulatory Visit | Attending: Obstetrics & Gynecology | Admitting: Obstetrics & Gynecology

## 2020-08-20 DIAGNOSIS — Z01818 Encounter for other preprocedural examination: Secondary | ICD-10-CM | POA: Insufficient documentation

## 2020-08-20 HISTORY — DX: Gastro-esophageal reflux disease without esophagitis: K21.9

## 2020-08-20 NOTE — Patient Instructions (Signed)
Your procedure is scheduled on: Thursday 08/28/20.  Report to THE FIRST FLOOR REGISTRATION DESK IN THE MEDICAL MALL ON THE MORNING OF SURGERY FIRST, THEN YOU WILL CHECK IN AT THE SURGERY INFORMATION DESK LOCATED OUTSIDE THE SAME DAY SURGERY DEPARTMENT LOCATED ON 2ND FLOOR MEDICAL MALL ENTRANCE.  To find out your arrival time please call [phone] between 1PM - 3PM on Wednesday 08/27/20.   Remember: Instructions that are not followed completely may result in serious medical risk, up to and including death, or upon the discretion of your surgeon and anesthesiologist your surgery may need to be rescheduled.     __X__ 1. Do not eat food after midnight the night before your procedure.                 No gum chewing or hard candies. You may drink clear liquids up to 2 hours                 before you are scheduled to arrive for your surgery- DO NOT drink clear                 liquids within 2 hours of the start of your surgery.                 Clear Liquids include:  water, apple juice without pulp, clear carbohydrate                 drink such as Clearfast or Gatorade, Black Coffee or Tea (Do not add                 milk or creamer to coffee or tea).  __X__2.  On the morning of surgery brush your teeth with toothpaste and water, you may rinse your mouth with mouthwash if you wish.  Do not swallow any toothpaste or mouthwash.    __X__ 3.  No Alcohol for 24 hours before or after surgery.  __X__ 4.  Do Not Smoke or use e-cigarettes For 24 Hours Prior to Your Surgery.                 Do not use any chewable tobacco products for at least 6 hours prior to                 surgery.  __X__5.  Notify your doctor if there is any change in your medical condition      (cold, fever, infections).      Do NOT wear jewelry, make-up, hairpins, clips or nail polish. Do NOT wear lotions, powders, or perfumes.  Do NOT shave 48 hours prior to surgery. Men may shave face and neck. Do NOT bring valuables to  the hospital.     [facility] is not responsible for any belongings or valuables.   Contacts, dentures/partials or body piercings may not be worn into surgery. Bring a case for your contacts, glasses or hearing aids, a denture cup will be supplied.     Patients discharged the day of surgery will not be allowed to drive home.     __X__ Take these medicines the morning of surgery with A SIP OF WATER:     1. pantoprazole (PROTONIX)      __X__ Use CHG Soap/SAGE wipes as directed  __X__ Stop Anti-inflammatories 7 days before surgery such as Advil, Ibuprofen, Motrin, BC or Goodies Powder, Naprosyn, Naproxen, Aleve, Aspirin, Meloxicam. May take Tylenol if needed for pain or discomfort.   __X__Do not start taking any new  herbal supplements or vitamins prior to your procedure.    Wear comfortable clothing (specific to your surgery type) to the hospital.  Plan for stool softeners for home use; pain medications have a tendency to cause constipation. You can also help prevent constipation by eating foods high in fiber such as fruits and vegetables and drinking plenty of fluids as your diet allows.  After surgery, you can prevent lung complications by doing breathing exercises.Take deep breaths and cough every 1-2 hours. Your doctor may order a device called an Incentive Spirometer to help you take deep breaths.  Please call the Pre-Admissions Testing Department at [phone] if you have any questions about these instructions.

## 2020-08-26 ENCOUNTER — Other Ambulatory Visit
Admission: RE | Admit: 2020-08-26 | Discharge: 2020-08-26 | Disposition: A | Discharge status: Home / Self Care | Payer: Medicaid Other | Source: Ambulatory Visit | Attending: Obstetrics & Gynecology | Admitting: Obstetrics & Gynecology

## 2020-08-26 ENCOUNTER — Other Ambulatory Visit: Payer: Self-pay

## 2020-08-26 DIAGNOSIS — Z01812 Encounter for preprocedural laboratory examination: Secondary | ICD-10-CM | POA: Diagnosis not present

## 2020-08-26 DIAGNOSIS — Z20822 Contact with and (suspected) exposure to covid-19: Secondary | ICD-10-CM | POA: Insufficient documentation

## 2020-08-26 LAB — CBC
HCT: 41.9 % (ref 36.0–46.0)
Hemoglobin: 13.5 g/dL (ref 12.0–15.0)
MCH: 32.5 pg (ref 26.0–34.0)
MCHC: 32.2 g/dL (ref 30.0–36.0)
MCV: 100.7 fL — ABNORMAL HIGH (ref 80.0–100.0)
Platelets: 329 10*3/uL (ref 150–400)
RBC: 4.16 MIL/uL (ref 3.87–5.11)
RDW: 12.9 % (ref 11.5–15.5)
WBC: 10.7 10*3/uL — ABNORMAL HIGH (ref 4.0–10.5)
nRBC: 0 % (ref 0.0–0.2)

## 2020-08-26 LAB — TYPE AND SCREEN
ABO/RH(D): A POS
Antibody Screen: NEGATIVE

## 2020-08-27 LAB — SARS CORONAVIRUS 2 (TAT 6-24 HRS): SARS Coronavirus 2: NEGATIVE

## 2020-08-27 MED ORDER — CHLORHEXIDINE GLUCONATE 0.12 % MT SOLN: 15.0000 mL | Freq: Once | OROMUCOSAL | Status: AC

## 2020-08-27 MED ORDER — LACTATED RINGERS IV SOLN: INTRAVENOUS | Status: DC

## 2020-08-27 MED ORDER — POVIDONE-IODINE 10 % EX SWAB: 2.0000 "application " | Freq: Once | CUTANEOUS | Status: DC

## 2020-08-27 MED ORDER — ORAL CARE MOUTH RINSE
15.0000 mL | Freq: Once | OROMUCOSAL | Status: AC
  Administered 2020-08-28: 15 mL via OROMUCOSAL

## 2020-08-28 ENCOUNTER — Ambulatory Visit: Payer: Medicaid Other | Admitting: Certified Registered"

## 2020-08-28 ENCOUNTER — Encounter
Admission: RE | Disposition: A | Discharge status: Home / Self Care | Payer: Self-pay | Source: Home / Self Care | Attending: Obstetrics & Gynecology

## 2020-08-28 ENCOUNTER — Other Ambulatory Visit: Payer: Self-pay

## 2020-08-28 ENCOUNTER — Ambulatory Visit
Admission: RE | Admit: 2020-08-28 | Discharge: 2020-08-28 | Disposition: A | Discharge status: Home / Self Care | Payer: Medicaid Other | Attending: Obstetrics & Gynecology | Admitting: Obstetrics & Gynecology

## 2020-08-28 ENCOUNTER — Encounter: Payer: Self-pay | Admitting: Obstetrics & Gynecology

## 2020-08-28 DIAGNOSIS — Z302 Encounter for sterilization: Secondary | ICD-10-CM

## 2020-08-28 DIAGNOSIS — Z79899 Other long term (current) drug therapy: Secondary | ICD-10-CM | POA: Insufficient documentation

## 2020-08-28 DIAGNOSIS — Z888 Allergy status to other drugs, medicaments and biological substances status: Secondary | ICD-10-CM | POA: Diagnosis not present

## 2020-08-28 DIAGNOSIS — Z885 Allergy status to narcotic agent status: Secondary | ICD-10-CM | POA: Insufficient documentation

## 2020-08-28 HISTORY — PX: LAPAROSCOPIC BILATERAL SALPINGECTOMY: SHX5889

## 2020-08-28 LAB — URINE DRUG SCREEN, QUALITATIVE (ARMC ONLY)
Amphetamines, Ur Screen: NOT DETECTED
Barbiturates, Ur Screen: NOT DETECTED
Benzodiazepine, Ur Scrn: NOT DETECTED
Cannabinoid 50 Ng, Ur ~~LOC~~: POSITIVE — AB
Cocaine Metabolite,Ur ~~LOC~~: NOT DETECTED
MDMA (Ecstasy)Ur Screen: NOT DETECTED
Methadone Scn, Ur: NOT DETECTED
Opiate, Ur Screen: NOT DETECTED
Phencyclidine (PCP) Ur S: NOT DETECTED
Tricyclic, Ur Screen: NOT DETECTED

## 2020-08-28 SURGERY — SALPINGECTOMY, BILATERAL, LAPAROSCOPIC
Anesthesia: General | Laterality: Bilateral

## 2020-08-28 MED ORDER — ACETAMINOPHEN 10 MG/ML IV SOLN
INTRAVENOUS | Status: AC
  Filled 2020-08-28: qty 100

## 2020-08-28 MED ORDER — PROPOFOL 10 MG/ML IV BOLUS
INTRAVENOUS | Status: AC
  Filled 2020-08-28: qty 20

## 2020-08-28 MED ORDER — FENTANYL CITRATE (PF) 100 MCG/2ML IJ SOLN
INTRAMUSCULAR | Status: AC
  Administered 2020-08-28: 25 ug via INTRAVENOUS
  Filled 2020-08-28: qty 2

## 2020-08-28 MED ORDER — MORPHINE SULFATE (PF) 2 MG/ML IV SOLN: 1.0000 mg | INTRAVENOUS | Status: DC | PRN

## 2020-08-28 MED ORDER — ACETAMINOPHEN 10 MG/ML IV SOLN
INTRAVENOUS | Status: DC | PRN
  Administered 2020-08-28: 1000 mg via INTRAVENOUS

## 2020-08-28 MED ORDER — ACETAMINOPHEN 325 MG PO TABS: 650.0000 mg | ORAL_TABLET | ORAL | Status: DC | PRN

## 2020-08-28 MED ORDER — LACTATED RINGERS IV SOLN: INTRAVENOUS | Status: DC

## 2020-08-28 MED ORDER — GLYCOPYRROLATE 0.2 MG/ML IJ SOLN
INTRAMUSCULAR | Status: DC | PRN
  Administered 2020-08-28: .2 mg via INTRAVENOUS

## 2020-08-28 MED ORDER — LIDOCAINE HCL (CARDIAC) PF 100 MG/5ML IV SOSY
PREFILLED_SYRINGE | INTRAVENOUS | Status: DC | PRN
  Administered 2020-08-28: 60 mg via INTRAVENOUS

## 2020-08-28 MED ORDER — ROCURONIUM BROMIDE 100 MG/10ML IV SOLN
INTRAVENOUS | Status: DC | PRN
  Administered 2020-08-28: 50 mg via INTRAVENOUS

## 2020-08-28 MED ORDER — MIDAZOLAM HCL 2 MG/2ML IJ SOLN
INTRAMUSCULAR | Status: DC | PRN
  Administered 2020-08-28: 2 mg via INTRAVENOUS

## 2020-08-28 MED ORDER — FENTANYL CITRATE (PF) 100 MCG/2ML IJ SOLN
INTRAMUSCULAR | Status: DC | PRN
  Administered 2020-08-28 (×2): 50 ug via INTRAVENOUS

## 2020-08-28 MED ORDER — MIDAZOLAM HCL 2 MG/2ML IJ SOLN
INTRAMUSCULAR | Status: AC
  Filled 2020-08-28: qty 2

## 2020-08-28 MED ORDER — PHENYLEPHRINE HCL (PRESSORS) 10 MG/ML IV SOLN
INTRAVENOUS | Status: DC | PRN
  Administered 2020-08-28: 50 ug via INTRAVENOUS

## 2020-08-28 MED ORDER — DEXMEDETOMIDINE (PRECEDEX) IN NS 20 MCG/5ML (4 MCG/ML) IV SYRINGE
PREFILLED_SYRINGE | INTRAVENOUS | Status: DC | PRN
  Administered 2020-08-28: 8 ug via INTRAVENOUS
  Administered 2020-08-28: 12 ug via INTRAVENOUS

## 2020-08-28 MED ORDER — ACETAMINOPHEN 650 MG RE SUPP
650.0000 mg | RECTAL | Status: DC | PRN
  Filled 2020-08-28: qty 1

## 2020-08-28 MED ORDER — ONDANSETRON HCL 4 MG/2ML IJ SOLN
INTRAMUSCULAR | Status: DC | PRN
  Administered 2020-08-28 (×2): 4 mg via INTRAVENOUS

## 2020-08-28 MED ORDER — MEPERIDINE HCL 50 MG/ML IJ SOLN: 6.2500 mg | INTRAMUSCULAR | Status: DC | PRN

## 2020-08-28 MED ORDER — OXYCODONE-ACETAMINOPHEN 5-325 MG PO TABS
ORAL_TABLET | ORAL | Status: AC
  Filled 2020-08-28: qty 1

## 2020-08-28 MED ORDER — CHLORHEXIDINE GLUCONATE 0.12 % MT SOLN
OROMUCOSAL | Status: AC
  Filled 2020-08-28: qty 15

## 2020-08-28 MED ORDER — DROPERIDOL 2.5 MG/ML IJ SOLN
0.6250 mg | Freq: Once | INTRAMUSCULAR | Status: DC | PRN
  Filled 2020-08-28: qty 2

## 2020-08-28 MED ORDER — FENTANYL CITRATE (PF) 100 MCG/2ML IJ SOLN
INTRAMUSCULAR | Status: AC
  Filled 2020-08-28: qty 2

## 2020-08-28 MED ORDER — OXYCODONE-ACETAMINOPHEN 5-325 MG PO TABS
1.0000 | ORAL_TABLET | ORAL | Status: DC | PRN
  Administered 2020-08-28: 1 via ORAL

## 2020-08-28 MED ORDER — KETOROLAC TROMETHAMINE 30 MG/ML IJ SOLN
INTRAMUSCULAR | Status: DC | PRN
  Administered 2020-08-28: 30 mg via INTRAVENOUS

## 2020-08-28 MED ORDER — OXYCODONE-ACETAMINOPHEN 5-325 MG PO TABS: 1.0000 | ORAL_TABLET | ORAL | 0 refills | Status: DC | PRN

## 2020-08-28 MED ORDER — PROPOFOL 10 MG/ML IV BOLUS
INTRAVENOUS | Status: DC | PRN
  Administered 2020-08-28: 150 mg via INTRAVENOUS
  Administered 2020-08-28: 50 mg via INTRAVENOUS

## 2020-08-28 MED ORDER — BUPIVACAINE HCL (PF) 0.5 % IJ SOLN
INTRAMUSCULAR | Status: DC | PRN
  Administered 2020-08-28: 7 mL

## 2020-08-28 MED ORDER — SUGAMMADEX SODIUM 200 MG/2ML IV SOLN
INTRAVENOUS | Status: DC | PRN
  Administered 2020-08-28: 200 mg via INTRAVENOUS

## 2020-08-28 MED ORDER — OXYCODONE HCL 5 MG PO TABS
ORAL_TABLET | ORAL | Status: AC
  Filled 2020-08-28: qty 1

## 2020-08-28 MED ORDER — DEXAMETHASONE SODIUM PHOSPHATE 10 MG/ML IJ SOLN
INTRAMUSCULAR | Status: DC | PRN
  Administered 2020-08-28: 10 mg via INTRAVENOUS

## 2020-08-28 MED ORDER — PROMETHAZINE HCL 25 MG/ML IJ SOLN: 6.2500 mg | INTRAMUSCULAR | Status: DC | PRN

## 2020-08-28 MED ORDER — FENTANYL CITRATE (PF) 100 MCG/2ML IJ SOLN
25.0000 ug | INTRAMUSCULAR | Status: DC | PRN
  Administered 2020-08-28: 25 ug via INTRAVENOUS

## 2020-08-28 SURGICAL SUPPLY — 34 items
ADH SKN CLS APL DERMABOND .7 (GAUZE/BANDAGES/DRESSINGS) ×1
APL PRP STRL LF DISP 70% ISPRP (MISCELLANEOUS) ×1
BLADE SURG SZ11 CARB STEEL (BLADE) ×3 IMPLANT
CATH ROBINSON RED A/P 16FR (CATHETERS) ×3 IMPLANT
CHLORAPREP W/TINT 26 (MISCELLANEOUS) ×3 IMPLANT
COVER WAND RF STERILE (DRAPES) IMPLANT
DERMABOND ADVANCED (GAUZE/BANDAGES/DRESSINGS) ×2
DERMABOND ADVANCED .7 DNX12 (GAUZE/BANDAGES/DRESSINGS) ×1 IMPLANT
DRSG TEGADERM 2-3/8X2-3/4 SM (GAUZE/BANDAGES/DRESSINGS) ×9 IMPLANT
GAUZE 4X4 16PLY RFD (DISPOSABLE) ×3 IMPLANT
GLOVE BIO SURGEON STRL SZ8 (GLOVE) ×6 IMPLANT
GLOVE INDICATOR 8.0 STRL GRN (GLOVE) ×6 IMPLANT
GOWN STRL REUS W/ TWL LRG LVL3 (GOWN DISPOSABLE) ×2 IMPLANT
GOWN STRL REUS W/ TWL XL LVL3 (GOWN DISPOSABLE) ×1 IMPLANT
GOWN STRL REUS W/TWL LRG LVL3 (GOWN DISPOSABLE) ×6
GOWN STRL REUS W/TWL XL LVL3 (GOWN DISPOSABLE) ×3
KIT PINK PAD W/HEAD ARE REST (MISCELLANEOUS) ×3
KIT PINK PAD W/HEAD ARM REST (MISCELLANEOUS) ×1 IMPLANT
LABEL OR SOLS (LABEL) ×3 IMPLANT
NEEDLE VERESS 14GA 120MM (NEEDLE) ×3 IMPLANT
NS IRRIG 500ML POUR BTL (IV SOLUTION) ×3 IMPLANT
PACK GYN LAPAROSCOPIC (MISCELLANEOUS) ×3 IMPLANT
PAD PREP 24X41 OB/GYN DISP (PERSONAL CARE ITEMS) ×3 IMPLANT
SET TUBE SMOKE EVAC HIGH FLOW (TUBING) ×3 IMPLANT
SHEARS HARMONIC ACE PLUS 36CM (ENDOMECHANICALS) ×3 IMPLANT
SLEEVE ENDOPATH XCEL 5M (ENDOMECHANICALS) ×6 IMPLANT
SPONGE GAUZE 2X2 8PLY STER LF (GAUZE/BANDAGES/DRESSINGS) ×3
SPONGE GAUZE 2X2 8PLY STRL LF (GAUZE/BANDAGES/DRESSINGS) ×6 IMPLANT
STRAP SAFETY 5IN WIDE (MISCELLANEOUS) ×3 IMPLANT
SUT VIC AB 0 CT1 36 (SUTURE) ×3 IMPLANT
SUT VIC AB 2-0 UR6 27 (SUTURE) IMPLANT
SUT VIC AB 4-0 PS2 18 (SUTURE) IMPLANT
SYR 10ML LL (SYRINGE) ×3 IMPLANT
TROCAR XCEL NON-BLD 5MMX100MML (ENDOMECHANICALS) ×3 IMPLANT

## 2020-08-28 NOTE — Op Note (Signed)
  Operative Note   08/28/2020  PRE-OP DIAGNOSIS: Desire for permanent sterilization  POST-OP DIAGNOSIS: same   PROCEDURE: Procedure(s): LAPAROSCOPY with TUBAL PARTIAL SALPINGECTOMY   SURGEON: Annamarie Major, MD, FACOG  ANESTHESIA: Choice   ESTIMATED BLOOD LOSS: Min  COMPLICATIONS: None  DISPOSITION: PACU - hemodynamically stable.  CONDITION: stable  FINDINGS: Laparoscopic survey of the abdomen revealed a grossly normal uterus, tubes, ovaries, liver edge, gallbladder edge and appendix, No intra-abdominal adhesions were noted.  PROCEDURE IN DETAIL: The patient was taken to the OR where anesthesia was administed. The patient was positioned in dorsal lithotomy in the [place] stirrups. The patient was then examined under anesthesia with the above noted findings. The patient was prepped and draped in the normal sterile fashion and bladder was drained using a red rubber cathater. Speculum exam normal, and a sponge stick was placed for manipulation purposes.  Attention was turned to the patient's abdomen where a 5 mm skin incision was made in the umbilical fold, after injection of local anesthesia. The Veress step needle was carefully introduced into the peritoneal cavity with placement confirmed using the hanging drop technique.  Pneumoperitoneum was obtained. The 5 mm port was then placed under direct visualization with the operative laparoscope  The above noted findings.  Trendelenburg.  A 5 mm trocar was then placed in the right lower quadrant under direct visualization with the laparoscope.  Right and left fallopian tubes are identified and followed out to their fimbria.  Each tube is excised utilizing the Harmonic scapel to include the fibria.  No injuries or bleeding was noted.  All instruments and ports were then removed from the abdomen after gas was expelled and patient was leveled.   The skin was closed with skin adhesive. The patient tolerated the procedure well. All counts were correct  x 2. The patient was transferred to the recovery room awake, alert and breathing independently.  Annamarie Major, MD, [facility], [facility] 08/28/2020  5:10 PM

## 2020-08-28 NOTE — Interval H&P Note (Signed)
History and Physical Interval Note:  08/28/2020 1:41 PM  Mandy Hansen  has presented today for surgery, with the diagnosis of Sterilization Z30.09.  The various methods of treatment have been discussed with the patient and family. After consideration of risks, benefits and other options for treatment, the patient has consented to  Procedure(s): LAPAROSCOPY with TUBAL PARTIAL SALPINGECTOMY (Bilateral) as a surgical intervention.  The patient's history has been reviewed, patient examined, no change in status, stable for surgery.  I have reviewed the patient's chart and labs.  Questions were answered to the patient's satisfaction.     Letitia Libra

## 2020-08-28 NOTE — Transfer of Care (Signed)
Immediate Anesthesia Transfer of Care Note  Patient: Mandy Hansen  Procedure(s) Performed: LAPAROSCOPY with TUBAL PARTIAL SALPINGECTOMY (Bilateral )  Patient Location: PACU  Anesthesia Type:General  Level of Consciousness: awake, alert  and oriented  Airway & Oxygen Therapy: Patient Spontanous Breathing  Post-op Assessment: Report given to RN and Post -op Vital signs reviewed and stable  Post vital signs: Reviewed and stable  Last Vitals:  Vitals Value Taken Time  BP 117/66 08/28/20 1721  Temp    Pulse 85 08/28/20 1723  Resp 15 08/28/20 1723  SpO2 100 % 08/28/20 1723  Vitals shown include unvalidated device data.  Last Pain:  Vitals:   08/28/20 1308  TempSrc: Oral  PainSc: 0-No pain         Complications: No complications documented.

## 2020-08-28 NOTE — Anesthesia Preprocedure Evaluation (Signed)
Anesthesia Evaluation  Patient identified by MRN, date of birth, ID band Patient awake    Reviewed: Allergy & Precautions, H&P , NPO status , reviewed documented beta blocker date and time   Airway Mallampati: II  TM Distance: >3 FB Neck ROM: full    Dental  (+) Poor Dentition, Chipped, Missing Most upper teeth missing:   Pulmonary Current Smoker and Patient abstained from smoking.,    Pulmonary exam normal        Cardiovascular Normal cardiovascular exam     Neuro/Psych    GI/Hepatic GERD  Medicated and Controlled,  Endo/Other    Renal/GU      Musculoskeletal   Abdominal   Peds  Hematology   Anesthesia Other Findings Past Medical History: 2013: Dysplasia of cervix     Comment:  dyplasia has resolved since birth of son in 2013. No date: GERD (gastroesophageal reflux disease) 2004: Psoriasis of scalp     Comment:  mainly scalp, uses sun for treatment Past Surgical History: No date: ABDOMINAL SURGERY No date: APPENDECTOMY No date: CESAREAN SECTION 2013: CESAREAN SECTION; N/A 05/23/2015: CYSTOSCOPY; N/A     Comment:  Procedure: CYSTOSCOPY;  Surgeon: Nadara Mustard, MD;                Location: [facility] ORS;  Service: Gynecology;  Laterality:               N/A; 07/02/2015: ESOPHAGOGASTRODUODENOSCOPY; N/A     Comment:  Procedure: ESOPHAGOGASTRODUODENOSCOPY (EGD) looking in               the esophagus stomach and upper small intestine with a               lighted tube to evaluate and treat;  Surgeon: Christena Deem, MD;  Location: [facility] ENDOSCOPY;  Service:               Endoscopy;  Laterality: N/A; 10/11/2017: INCISION AND DRAINAGE ABSCESS; N/A     Comment:  Procedure: INCISION AND DRAINAGE SKENE CYST;  Surgeon:               Natale Milch, MD;  Location: [facility] ORS;  Service:              Gynecology;  Laterality: N/A; 05/23/2015: LAPAROSCOPY; N/A     Comment:  Procedure: LAPAROSCOPY DIAGNOSTIC;   Surgeon: Nadara Mustard, MD;  Location: [facility] ORS;  Service: Gynecology;                Laterality: N/A;   Reproductive/Obstetrics                             Anesthesia Physical Anesthesia Plan  ASA: II  Anesthesia Plan: General   Post-op Pain Management:    Induction: Intravenous  PONV Risk Score and Plan: Ondansetron and Treatment may vary due to age or medical condition  Airway Management Planned: Oral ETT  Additional Equipment:   Intra-op Plan:   Post-operative Plan: Extubation in OR  Informed Consent: I have reviewed the patients History and Physical, chart, labs and discussed the procedure including the risks, benefits and alternatives for the proposed anesthesia with the patient or authorized representative who has indicated his/her understanding and acceptance.     Dental Advisory Given  Plan  Discussed with: CRNA  Anesthesia Plan Comments:         Anesthesia Quick Evaluation

## 2020-08-28 NOTE — Discharge Instructions (Signed)
Laparoscopy, Care After This sheet gives you information about how to care for yourself after your procedure. Your health care provider may also give you more specific instructions. If you have problems or questions, contact your health care provider. What can I expect after the procedure? After the procedure, it is common to have:  Mild discomfort in the abdomen.  Sore throat. Women who have laparoscopy with pelvic examination may have mild cramping and fluid coming from the vagina for a few days after the procedure. Follow these instructions at home: Medicines  Take over-the-counter and prescription medicines only as told by your health care provider.  If you were prescribed an antibiotic medicine, take it as told by your health care provider. Do not stop taking the antibiotic even if you start to feel better. Driving  Do not drive for 24 hours if you were given a medicine to help you relax (sedative) during your procedure.  Do not drive or use heavy machinery while taking prescription pain medicine. Bathing  Do not take baths, swim, or use a hot tub until your health care provider approves. You may take showers. Incision care   Follow instructions from your health care provider about how to take care of your incisions. Make sure you: ? Wash your hands with soap and water before you change your bandage (dressing). If soap and water are not available, use hand sanitizer. ? Change your dressing as told by your health care provider. ? Leave stitches (sutures), skin glue, or adhesive strips in place. These skin closures may need to stay in place for 2 weeks or longer. If adhesive strip edges start to loosen and curl up, you may trim the loose edges. Do not remove adhesive strips completely unless your health care provider tells you to do that.  Check your incision areas every day for signs of infection. Check for: ? Redness, swelling, or pain. ? Fluid or blood. ? Warmth. ? Pus or a  bad smell. Activity  Return to your normal activities as told by your health care provider. Ask your health care provider what activities are safe for you.  Do not lift anything that is heavier than 10 lb (4.5 kg), or the limit that you are told, until your health care provider says that it is safe. General instructions  To prevent or treat constipation while you are taking prescription pain medicine, your health care provider may recommend that you: ? Drink enough fluid to keep your urine pale yellow. ? Take over-the-counter or prescription medicines. ? Eat foods that are high in fiber, such as fresh fruits and vegetables, whole grains, and beans. ? Limit foods that are high in fat and processed sugars, such as fried and sweet foods.  Do not use any products that contain nicotine or tobacco, such as cigarettes and e-cigarettes. If you need help quitting, ask your health care provider.  Keep all follow-up visits as told by your health care provider. This is important. Contact a health care provider if:  You develop shoulder pain.  You feel lightheaded or faint.  You are unable to pass gas or have a bowel movement.  You feel nauseous or you vomit.  You develop a rash.  You have redness, swelling, or pain around any incision.  You have fluid or blood coming from any incision.  Any incision feels warm to the touch.  You have pus or a bad smell coming from any incision.  You have a fever or chills. Get help right   away if:  You have severe pain.  You have vomiting that does not go away.  You have heavy bleeding from the vagina.  Any incision opens.  You have trouble breathing.  You have chest pain. Summary  After the procedure, it is common to have mild discomfort in the abdomen and a sore throat.  Check your incision areas every day for signs of infection.  Return to your normal activities as told by your health care provider. Ask your health care provider what  activities are safe for you. This information is not intended to replace advice given to you by your health care provider. Make sure you discuss any questions you have with your health care provider. Document Revised: 08/26/2017 Document Reviewed: 03/09/2017 Elsevier Patient Education  2020 [employer]   AMBULATORY SURGERY  DISCHARGE INSTRUCTIONS   1) The drugs that you were given will stay in your system until tomorrow so for the next 24 hours you should not:  A) Drive an automobile B) Make any legal decisions C) Drink any alcoholic beverage   2) You may resume regular meals tomorrow.  Today it is better to start with liquids and gradually work up to solid foods.  You may eat anything you prefer, but it is better to start with liquids, then soup and crackers, and gradually work up to solid foods.   3) Please notify your doctor immediately if you have any unusual bleeding, trouble breathing, redness and pain at the surgery site, drainage, fever, or pain not relieved by medication.  4) Your post-operative visit with Dr.                                     is: Date:                        Time:    Please call to schedule your post-operative visit.  5) Additional Instructions:

## 2020-08-28 NOTE — Anesthesia Procedure Notes (Signed)
Procedure Name: Intubation Performed by: Fletcher-Harrison, Latrail Pounders, CRNA Pre-anesthesia Checklist: Patient identified, Emergency Drugs available, Suction available and Patient being monitored Patient Re-evaluated:Patient Re-evaluated prior to induction Oxygen Delivery Method: Circle system utilized Preoxygenation: Pre-oxygenation with 100% oxygen Induction Type: IV induction Ventilation: Mask ventilation without difficulty Laryngoscope Size: McGraph and 3 Grade View: Grade I Tube type: Oral Tube size: 6.5 mm Number of attempts: 1 Airway Equipment and Method: Stylet and Oral airway Placement Confirmation: ETT inserted through vocal cords under direct vision,  positive ETCO2,  breath sounds checked- equal and bilateral and CO2 detector Secured at: 21 cm Tube secured with: Tape Dental Injury: Teeth and Oropharynx as per pre-operative assessment        

## 2020-08-28 NOTE — Progress Notes (Signed)
Urine poct pregnancy is negative. Issues with glucometer entering results

## 2020-08-29 ENCOUNTER — Encounter: Payer: Self-pay | Admitting: Obstetrics & Gynecology

## 2020-08-29 NOTE — Anesthesia Postprocedure Evaluation (Signed)
Anesthesia Post Note  Patient: Mandy Hansen  Procedure(s) Performed: LAPAROSCOPY with TUBAL PARTIAL SALPINGECTOMY (Bilateral )  Patient location during evaluation: PACU Anesthesia Type: General Level of consciousness: awake and alert Pain management: pain level controlled Vital Signs Assessment: post-procedure vital signs reviewed and stable Respiratory status: spontaneous breathing, nonlabored ventilation and respiratory function stable Cardiovascular status: blood pressure returned to baseline and stable Postop Assessment: no apparent nausea or vomiting Anesthetic complications: no   No complications documented.   Last Vitals:  Vitals:   08/28/20 1745 08/28/20 1751  BP: 132/81 121/83  Pulse: 89 83  Resp: 18 (!) 26  Temp:  36.4 C  SpO2: 99% 97%    Last Pain:  Vitals:   08/28/20 1751  TempSrc:   PainSc: 2                  Karleen Hampshire

## 2020-09-01 LAB — SURGICAL PATHOLOGY

## 2020-09-09 ENCOUNTER — Ambulatory Visit: Payer: Medicaid Other | Admitting: Obstetrics & Gynecology

## 2020-09-16 ENCOUNTER — Ambulatory Visit: Payer: Medicaid Other | Admitting: Obstetrics & Gynecology

## 2020-11-26 ENCOUNTER — Ambulatory Visit: Admission: RE | Admit: 2020-11-26 | Payer: Medicaid Other | Source: Ambulatory Visit

## 2020-11-26 ENCOUNTER — Other Ambulatory Visit: Payer: Self-pay

## 2020-11-26 ENCOUNTER — Emergency Department
Admission: EM | Admit: 2020-11-26 | Discharge: 2020-11-26 | Disposition: A | Discharge status: Home / Self Care | Payer: Medicaid Other | Attending: Student in an Organized Health Care Education/Training Program | Admitting: Student in an Organized Health Care Education/Training Program

## 2020-11-26 DIAGNOSIS — F17210 Nicotine dependence, cigarettes, uncomplicated: Secondary | ICD-10-CM | POA: Insufficient documentation

## 2020-11-26 DIAGNOSIS — R0781 Pleurodynia: Secondary | ICD-10-CM | POA: Diagnosis present

## 2020-11-26 DIAGNOSIS — K219 Gastro-esophageal reflux disease without esophagitis: Secondary | ICD-10-CM | POA: Insufficient documentation
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
## 2021-01-26 ENCOUNTER — Encounter: Payer: Self-pay | Admitting: Obstetrics & Gynecology

## 2021-01-26 ENCOUNTER — Other Ambulatory Visit: Payer: Self-pay

## 2021-01-26 ENCOUNTER — Emergency Department
Admission: EM | Admit: 2021-01-26 | Discharge: 2021-01-26 | Disposition: A | Discharge status: Home / Self Care | Payer: Medicaid Other | Attending: Emergency Medicine | Admitting: Emergency Medicine

## 2021-01-26 ENCOUNTER — Ambulatory Visit (INDEPENDENT_AMBULATORY_CARE_PROVIDER_SITE_OTHER): Payer: Medicaid Other | Admitting: Obstetrics & Gynecology

## 2021-01-26 VITALS — BP 130/90 | Ht 63.0 in | Wt 179.0 lb

## 2021-01-26 DIAGNOSIS — N3001 Acute cystitis with hematuria: Secondary | ICD-10-CM

## 2021-01-26 DIAGNOSIS — B9689 Other specified bacterial agents as the cause of diseases classified elsewhere: Secondary | ICD-10-CM | POA: Insufficient documentation

## 2021-01-26 DIAGNOSIS — F17210 Nicotine dependence, cigarettes, uncomplicated: Secondary | ICD-10-CM | POA: Insufficient documentation

## 2021-01-26 DIAGNOSIS — R102 Pelvic and perineal pain: Secondary | ICD-10-CM | POA: Diagnosis not present

## 2021-01-26 DIAGNOSIS — R300 Dysuria: Secondary | ICD-10-CM | POA: Diagnosis present

## 2021-01-26 DIAGNOSIS — N390 Urinary tract infection, site not specified: Secondary | ICD-10-CM | POA: Diagnosis not present

## 2021-01-26 LAB — URINALYSIS, COMPLETE (UACMP) WITH MICROSCOPIC
Bilirubin Urine: NEGATIVE
Glucose, UA: NEGATIVE mg/dL
Ketones, ur: NEGATIVE mg/dL
Nitrite: NEGATIVE
Protein, ur: 100 mg/dL — AB
Specific Gravity, Urine: 1.012 (ref 1.005–1.030)
Squamous Epithelial / HPF: NONE SEEN (ref 0–5)
WBC, UA: >50 WBC/hpf — ABNORMAL HIGH (ref 0–5)
pH: 6 (ref 5.0–8.0)

## 2021-01-26 LAB — CBC
HCT: 40 % (ref 36.0–46.0)
Hemoglobin: 12.9 g/dL (ref 12.0–15.0)
MCH: 31.1 pg (ref 26.0–34.0)
MCHC: 32.3 g/dL (ref 30.0–36.0)
MCV: 96.4 fL (ref 80.0–100.0)
Platelets: 250 10*3/uL (ref 150–400)
RBC: 4.15 MIL/uL (ref 3.87–5.11)
RDW: 12.7 % (ref 11.5–15.5)
WBC: 13.5 10*3/uL — ABNORMAL HIGH (ref 4.0–10.5)
nRBC: 0 % (ref 0.0–0.2)

## 2021-01-26 LAB — COMPREHENSIVE METABOLIC PANEL
ALT: 14 U/L (ref 0–44)
AST: 20 U/L (ref 15–41)
Albumin: 3.9 g/dL (ref 3.5–5.0)
Alkaline Phosphatase: 63 U/L (ref 38–126)
Anion gap: 7 (ref 5–15)
BUN: 15 mg/dL (ref 6–20)
CO2: 25 mmol/L (ref 22–32)
Calcium: 9 mg/dL (ref 8.9–10.3)
Chloride: 107 mmol/L (ref 98–111)
Creatinine, Ser: 0.69 mg/dL (ref 0.44–1.00)
GFR, Estimated: >60 mL/min (ref >60)
Glucose, Bld: 126 mg/dL — ABNORMAL HIGH (ref 70–99)
Potassium: 3.9 mmol/L (ref 3.5–5.1)
Sodium: 139 mmol/L (ref 135–145)
Total Bilirubin: 0.5 mg/dL (ref 0.3–1.2)
Total Protein: 6.9 g/dL (ref 6.5–8.1)

## 2021-01-26 LAB — POC URINE PREG, ED: Preg Test, Ur: NEGATIVE

## 2021-01-26 MED ORDER — OXYCODONE-ACETAMINOPHEN 5-325 MG PO TABS: 1.0000 | ORAL_TABLET | ORAL | 0 refills | Status: DC | PRN

## 2021-01-26 MED ORDER — PHENAZOPYRIDINE HCL 200 MG PO TABS: 200.0000 mg | ORAL_TABLET | Freq: Three times a day (TID) | ORAL | 0 refills | Status: DC | PRN

## 2021-01-26 MED ORDER — CEPHALEXIN 500 MG PO CAPS
500.0000 mg | ORAL_CAPSULE | Freq: Once | ORAL | Status: AC
  Administered 2021-01-26: 500 mg via ORAL
  Filled 2021-01-26: qty 1

## 2021-01-26 MED ORDER — HYDROCODONE-ACETAMINOPHEN 5-325 MG PO TABS: 1.0000 | ORAL_TABLET | Freq: Four times a day (QID) | ORAL | 0 refills | Status: DC | PRN

## 2021-01-26 MED ORDER — CEPHALEXIN 500 MG PO CAPS: 500.0000 mg | ORAL_CAPSULE | Freq: Four times a day (QID) | ORAL | 0 refills | Status: DC

## 2021-01-26 MED ORDER — PHENAZOPYRIDINE HCL 200 MG PO TABS
200.0000 mg | ORAL_TABLET | Freq: Once | ORAL | Status: AC
  Administered 2021-01-26: 200 mg via ORAL
  Filled 2021-01-26: qty 1

## 2021-01-26 MED ORDER — HYDROCODONE-ACETAMINOPHEN 5-325 MG PO TABS
1.0000 | ORAL_TABLET | Freq: Once | ORAL | Status: AC
  Administered 2021-01-26: 1 via ORAL
  Filled 2021-01-26: qty 1

## 2021-01-26 MED ORDER — CEPHALEXIN 500 MG PO CAPS: 500.0000 mg | ORAL_CAPSULE | Freq: Three times a day (TID) | ORAL | 0 refills | Status: DC

## 2021-01-26 NOTE — ED Notes (Signed)
Pt states she was seen yesterday [facility] and diagnosed with a cyst on the bladder. Was advised if the pain got worse to come to ED. Pt states pain is stabbing in nature, radiates from left side to lower ABD and into groin area.

## 2021-01-26 NOTE — ED Triage Notes (Signed)
Pt states pelvic pain that radiates to left "side" for several days. Pt denies known hematuria, vaginal discharge, fever, vomiting or diarrhea. Pt states she has had nausea. Pt states was recently diagnosed with "a cyst on my bladder".

## 2021-01-26 NOTE — Progress Notes (Signed)
HPI:      [name] is a 30 y.o. G1P1001 who LMP was Patient's last menstrual period was 12/28/2020., presents today for a problem visit.    Urinary Tract Infection: Patient complains of suprapubic pressure and pain radiating to vagina and to back' . She has had symptoms for a few days. Patient also complains of no other sx's. Patient denies fever and vaginal discharge. Patient does not have a history of recurrent UTI.  Patient does not have a history of pyelonephritis. She was seen by ER Sunday w UA results suggestive of UTI. She was also told she may have vaginal (anterior wall, where bladder is) cyst and has had procedure for this in past.  PMHx: She  has a past medical history of Dysplasia of cervix (2013), GERD (gastroesophageal reflux disease), and Psoriasis of scalp (2004). Also,  has a past surgical history that includes Cesarean section; Cesarean section (N/A, 2013); laparoscopy (N/A, 05/23/2015); Cystoscopy (N/A, 05/23/2015); Esophagogastroduodenoscopy (N/A, 07/02/2015); Abdominal surgery; Appendectomy; Incision and drainage abscess (N/A, 10/11/2017); and Laparoscopic bilateral salpingectomy (Bilateral, 08/28/2020)., family history includes Cancer in her maternal aunt, maternal grandmother, and mother.,  reports that she has been smoking cigarettes. She has been smoking about 0.50 packs per day. She has never used smokeless tobacco. She reports current alcohol use. She reports current drug use. Drug: Marijuana.  She has a current medication list which includes the following prescription(s): buspirone, gabapentin, pantoprazole, cephalexin, oxycodone-acetaminophen, and phenazopyridine. Also, is allergic to norethindrone-eth estradiol and tramadol.  Review of Systems  All other systems reviewed and are negative.   Objective: BP 130/90   Ht 5\' 3"  (1.6 m)   Wt 179 lb (81.2 kg)   LMP 12/28/2020   BMI 31.71 kg/m  Physical Exam Constitutional:      General: She is not in acute  distress.    Appearance: She is well-developed.  Genitourinary:     Bladder normal.     No lesions in the vagina.     Right Labia: No rash or tenderness.    Left Labia: No tenderness or rash.    No vaginal erythema or bleeding.     No vaginal prolapse present.    No vaginal atrophy present.     Right Adnexa: not tender and no mass present.    Left Adnexa: not tender and no mass present.    No cervical motion tenderness, discharge, polyp or nabothian cyst.     Uterus is not enlarged.     No uterine mass detected.    Uterus is midaxial.     Bladder is not tender and masses not present.      Bladder exam comments: No cystocele nor cyst.     Pelvic exam was performed with patient in the lithotomy position.  HENT:     Head: Normocephalic and atraumatic.     Nose: Nose normal.  Abdominal:     General: There is no distension.     Palpations: Abdomen is soft.     Tenderness: There is no abdominal tenderness.  Musculoskeletal:        General: Normal range of motion.  Neurological:     Mental Status: She is alert and oriented to person, place, and time.     Cranial Nerves: No cranial nerve deficit.  Skin:    General: Skin is warm and dry.  Psychiatric:        Attention and Perception: Attention normal.        Mood and Affect:  Mood and affect normal.        Speech: Speech normal.        Behavior: Behavior normal.        Thought Content: Thought content normal.        Judgment: Judgment normal.     ASSESSMENT/PLAN:   Acute cystitis  Problem List Items Addressed This Visit    Visit Diagnoses    Pelvic pain    -  Primary   Acute cystitis with hematuria        Cont plan to treat w Keflex    Also pyridium and Percocet (Rx #15 gv) No sign of vaginal wall cyst or cystocele or prolapse or uterine mass or cervical mass  Annamarie Major, MD, [facility], [facility] 01/26/2021  3:25 PM

## 2021-01-26 NOTE — Discharge Instructions (Signed)
1.  Take antibiotic as prescribed (Keflex 500mg  3 times daily x7 days). 2.  You may take Ibuprofen as needed for discomfort; Norco as needed for more severe pain. 3.  You may take Pyridium for urinary discomfort. 4.  Drink plenty of fluids daily. 5.  Return to the ER for worsening symptoms, persistent vomiting, fever, difficulty breathing or other concerns.

## 2021-01-26 NOTE — ED Provider Notes (Signed)
[facility] Emergency Department Provider Note   ____________________________________________   Event Date/Time   First MD Initiated Contact with Patient 01/26/21 [phone]     (approximate)  I have reviewed the triage vital signs and the nursing notes.   HISTORY  Chief Complaint Abdominal Pain and Pelvic Pain    HPI Mandy Hansen is a 30 y.o. female who presents to the ED from home with a chief complaint of dysuria and bladder pain. Patient diagnosed with a cyst on her bladder at her PCP yesterday. Reports UA was normal. Had a pelvic exam performed. Presents with bladder pain radiating to the left side. Denies fever, chills, cough, chest pain, shortness of breath, vomiting or diarrhea.     Past Medical History:  Diagnosis Date  . Dysplasia of cervix 2013   dyplasia has resolved since birth of son in 2013.  [name] GERD (gastroesophageal reflux disease)   . Psoriasis of scalp 2004   mainly scalp, uses sun for treatment    Patient Active Problem List   Diagnosis Date Noted  . Admission for sterilization 08/28/2020  . 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 Epigastric pain 06/30/2015    Past Surgical History:  Procedure Laterality Date  . ABDOMINAL SURGERY    . APPENDECTOMY    . CESAREAN SECTION    . CESAREAN SECTION N/A 2013  . CYSTOSCOPY N/A 05/23/2015   Procedure: CYSTOSCOPY;  Surgeon: Nadara Mustard, MD;  Location: [facility] ORS;  Service: Gynecology;  Laterality: N/A;  . ESOPHAGOGASTRODUODENOSCOPY N/A 07/02/2015   Procedure: ESOPHAGOGASTRODUODENOSCOPY (EGD) looking in the esophagus stomach and upper small intestine with a lighted tube to evaluate and treat;  Surgeon: Christena Deem, MD;  Location: [facility] ENDOSCOPY;  Service: Endoscopy;  Laterality: N/A;  . INCISION AND DRAINAGE ABSCESS N/A 10/11/2017   Procedure: INCISION AND DRAINAGE SKENE CYST;  Surgeon: Natale Milch, MD;  Location: [facility] ORS;  Service: Gynecology;  Laterality: N/A;  .  LAPAROSCOPIC BILATERAL SALPINGECTOMY Bilateral 08/28/2020   Procedure: LAPAROSCOPY with TUBAL PARTIAL SALPINGECTOMY;  Surgeon: Nadara Mustard, MD;  Location: [facility] ORS;  Service: Gynecology;  Laterality: Bilateral;  . LAPAROSCOPY N/A 05/23/2015   Procedure: LAPAROSCOPY DIAGNOSTIC;  Surgeon: Nadara Mustard, MD;  Location: [facility] ORS;  Service: Gynecology;  Laterality: N/A;    Prior to Admission medications   Medication Sig Start Date End Date Taking? Authorizing Provider  cephALEXin (KEFLEX) 500 MG capsule Take 1 capsule (500 mg total) by mouth 3 (three) times daily. 01/26/21  Yes Irean Hong, MD  HYDROcodone-acetaminophen (NORCO) 5-325 MG tablet Take 1 tablet by mouth every 6 (six) hours as needed for moderate pain. 01/26/21  Yes Irean Hong, MD  phenazopyridine (PYRIDIUM) 200 MG tablet Take 1 tablet (200 mg total) by mouth 3 (three) times daily as needed for pain. 01/26/21  Yes Irean Hong, MD  acetaminophen (TYLENOL) 500 MG tablet Take 1,000-1,500 mg by mouth every 8 (eight) hours as needed for moderate pain.    [provider]  busPIRone (BUSPAR) 15 MG tablet Take 15 mg by mouth 2 (two) times daily.  11/13/19   [provider]  calcium carbonate (TUMS - DOSED IN MG ELEMENTAL CALCIUM) 500 MG chewable tablet Chew 1 tablet by mouth daily as needed for indigestion or heartburn.    [provider]  gabapentin (NEURONTIN) 300 MG capsule Take 300 mg by mouth 3 (three) times daily.     [provider]  oxyCODONE-acetaminophen (PERCOCET/ROXICET) 5-325  MG tablet Take 1 tablet by mouth every 4 (four) hours as needed for moderate pain. 08/28/20   Nadara Mustard, MD  pantoprazole (PROTONIX) 40 MG tablet Take 40 mg by mouth daily.  07/01/15   [provider]    Allergies Norethindrone-eth estradiol and Tramadol  Family History  Problem Relation Age of Onset  . Cancer Mother   . Cancer Maternal Aunt   . Cancer Maternal Grandmother     Social History Social  History   Tobacco Use  . Smoking status: Current Every Day Smoker    Packs/day: 0.50    Types: Cigarettes  . Smokeless tobacco: Never Used  Vaping Use  . Vaping Use: Never used  Substance Use Topics  . Alcohol use: Yes    Alcohol/week: 0.0 standard drinks    Comment: occasionally  . Drug use: Yes    Types: Marijuana    Comment: Once every 3 months    Review of Systems  Constitutional: No fever/chills Eyes: No visual changes. ENT: No sore throat. Cardiovascular: Denies chest pain. Respiratory: Denies shortness of breath. Gastrointestinal: Positive for abdominal pain.  Positive for nausea, no vomiting.  No diarrhea.  No constipation. Genitourinary: Positive for dysuria. Musculoskeletal: Negative for back pain. Skin: Negative for rash. Neurological: Negative for headaches, focal weakness or numbness.   ____________________________________________   PHYSICAL EXAM:  VITAL SIGNS: ED Triage Vitals  Enc Vitals Group     BP 01/26/21 0009 (!) 134/91     Pulse Rate 01/26/21 0009 92     Resp 01/26/21 0009 16     Temp 01/26/21 0009 98.7 F (37.1 C)     Temp Source 01/26/21 0009 Oral     SpO2 01/26/21 0009 100 %     Weight 01/26/21 0010 180 lb (81.6 kg)     Height 01/26/21 0010 5\' 3"  (1.6 m)     Head Circumference --      Peak Flow --      Pain Score 01/26/21 0010 8     Pain Loc --      Pain Edu? --      Excl. in GC? --     Constitutional: Alert and oriented. Well appearing and in no acute distress. Eyes: Conjunctivae are normal. PERRL. EOMI. Head: Atraumatic. Nose: No congestion/rhinnorhea. Mouth/Throat: Mucous membranes are moist.   Neck: No stridor.   Cardiovascular: Normal rate, regular rhythm. Grossly normal heart sounds.  Good peripheral circulation. Respiratory: Normal respiratory effort.  No retractions. Lungs CTAB. Gastrointestinal: Soft and mildly tender to palpation suprapubic area without rebound or guard. No distention. No abdominal bruits. No CVA  tenderness. Musculoskeletal: No lower extremity tenderness nor edema.  No joint effusions. Neurologic:  Normal speech and language. No gross focal neurologic deficits are appreciated. No gait instability. Skin:  Skin is warm, dry and intact. No rash noted.  No vesicles. Psychiatric: Mood and affect are normal. Speech and behavior are normal.  ____________________________________________   LABS (all labs ordered are listed, but only abnormal results are displayed)  Labs Reviewed  COMPREHENSIVE METABOLIC PANEL - Abnormal; Notable for the following components:      Result Value   Glucose, Bld 126 (*)    All other components within normal limits  CBC - Abnormal; Notable for the following components:   WBC 13.5 (*)    All other components within normal limits  URINALYSIS, COMPLETE (UACMP) WITH MICROSCOPIC - Abnormal; Notable for the following components:   Color, Urine YELLOW (*)    APPearance  CLOUDY (*)    Hgb urine dipstick LARGE (*)    Protein, ur 100 (*)    Leukocytes,Ua LARGE (*)    WBC, UA >50 (*)    Bacteria, UA RARE (*)    All other components within normal limits  URINE CULTURE  POC URINE PREG, ED   ____________________________________________  EKG  None ____________________________________________  RADIOLOGY I, Shevette Bess J, personally viewed and evaluated these images (plain radiographs) as part of my medical decision making, as well as reviewing the written report by the radiologist.  ED MD interpretation: None  Official radiology report(s): No results found.  ____________________________________________   PROCEDURES  Procedure(s) performed (including Critical Care):  Procedures   ____________________________________________   INITIAL IMPRESSION / ASSESSMENT AND PLAN / ED COURSE  As part of my medical decision making, I reviewed the following data within the electronic MEDICAL RECORD NUMBER Nursing notes reviewed and incorporated, Labs reviewed, Old chart  reviewed, Notes from prior ED visits and [place] Controlled Substance Database     30 year old female presenting with dysuria, suprapubic abdominal pain; recent diagnosis of bladder cyst. Differential diagnosis includes, but is not limited to, ovarian cyst, ovarian torsion, acute appendicitis, diverticulitis, urinary tract infection/pyelonephritis, endometriosis, bowel obstruction, colitis, renal colic, gastroenteritis, hernia, fibroids, endometriosis, pregnancy related pain including ectopic pregnancy, etc.  Laboratory results demonstrate mild leukocytosis, large leukocyte UTI.  Patient declines further evaluation with imaging study to evaluate for kidney stones.  Just wants pain medicines and antibiotics.  Strict return precautions given.  Patient verbalizes understanding agrees with plan of care.      ____________________________________________   FINAL CLINICAL IMPRESSION(S) / ED DIAGNOSES  Final diagnoses:  Lower urinary tract infectious disease  Dysuria     ED Discharge Orders         Ordered    cephALEXin (KEFLEX) 500 MG capsule  3 times daily        01/26/21 0058    HYDROcodone-acetaminophen (NORCO) 5-325 MG tablet  Every 6 hours PRN        01/26/21 0058    phenazopyridine (PYRIDIUM) 200 MG tablet  3 times daily PRN        01/26/21 0058          *Please note:  Mandy Hansen was evaluated in Emergency Department on 01/26/2021 for the symptoms described in the history of present illness. She was evaluated in the context of the global COVID-19 pandemic, which necessitated consideration that the patient might be at risk for infection with the SARS-CoV-2 virus that causes COVID-19. Institutional protocols and algorithms that pertain to the evaluation of patients at risk for COVID-19 are in a state of rapid change based on information released by regulatory bodies including the CDC and federal and state organizations. These policies and algorithms were followed during the patient's care  in the ED.  Some ED evaluations and interventions may be delayed as a result of limited staffing during and the pandemic.*   Note:  This document was prepared using Dragon voice recognition software and may include unintentional dictation errors.   Irean Hong, MD 01/26/21 [phone]

## 2021-01-28 LAB — URINE CULTURE: Culture: >=100000 — AB

## 2021-02-13 ENCOUNTER — Ambulatory Visit: Payer: Medicaid Other | Admitting: Obstetrics & Gynecology

## 2021-02-19 ENCOUNTER — Ambulatory Visit: Payer: Medicaid Other | Admitting: Obstetrics & Gynecology

## 2021-02-20 ENCOUNTER — Ambulatory Visit (INDEPENDENT_AMBULATORY_CARE_PROVIDER_SITE_OTHER): Payer: Medicaid Other | Admitting: Obstetrics & Gynecology

## 2021-02-20 ENCOUNTER — Other Ambulatory Visit (HOSPITAL_COMMUNITY)
Admission: RE | Admit: 2021-02-20 | Discharge: 2021-02-20 | Disposition: A | Discharge status: Home / Self Care | Payer: Medicaid Other | Source: Ambulatory Visit | Attending: Obstetrics & Gynecology | Admitting: Obstetrics & Gynecology

## 2021-02-20 ENCOUNTER — Other Ambulatory Visit: Payer: Self-pay

## 2021-02-20 ENCOUNTER — Encounter: Payer: Self-pay | Admitting: Obstetrics & Gynecology

## 2021-02-20 VITALS — BP 120/80 | Ht 63.0 in | Wt 180.0 lb

## 2021-02-20 DIAGNOSIS — N9419 Other specified dyspareunia: Secondary | ICD-10-CM | POA: Diagnosis present

## 2021-02-20 MED ORDER — REPLENS VA GEL: 1.0000 | VAGINAL | 11 refills | Status: DC

## 2021-02-20 NOTE — Progress Notes (Signed)
Obstetrics & Gynecology Office Visit   Chief Complaint  Patient presents with  . Dyspareunia    History of Present Illness: 30 y.o. G1P1001 presenting for initial evaluation of dyspareunia.  Symptoms onset was several weeks ago and symptoms have been worsening.  The patient is not menopausal.  She is not currently on any HRT or hormonal medications.    Pain is most pronounced with initial penetration.  She denies postcoital spotting.  Last pap smear on 06/2020 was NIL and HR HPV negative.  She denies a history of sexually transmitted infections.  Associated symptoms include some throbbing in the vagina afterwards.  She says pain is mostly vaginal, not deep.  She has no h/o endometrioisis.  She had laparoscopy in 08/2020 (for tubal) and no abnormalities found then.  Periods have been reg since Feb as Depo is now out of her system (since the tubal).  Periods reg and not heavy or painful.  Pt was treated earlier this month for UTI.[name]  She admits to recent antibiotic exposure, denies changes in soaps, detergents coinciding with the onset of her symptoms.  She has not previously self treated or been under treatment by another provider for these symptoms.   ROS  Past Medical History:  Past Medical History:  Diagnosis Date  . Dysplasia of cervix 2013   dyplasia has resolved since birth of son in 2013.  [name] GERD (gastroesophageal reflux disease)   . Psoriasis of scalp 2004   mainly scalp, uses sun for treatment    Past Surgical History:  Past Surgical History:  Procedure Laterality Date  . ABDOMINAL SURGERY    . APPENDECTOMY    . CESAREAN SECTION    . CESAREAN SECTION N/A 2013  . CYSTOSCOPY N/A 05/23/2015   Procedure: CYSTOSCOPY;  Surgeon: Nadara Mustard, MD;  Location: [facility] ORS;  Service: Gynecology;  Laterality: N/A;  . ESOPHAGOGASTRODUODENOSCOPY N/A 07/02/2015   Procedure: ESOPHAGOGASTRODUODENOSCOPY (EGD) looking in the esophagus stomach and upper small intestine with a lighted tube  to evaluate and treat;  Surgeon: Christena Deem, MD;  Location: [facility] ENDOSCOPY;  Service: Endoscopy;  Laterality: N/A;  . INCISION AND DRAINAGE ABSCESS N/A 10/11/2017   Procedure: INCISION AND DRAINAGE SKENE CYST;  Surgeon: Natale Milch, MD;  Location: [facility] ORS;  Service: Gynecology;  Laterality: N/A;  . LAPAROSCOPIC BILATERAL SALPINGECTOMY Bilateral 08/28/2020   Procedure: LAPAROSCOPY with TUBAL PARTIAL SALPINGECTOMY;  Surgeon: Nadara Mustard, MD;  Location: [facility] ORS;  Service: Gynecology;  Laterality: Bilateral;  . LAPAROSCOPY N/A 05/23/2015   Procedure: LAPAROSCOPY DIAGNOSTIC;  Surgeon: Nadara Mustard, MD;  Location: [facility] ORS;  Service: Gynecology;  Laterality: N/A;    Gynecologic History: Patient's last menstrual period was 01/28/2021.  Obstetric History: G1P1001  Family History:  Family History  Problem Relation Age of Onset  . Cancer Mother   . Cancer Maternal Aunt   . Cancer Maternal Grandmother     Social History:  Social History   Socioeconomic History  . Marital status: Single    Spouse name: Not on file  . Number of children: Not on file  . Years of education: Not on file  . Highest education level: Not on file  Occupational History  . Not on file  Tobacco Use  . Smoking status: Current Every Day Smoker    Packs/day: 0.50    Types: Cigarettes  . Smokeless tobacco: Never Used  Vaping Use  . Vaping Use: Never used  Substance and Sexual Activity  .  Alcohol use: Yes    Alcohol/week: 0.0 standard drinks    Comment: occasionally  . Drug use: Yes    Types: Marijuana    Comment: Once every 3 months  . Sexual activity: Yes    Birth control/protection: Surgical  Other Topics Concern  . Not on file  Social History Narrative  . Not on file   Social Determinants of Health   Financial Resource Strain: Not on file  Food Insecurity: Not on file  Transportation Needs: Not on file  Physical Activity: Not on file  Stress: Not on file  Social  Connections: Not on file  Intimate Partner Violence: Not on file    Allergies:  Allergies  Allergen Reactions  . Norethindrone-Eth Estradiol Nausea And Vomiting    Felt hot   . Tramadol Other (See Comments)    headaches    Medications: Prior to Admission medications   Medication Sig Start Date End Date Taking? Authorizing Provider  busPIRone (BUSPAR) 15 MG tablet Take 15 mg by mouth 2 (two) times daily.  11/13/19  Yes [provider]  gabapentin (NEURONTIN) 300 MG capsule Take 300 mg by mouth 3 (three) times daily.    Yes [provider]  pantoprazole (PROTONIX) 40 MG tablet Take 40 mg by mouth daily.  07/01/15  Yes [provider]  Vaginal Lubricant (REPLENS) GEL Place 1 Applicatorful vaginally 2 (two) times a week. 02/23/21  Yes Nadara Mustard, MD    Physical Exam Blood pressure 120/80, height 5\' 3"  (1.6 m), weight 180 lb (81.6 kg), last menstrual period 01/28/2021.  Patient's last menstrual period was 01/28/2021.  General: NAD HEENT: normocephalic, anicteric Thyroid: no enlargement, no palpable nodules Pulmonary: No increased work of breathing Cardiovascular: RRR, distal pulses 2+ Abdomen: NABS, soft, non-tender, non-distended.  Umbilicus without lesions.  No hepatomegaly, splenomegaly or masses palpable. No evidence of hernia  Genitourinary:  External: Normal external female genitalia.  Normal urethral meatus, normal  Bartholin's and Skene's glands.    Vagina: Normal vaginal mucosa, no evidence of prolapse.    Cervix: Grossly normal in appearance, no bleeding  Uterus: Non-enlarged, mobile, normal contour.  No CMT  Adnexa: ovaries non-enlarged, no adnexal masses  Rectal: deferred  Lymphatic: no evidence of inguinal lymphadenopathy Extremities: no edema, erythema, or tenderness Neurologic: Grossly intact Psychiatric: mood appropriate, affect full  Female chaperone present for pelvic  portions of the physical exam  Assessment: 30 y.o.  G1P1001  Plan: Problem List Items Addressed This Visit     Dyspareunia due to medical condition in female    -  Primary/ New   Relevant Medications   Vaginal Lubricant (REPLENS) GEL (Start on 02/23/2021)   Other Relevant Orders   Cervicovaginal ancillary only    Monitor as should not be long term diagnosis if related to recent infection or current BV (test pending) Replens as supplement to help w moisturization Monitor cycles off of contraceptives, as well as for cyclic sx's  A total of 23 minutes were spent face-to-face with the patient as well as preparation, review, communication, and documentation during this encounter.   02/25/2021, MD, Annamarie Major Ob/Gyn, [facility] 02/20/2021  2:28 PM

## 2021-02-20 NOTE — Patient Instructions (Signed)
Dyspareunia, Female Dyspareunia is pain that is associated with sexual activity. This can affect any part of the genitals or lower abdomen. There are many possible causes of this condition. In some cases, diagnosing the cause of dyspareunia can be difficult. This condition can be mild, moderate, or severe. Depending on the cause, dyspareunia may get better with treatment, but may return (recur) over time. What are the causes? The cause of this condition is not always known. However, problems that affect the vulva, vagina, uterus, and other organs may cause dyspareunia. Common causes of this condition include:  Severe pain and tenderness of the vulva when it is touched (vulvodynia).  Vaginal dryness.  Giving birth.  Infection.  Skin changes or conditions.  Side effects of medicines.  Endometriosis. This is when tissue that is like the lining of the uterus grows on the outside of the uterus.  Psychological conditions. These include depression, anxiety, or traumatic experiences.  Allergic reaction.   What increases the risk? The following factors may make you more likely to develop this condition:  History of physical or sexual trauma.  Some medicines.  No longer having a monthly period (menopause).  Having recently given birth.  Taking baths using soaps that have perfumes. These can cause irritation.  Douching. What are the signs or symptoms? The main symptom of this condition is pain in any part of your genitals or lower abdomen during or after sex. This may include:  Irritation, burning, or stinging sensations in your vulva.  Discomfort when your vulva or surrounding area is touched.  Aching and throbbing pain that may be constant.  Pain that gets worse when something is inserted into your vagina. How is this diagnosed? This condition may be diagnosed based on:  Your symptoms, including where and when your pain occurs.  Your medical history.  A physical exam. A  pelvic exam will most likely be done.  Tests that include ultrasound, blood tests, and tests that check the body for infection.  Imaging tests, such as X-ray, MRI, and CT scan. You may be referred to a health care provider who specializes in women's health (gynecologist). How is this treated? Treatment depends on the cause of your condition and your symptoms. In most cases, you may need to stop sexual activity until your symptoms go away or get better. Treatment may include:  Lubricants, ointments, and creams.  Physical therapy.  Massage therapy.  Hormonal therapy.  Medicines to: ? Prevent or fight infection. ? Relieve pain. ? Help numb the area. ? Treat depression (antidepressants).  Counseling, which may include sex therapy.  Surgery. Follow these instructions at home: Lifestyle  Wear cotton underwear.  Use water-based lubricants as needed during sex. Avoid oil-based lubricants.  Do not use any products that can cause irritation. This may include certain condoms, spermicides, lubricants, soaps, tampons, vaginal sprays, or douches.  Always practice safe sex. Use a condom to prevent sexually transmitted infections (STIs).  Talk freely with your partner about your condition. General instructions  Take or apply over-the-counter and prescription medicines only as told by your health care provider.  Urinate before you have sex.  Consider joining a support group.  Get the results of any tests you have done. Ask your health care provider, or the department that is doing the procedure, when your results will be ready.  Keep all follow-up visits as told by your health care provider. This is important. Contact a health care provider if:  You have vaginal bleeding after having sex.    You develop a lump at the opening of your vagina even if the lump is painless.  You have: ? Abnormal discharge from your vagina. ? Vaginal dryness. ? Itchiness or irritation of your vulva  or vagina. ? A new rash. ? Symptoms that get worse or do not improve with treatment. ? A fever. ? Pain when you urinate. ? Blood in your urine. Get help right away if:  You have severe pain in your abdomen during or shortly after sex.  You pass out after sex. Summary  Dyspareunia is pain that is associated with sexual activity. This can affect any part of the genitals or lower abdomen.  There are many causes of this condition. Treatment depends on the cause and your symptoms. In most cases, you may need to stop sexual activity until your symptoms improve.  Take or apply over-the-counter and prescription medicines only as told by your health care provider.  Contact a health care provider if your symptoms get worse or do not improve with treatment.  Keep all follow-up visits as told by your health care provider. This is important. This information is not intended to replace advice given to you by your health care provider. Make sure you discuss any questions you have with your health care provider. Document Revised: 10/25/2019 Document Reviewed: 11/20/2018 Elsevier Patient Education  2021 [employer]  

## 2021-02-25 LAB — CERVICOVAGINAL ANCILLARY ONLY
Bacterial Vaginitis (gardnerella): NEGATIVE
Candida Glabrata: NEGATIVE
Candida Vaginitis: NEGATIVE
Chlamydia: NEGATIVE
Comment: NEGATIVE
Comment: NEGATIVE
Comment: NEGATIVE
Comment: NEGATIVE
Comment: NEGATIVE
Comment: NORMAL
Neisseria Gonorrhea: NEGATIVE
Trichomonas: NEGATIVE

## 2021-04-04 ENCOUNTER — Other Ambulatory Visit: Payer: Self-pay

## 2021-04-04 ENCOUNTER — Emergency Department
Admission: EM | Admit: 2021-04-04 | Discharge: 2021-04-04 | Disposition: A | Discharge status: Home / Self Care | Payer: Medicaid Other | Attending: Emergency Medicine | Admitting: Emergency Medicine

## 2021-04-04 ENCOUNTER — Encounter: Payer: Self-pay | Admitting: Emergency Medicine

## 2021-04-04 DIAGNOSIS — Z711 Person with feared health complaint in whom no diagnosis is made: Secondary | ICD-10-CM | POA: Diagnosis not present

## 2021-04-04 DIAGNOSIS — T192XXA Foreign body in vulva and vagina, initial encounter: Secondary | ICD-10-CM | POA: Insufficient documentation

## 2021-04-04 DIAGNOSIS — X58XXXA Exposure to other specified factors, initial encounter: Secondary | ICD-10-CM | POA: Insufficient documentation

## 2021-04-04 DIAGNOSIS — F17210 Nicotine dependence, cigarettes, uncomplicated: Secondary | ICD-10-CM | POA: Diagnosis not present

## 2021-04-04 NOTE — ED Provider Notes (Signed)
[facility] Emergency Department Provider Note   ____________________________________________   Event Date/Time   First MD Initiated Contact with Patient 04/04/21 [phone]     (approximate)  I have reviewed the triage vital signs and the nursing notes.   HISTORY  Chief Complaint Foreign Body in Vagina    HPI Mandy Hansen is a 30 y.o. female presents to the ED with possible tampon in the vagina that she has been unable to remove.  Patient states that around 6 PM yesterday she placed a tampon.  She states that later she tried to pull this out and was unable to.  Patient this morning again tried to pull the tampon out and felt as if there was a pulling sensation.  Patient is on the end of her menses.         Past Medical History:  Diagnosis Date   Dysplasia of cervix 2013   dyplasia has resolved since birth of son in 2013.   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   Epigastric pain 06/30/2015    Past Surgical History:  Procedure Laterality Date   ABDOMINAL SURGERY     APPENDECTOMY     CESAREAN SECTION     CESAREAN SECTION N/A 2013   CYSTOSCOPY N/A 05/23/2015   Procedure: CYSTOSCOPY;  Surgeon: Nadara Mustard, MD;  Location: [facility] ORS;  Service: Gynecology;  Laterality: N/A;   ESOPHAGOGASTRODUODENOSCOPY N/A 07/02/2015   Procedure: ESOPHAGOGASTRODUODENOSCOPY (EGD) looking in the esophagus stomach and upper small intestine with a lighted tube to evaluate and treat;  Surgeon: Christena Deem, MD;  Location: [facility] ENDOSCOPY;  Service: Endoscopy;  Laterality: N/A;   INCISION AND DRAINAGE ABSCESS N/A 10/11/2017   Procedure: INCISION AND DRAINAGE SKENE CYST;  Surgeon: Natale Milch, MD;  Location: [facility] ORS;  Service: Gynecology;  Laterality: N/A;    LAPAROSCOPIC BILATERAL SALPINGECTOMY Bilateral 08/28/2020   Procedure: LAPAROSCOPY with TUBAL PARTIAL SALPINGECTOMY;  Surgeon: Nadara Mustard, MD;  Location: [facility] ORS;  Service: Gynecology;  Laterality: Bilateral;   LAPAROSCOPY N/A 05/23/2015   Procedure: LAPAROSCOPY DIAGNOSTIC;  Surgeon: Nadara Mustard, MD;  Location: [facility] ORS;  Service: Gynecology;  Laterality: N/A;    Prior to Admission medications   Medication Sig Start Date End Date Taking? Authorizing Provider  busPIRone (BUSPAR) 15 MG tablet Take 15 mg by mouth 2 (two) times daily.  11/13/19   [provider]  gabapentin (NEURONTIN) 300 MG capsule Take 300 mg by mouth 3 (three) times daily.     [provider]  pantoprazole (PROTONIX) 40 MG tablet Take 40 mg by mouth daily.  07/01/15   [provider]  Vaginal Lubricant (REPLENS) GEL Place 1 Applicatorful vaginally 2 (two) times a week. 02/23/21   Nadara Mustard, MD    Allergies Norethindrone-eth estradiol and Tramadol  Family History  Problem Relation Age of Onset   Cancer Mother    Cancer Maternal Aunt    Cancer Maternal Grandmother     Social History Social History   Tobacco Use   Smoking status: Every Day    Packs/day: 0.50    Pack years: 0.00    Types: Cigarettes   Smokeless tobacco: Never  Vaping Use   Vaping Use: Never used  Substance Use Topics  Alcohol use: Yes    Alcohol/week: 0.0 standard drinks    Comment: occasionally   Drug use: Yes    Types: Marijuana    Comment: Once every 3 months    Review of Systems Constitutional: No fever/chills Eyes: No visual changes. Cardiovascular: Denies chest pain. Respiratory: Denies shortness of breath. Gastrointestinal: No abdominal pain.  No nausea, no vomiting.  No diarrhea.   Genitourinary: Possible retained tampon/vagina. Musculoskeletal: Negative for muscle skeletal pain. Skin: Negative for rash. Neurological: Negative for headaches, focal weakness or  numbness.  ____________________________________________   PHYSICAL EXAM:  VITAL SIGNS: ED Triage Vitals  Enc Vitals Group     BP 04/04/21 0726 121/84     Pulse Rate 04/04/21 0726 89     Resp 04/04/21 0726 20     Temp 04/04/21 0726 98.2 F (36.8 C)     Temp Source 04/04/21 0726 Oral     SpO2 04/04/21 0726 98 %     Weight 04/04/21 0724 180 lb (81.6 kg)     Height 04/04/21 0724 5\' 3"  (1.6 m)     Head Circumference --      Peak Flow --      Pain Score 04/04/21 0724 4     Pain Loc --      Pain Edu? --      Excl. in GC? --     Constitutional: Alert and oriented. Well appearing and in no acute distress. Eyes: Conjunctivae are normal. PERRL. EOMI. Head: Atraumatic. Neck: No stridor.   Cardiovascular: Normal rate, regular rhythm. Grossly normal heart sounds.  Good peripheral circulation. Respiratory: Normal respiratory effort.  No retractions. Lungs CTAB. Gastrointestinal: Soft and nontender. No distention.  Genitourinary: No tampon is noted in the vaginal vault.  Cervix is identified.  Bimanual exam also failed to locate a tampon.  Multiple attempts was made with a vaginal speculum without foreign body. Musculoskeletal: Moves upper and lower extremities with any difficulty.  Normal gait was noted. Neurologic:  Normal speech and language.  Skin:  Skin is warm, dry and intact. No rash noted. Psychiatric: Mood and affect are normal. Speech and behavior are normal.  ____________________________________________   LABS (all labs ordered are listed, but only abnormal results are displayed)  Labs Reviewed - No data to display ____________________________________________  PROCEDURES  Procedure(s) performed (including Critical Care):  Procedures   ____________________________________________   INITIAL IMPRESSION / ASSESSMENT AND PLAN / ED COURSE  As part of my medical decision making, I reviewed the following data within the electronic MEDICAL RECORD NUMBER Notes from prior ED  visits and [place] Controlled Substance Database  30 year old female presents to the ED with concerns of a tampon being stuck in her vagina since yesterday at 6 PM.  She denies any other symptoms.  Just prior to exam patient did go to the restroom and strain to have a bowel movement.  It is quite possible that she also dislodged the tampon at that time.  On bimanual and visual exam there was no tampon to be seen in the vaginal vault.  Patient was made aware.  She is reassured.  She is to follow-up with her PCP if any continued problems. ____________________________________________   FINAL CLINICAL IMPRESSION(S) / ED DIAGNOSES  Final diagnoses:  Feared condition not demonstrated     ED Discharge Orders     None        Note:  This document was prepared using Dragon voice recognition software and may include unintentional dictation errors.~[id number]  Tommi Rumps, PA-C 04/04/21 [id number]    Merwyn Katos, MD 04/05/21 [phone]

## 2021-04-04 NOTE — ED Triage Notes (Signed)
Pt reports has a tampon stuck in her and she cannot get it out. Pt reports has been there since yesterday.

## 2021-04-04 NOTE — ED Notes (Signed)
See triage note. Pt tearful but able to walk back. Given gown to change into.

## 2021-04-04 NOTE — Discharge Instructions (Addendum)
Follow-up with your primary care provider if any continued problems or concerns.  A tampon was not visualized or felt with bimanual exam.  Most likely it came out while you are on the toilet.  If any continued problems follow-up with your primary care provider and if any severe worsening of your symptoms return to the emergency department.

## 2021-07-23 ENCOUNTER — Other Ambulatory Visit: Payer: Self-pay

## 2021-07-23 ENCOUNTER — Encounter: Payer: Self-pay | Admitting: Obstetrics & Gynecology

## 2021-07-23 ENCOUNTER — Ambulatory Visit (INDEPENDENT_AMBULATORY_CARE_PROVIDER_SITE_OTHER): Payer: Medicaid Other | Admitting: Obstetrics & Gynecology

## 2021-07-23 ENCOUNTER — Other Ambulatory Visit (HOSPITAL_COMMUNITY)
Admission: RE | Admit: 2021-07-23 | Discharge: 2021-07-23 | Disposition: A | Discharge status: Home / Self Care | Payer: Medicaid Other | Source: Ambulatory Visit | Attending: Obstetrics & Gynecology | Admitting: Obstetrics & Gynecology

## 2021-07-23 VITALS — BP 120/80 | Ht 63.0 in | Wt 172.0 lb

## 2021-07-23 DIAGNOSIS — Z0000 Encounter for general adult medical examination without abnormal findings: Secondary | ICD-10-CM

## 2021-07-23 DIAGNOSIS — R1032 Left lower quadrant pain: Secondary | ICD-10-CM | POA: Diagnosis not present

## 2021-07-23 DIAGNOSIS — Z01419 Encounter for gynecological examination (general) (routine) without abnormal findings: Secondary | ICD-10-CM

## 2021-07-23 DIAGNOSIS — Z124 Encounter for screening for malignant neoplasm of cervix: Secondary | ICD-10-CM | POA: Insufficient documentation

## 2021-07-23 DIAGNOSIS — Z13220 Encounter for screening for lipoid disorders: Secondary | ICD-10-CM

## 2021-07-23 DIAGNOSIS — Z131 Encounter for screening for diabetes mellitus: Secondary | ICD-10-CM

## 2021-07-23 DIAGNOSIS — Z1329 Encounter for screening for other suspected endocrine disorder: Secondary | ICD-10-CM

## 2021-07-23 DIAGNOSIS — E538 Deficiency of other specified B group vitamins: Secondary | ICD-10-CM

## 2021-07-23 DIAGNOSIS — Z1321 Encounter for screening for nutritional disorder: Secondary | ICD-10-CM

## 2021-07-23 NOTE — Patient Instructions (Signed)
Thank you for choosing [facility]. As part of our ongoing efforts to improve patient experience, we would appreciate your feedback. Please fill out the short survey that you will receive by mail or MyChart. Your opinion is important to us! -Dr Azazel Franze  Recommendations to boost your immunity to prevent illness such as viral flu and colds, including covid19, are as follows:       - - -  Vitamin K2 and Vitamin D3  - - - Take Vitamin K2 at 200-300 mcg daily (usually 2-3 pills daily of the over the counter formulation). Take Vitamin D3 at 3000-4000 U daily (usually 3-4 pills daily of the over the counter formulation). Studies show that these two at high normal levels in your system are very effective in keeping your immunity so strong and protective that you will be unlikely to contract viral illness such as those listed above.  Dr Shavonna Corella  

## 2021-07-23 NOTE — Progress Notes (Signed)
HPI:      [name] is a 30 y.o. G1P1001 who LMP was Patient's last menstrual period was 06/28/2021., she presents today for her annual examination. The patient has no complaints today, other than irreg period this month despite all prior periods since Lap BTL last year regular and normal.  This period has had prolonged brown discharge following reg flow. Also has some LLQ pain this month.  Stress from death of mother last week.  The patient is sexually active. Her last pap: was normal. The patient does perform self breast exams.  There is no notable family history of breast or ovarian cancer in her family, just cervical cancer.  The patient has regular exercise: yes.  The patient denies current symptoms of depression.    GYN History: Contraception: tubal ligation  PMHx: Past Medical History:  Diagnosis Date   Dysplasia of cervix 2013   dyplasia has resolved since birth of son in 2013.   GERD (gastroesophageal reflux disease)    Psoriasis of scalp 2004   mainly scalp, uses sun for treatment   Past Surgical History:  Procedure Laterality Date   ABDOMINAL SURGERY     APPENDECTOMY     CESAREAN SECTION     CESAREAN SECTION N/A 2013   CYSTOSCOPY N/A 05/23/2015   Procedure: CYSTOSCOPY;  Surgeon: Nadara Mustard, MD;  Location: [facility] ORS;  Service: Gynecology;  Laterality: N/A;   ESOPHAGOGASTRODUODENOSCOPY N/A 07/02/2015   Procedure: ESOPHAGOGASTRODUODENOSCOPY (EGD) looking in the esophagus stomach and upper small intestine with a lighted tube to evaluate and treat;  Surgeon: Christena Deem, MD;  Location: [facility] ENDOSCOPY;  Service: Endoscopy;  Laterality: N/A;   INCISION AND DRAINAGE ABSCESS N/A 10/11/2017   Procedure: INCISION AND DRAINAGE SKENE CYST;  Surgeon: Natale Milch, MD;  Location: [facility] ORS;  Service: Gynecology;  Laterality: N/A;   LAPAROSCOPIC BILATERAL SALPINGECTOMY Bilateral 08/28/2020   Procedure: LAPAROSCOPY with TUBAL PARTIAL SALPINGECTOMY;  Surgeon: Nadara Mustard, MD;  Location: [facility] ORS;  Service: Gynecology;  Laterality: Bilateral;   LAPAROSCOPY N/A 05/23/2015   Procedure: LAPAROSCOPY DIAGNOSTIC;  Surgeon: Nadara Mustard, MD;  Location: [facility] ORS;  Service: Gynecology;  Laterality: N/A;   Family History  Problem Relation Age of Onset   Cancer Mother    Cancer Maternal Grandmother    Heart disease Maternal Grandmother    Cancer Maternal Aunt    Social History   Tobacco Use   Smoking status: Every Day    Packs/day: 0.50    Types: Cigarettes   Smokeless tobacco: Never  Vaping Use   Vaping Use: Never used  Substance Use Topics   Alcohol use: Yes    Alcohol/week: 0.0 standard drinks    Comment: occasionally   Drug use: Yes    Types: Marijuana    Comment: Once every 3 months    Current Outpatient Medications:    acetaminophen (TYLENOL) 500 MG tablet, Take 500 mg by mouth every 6 (six) hours as needed., Disp: , Rfl:    busPIRone (BUSPAR) 15 MG tablet, Take 15 mg by mouth 2 (two) times daily. , Disp: , Rfl:    gabapentin (NEURONTIN) 300 MG capsule, Take 300 mg by mouth 3 (three) times daily. , Disp: , Rfl:    pantoprazole (PROTONIX) 40 MG tablet, Take 40 mg by mouth daily. , Disp: , Rfl:    Vaginal Lubricant (REPLENS) GEL, Place 1 Applicatorful vaginally 2 (two) times a week., Disp: 35 g, Rfl: 11 Allergies: Norethindrone-eth estradiol  and Tramadol  Review of Systems  Constitutional:  Positive for malaise/fatigue. Negative for chills and fever.  HENT:  Negative for congestion, sinus pain and sore throat.   Eyes:  Negative for blurred vision and pain.  Respiratory:  Negative for cough and wheezing.   Cardiovascular:  Negative for chest pain and leg swelling.  Gastrointestinal:  Positive for abdominal pain. Negative for constipation, diarrhea, heartburn, nausea and vomiting.  Genitourinary:  Negative for dysuria, frequency, hematuria and urgency.  Musculoskeletal:  Negative for back pain, joint pain, myalgias and neck pain.  Skin:   Negative for itching and rash.  Neurological:  Negative for dizziness, tremors and weakness.  Endo/Heme/Allergies:  Does not bruise/bleed easily.  Psychiatric/Behavioral:  Negative for depression. The patient is not nervous/anxious and does not have insomnia.    Objective: BP 120/80   Ht 5\' 3"  (1.6 m)   Wt 172 lb (78 kg)   LMP 06/28/2021   BMI 30.47 kg/m   Filed Weights   07/23/21 0854  Weight: 172 lb (78 kg)   Body mass index is 30.47 kg/m. Physical Exam Constitutional:      General: She is not in acute distress.    Appearance: She is well-developed.  Genitourinary:     Bladder, rectum and urethral meatus normal.     No lesions in the vagina.     Right Labia: No rash, tenderness or lesions.    Left Labia: No tenderness, lesions or rash.    No vaginal bleeding.      Right Adnexa: not tender and no mass present.    Left Adnexa: tender.    Left Adnexa: no mass present.    No cervical motion tenderness, friability, lesion or polyp.     Uterus is not enlarged.     No uterine mass detected.    Pelvic exam was performed with patient in the lithotomy position.  Breasts:    Right: No mass, skin change or tenderness.     Left: No mass, skin change or tenderness.  HENT:     Head: Normocephalic and atraumatic. No laceration.     Right Ear: Hearing normal.     Left Ear: Hearing normal.     Mouth/Throat:     Pharynx: Uvula midline.  Eyes:     Pupils: Pupils are equal, round, and reactive to light.  Neck:     Thyroid: No thyromegaly.  Cardiovascular:     Rate and Rhythm: Normal rate and regular rhythm.     Heart sounds: No murmur heard.   No friction rub. No gallop.  Pulmonary:     Effort: Pulmonary effort is normal. No respiratory distress.     Breath sounds: Normal breath sounds. No wheezing.  Abdominal:     General: Bowel sounds are normal. There is no distension.     Palpations: Abdomen is soft.     Tenderness: There is no abdominal tenderness. There is no rebound.   Musculoskeletal:        General: Normal range of motion.     Cervical back: Normal range of motion and neck supple.  Neurological:     Mental Status: She is alert and oriented to person, place, and time.     Cranial Nerves: No cranial nerve deficit.  Skin:    General: Skin is warm and dry.  Psychiatric:        Judgment: Judgment normal.  Vitals reviewed.    Assessment:  ANNUAL EXAM 1. Women's annual routine gynecological examination  2. Screening for malignant neoplasm of cervix   3. LLQ pain   4. Screening cholesterol level   5. Screening for diabetes mellitus   6. Screening for thyroid disorder   7. Encounter for vitamin deficiency screening   8. Vitamin B12 deficiency      Screening Plan:            1.  Cervical Screening-  Pap smear done today  2. Breast screening- Exam annually and mammogram>40 planned   3. Colonoscopy every 10 years, Hemoccult testing - after age 28  4. Labs Ordered today  5. Counseling for contraception: bilateral tubal ligation  6. LLQ pain - [place] to assess - US PELVIS TRANSVAGINAL NON-OB (TV ONLY); Future  7. Vitamin B12 deficiency Cont supplementation - Vitamin B12    F/U  Return in about 1 year (around 07/23/2022) for Annual.  Annamarie Major, MD, [facility], [facility] 07/23/2021  9:25 AM

## 2021-07-24 LAB — GLUCOSE, FASTING: Glucose, Plasma: 83 mg/dL (ref 70–99)

## 2021-07-24 LAB — TSH: TSH: 2.07 u[IU]/mL (ref 0.450–4.500)

## 2021-07-24 LAB — VITAMIN B12: Vitamin B-12: 338 pg/mL (ref 232–1245)

## 2021-07-24 LAB — LIPID PANEL
Chol/HDL Ratio: 3.2 ratio (ref 0.0–4.4)
Cholesterol, Total: 208 mg/dL — ABNORMAL HIGH (ref 100–199)
HDL: 65 mg/dL (ref >39)
LDL Chol Calc (NIH): 109 mg/dL — ABNORMAL HIGH (ref 0–99)
Triglycerides: 200 mg/dL — ABNORMAL HIGH (ref 0–149)
VLDL Cholesterol Cal: 34 mg/dL (ref 5–40)

## 2021-07-24 LAB — VITAMIN D 25 HYDROXY (VIT D DEFICIENCY, FRACTURES): Vit D, 25-Hydroxy: 39.3 ng/mL (ref 30.0–100.0)

## 2021-07-27 LAB — CYTOLOGY - PAP
Diagnosis: NEGATIVE
Diagnosis: REACTIVE

## 2021-08-03 ENCOUNTER — Other Ambulatory Visit: Payer: Self-pay

## 2021-08-03 ENCOUNTER — Ambulatory Visit (INDEPENDENT_AMBULATORY_CARE_PROVIDER_SITE_OTHER): Payer: Medicaid Other

## 2021-08-03 ENCOUNTER — Other Ambulatory Visit: Payer: Self-pay | Admitting: Obstetrics & Gynecology

## 2021-08-03 DIAGNOSIS — R1032 Left lower quadrant pain: Secondary | ICD-10-CM | POA: Diagnosis not present

## 2021-08-04 ENCOUNTER — Ambulatory Visit (INDEPENDENT_AMBULATORY_CARE_PROVIDER_SITE_OTHER): Payer: Medicaid Other | Admitting: Obstetrics & Gynecology

## 2021-08-04 ENCOUNTER — Encounter: Payer: Self-pay | Admitting: Obstetrics & Gynecology

## 2021-08-04 DIAGNOSIS — N926 Irregular menstruation, unspecified: Secondary | ICD-10-CM

## 2021-08-04 DIAGNOSIS — R1032 Left lower quadrant pain: Secondary | ICD-10-CM

## 2021-08-04 NOTE — Progress Notes (Signed)
Virtual Visit via Telephone Note  I connected with Mandy Hansen on 08/04/21 at 11:20 AM EST by telephone and verified that I am speaking with the correct person using two identifiers.  Location: Patient: home Provider: office   I discussed the limitations, risks, security and privacy concerns of performing an evaluation and management service by telephone and the availability of in person appointments. I also discussed with the patient that there may be a patient responsible charge related to this service. The patient expressed understanding and agreed to proceed.  History of Present Illness: Pt has been having a recent irreg period after being very regular for the past year- this past one was 8 days late (not pregnant, s/p tubal) and has been associated w lower abdominal pain, L>R.  Ultrasound demonstrates no masses seen, no cyst  PMHx: She  has a past medical history of Dysplasia of cervix (2013), GERD (gastroesophageal reflux disease), and Psoriasis of scalp (2004). Also,  has a past surgical history that includes Cesarean section; Cesarean section (N/A, 2013); laparoscopy (N/A, 05/23/2015); Cystoscopy (N/A, 05/23/2015); Esophagogastroduodenoscopy (N/A, 07/02/2015); Abdominal surgery; Appendectomy; Incision and drainage abscess (N/A, 10/11/2017); and Laparoscopic bilateral salpingectomy (Bilateral, 08/28/2020)., family history includes Cancer in her maternal aunt, maternal grandmother, and mother; Heart disease in her maternal grandmother.,  reports that she has been smoking cigarettes. She has been smoking an average of .5 packs per day. She has never used smokeless tobacco. She reports current alcohol use. She reports current drug use. Drug: Marijuana.  She has a current medication list which includes the following prescription(s): acetaminophen, buspirone, gabapentin, pantoprazole, and replens. Also, is allergic to norethindrone-eth estradiol and tramadol.  Review of Systems  All other  systems reviewed and are negative.  US PELVIC COMPLETE WITH TRANSVAGINAL  Result Date: 08/04/2021 Patient Name: Mandy Hansen DOB: 1991-09-03 MRN: [id number] ULTRASOUND REPORT Location: [facility] Date of Service: 08/03/2021 Indications:Pelvic Pain Findings: The uterus is anteverted and measures 7.5 x 2.9 x 3.4 cm. Echo texture is heterogenous without evidence of focal masses. There is a 2 mm calcification seen adjacent to the endometrium. The Endometrium measures 5.1 mm.  There is blood swirling seen, consistent with patient's currently menstruating status. Right Ovary measures 1.6 x 1.4 x 2.0 cm. It is normal in appearance. Left Ovary measures 1.7 x 2.2 x 2.6 cm. It is normal in appearance. Survey of the adnexa demonstrates no adnexal masses. There is no free fluid in the cul de sac. Impression: 1.No abnormality seen. Recommendations: 1.Clinical correlation with the patient's History and Physical Exam. Sheralyn Boatman  [place] Review of ULTRASOUND.    I have personally reviewed images and report of recent ultrasound done at [facility].    Plan of management to be discussed with patient. Annamarie Major, MD, FACOG [facility], [facility] 08/04/2021  7:49 AM      Observations/Objective: No exam today, due to telephone eVisit due to [facility] virus restriction on elective visits and procedures.  Prior visits reviewed along with ultrasounds/labs as indicated.  Assessment and Plan:   ICD-10-CM   1. LLQ pain  R10.32     2. Irregular periods  N92.6     Monitor next few cycles for pain and bleeding Discussed normal [place]  Follow Up Instructions: PRN   I discussed the assessment and treatment plan with the patient. The patient was provided an opportunity to ask questions and all were answered. The patient agreed with the plan and demonstrated an understanding of the instructions.  The patient was advised to call back or seek an in-person evaluation if the symptoms worsen or if the  condition fails to improve as anticipated.  I provided 15 minutes of non-face-to-face time during this encounter.   Letitia Libra, MD

## 2021-10-19 ENCOUNTER — Encounter: Payer: Self-pay | Admitting: Obstetrics & Gynecology

## 2021-10-19 ENCOUNTER — Ambulatory Visit (INDEPENDENT_AMBULATORY_CARE_PROVIDER_SITE_OTHER): Payer: Medicaid Other | Admitting: Obstetrics & Gynecology

## 2021-10-19 ENCOUNTER — Other Ambulatory Visit: Payer: Self-pay

## 2021-10-19 VITALS — BP 120/80 | Ht 63.0 in | Wt 176.0 lb

## 2021-10-19 DIAGNOSIS — R197 Diarrhea, unspecified: Secondary | ICD-10-CM

## 2021-10-19 DIAGNOSIS — R1012 Left upper quadrant pain: Secondary | ICD-10-CM

## 2021-10-19 DIAGNOSIS — K529 Noninfective gastroenteritis and colitis, unspecified: Secondary | ICD-10-CM | POA: Diagnosis not present

## 2021-10-19 MED ORDER — METRONIDAZOLE 500 MG PO TABS: 500.0000 mg | ORAL_TABLET | Freq: Two times a day (BID) | ORAL | 0 refills | Status: DC

## 2021-10-19 NOTE — Progress Notes (Signed)
Gynecology Pelvic Pain Evaluation   Chief Complaint:  Chief Complaint  Patient presents with   left side pain    Blood in stool    Diarrhea    History of Present Illness:   Patient is a 31 y.o. G1P1001 who LMP was Patient's last menstrual period was 08/30/2021., presents today for a problem visit.  She complains of pain.   Her pain is localized to the LUQ area, described as intermittent, sharp, and burning, began several weeks ago and its severity is described as moderate. The pain radiates to the  Non-radiating. She has these associated symptoms which include diarrhea.  She also had a perianal boil like lesion for about 1 week, it has since resolved.  Patient has these modifiers which include  hot compresses and baths/showers  that make it better and unable to associate with any factor that make it worse.  Context includes: new problem.  H/o GERD, and it has worsened as well during this time (3 weeks of these sx's.)  PMHx: She  has a past medical history of Dysplasia of cervix (2013), GERD (gastroesophageal reflux disease), and Psoriasis of scalp (2004). Also,  has a past surgical history that includes Cesarean section; Cesarean section (N/A, 2013); laparoscopy (N/A, 05/23/2015); Cystoscopy (N/A, 05/23/2015); Esophagogastroduodenoscopy (N/A, 07/02/2015); Abdominal surgery; Appendectomy; Incision and drainage abscess (N/A, 10/11/2017); and Laparoscopic bilateral salpingectomy (Bilateral, 08/28/2020)., family history includes Cancer in her maternal aunt, maternal grandmother, and mother; Heart disease in her maternal grandmother; Lung cancer in her mother.,  reports that she has been smoking cigarettes. She has been smoking an average of .5 packs per day. She has never used smokeless tobacco. She reports current alcohol use. She reports current drug use. Drug: Marijuana.  She has a current medication list which includes the following prescription(s): acetaminophen, buspirone, gabapentin,  metronidazole, pantoprazole, and replens. Also, is allergic to norethindrone-eth estradiol and tramadol.  Review of Systems  Constitutional:  Positive for malaise/fatigue. Negative for chills and fever.  HENT:  Negative for congestion, sinus pain and sore throat.   Eyes:  Negative for blurred vision and pain.  Respiratory:  Negative for cough and wheezing.   Cardiovascular:  Negative for chest pain and leg swelling.  Gastrointestinal:  Positive for abdominal pain and diarrhea. Negative for constipation, heartburn, nausea and vomiting.  Genitourinary:  Negative for dysuria, frequency, hematuria and urgency.  Musculoskeletal:  Negative for back pain, joint pain, myalgias and neck pain.  Skin:  Negative for itching and rash.  Neurological:  Negative for dizziness, tremors and weakness.  Endo/Heme/Allergies:  Does not bruise/bleed easily.  Psychiatric/Behavioral:  Negative for depression. The patient is not nervous/anxious and does not have insomnia.    Objective: BP 120/80    Ht 5\' 3"  (1.6 m)    Wt 176 lb (79.8 kg)    LMP 08/30/2021    BMI 31.18 kg/m  Physical Exam Constitutional:      General: She is not in acute distress.    Appearance: She is well-developed.  HENT:     Head: Normocephalic and atraumatic. No laceration.     Right Ear: Hearing normal.     Left Ear: Hearing normal.     Mouth/Throat:     Pharynx: Uvula midline.  Eyes:     Pupils: Pupils are equal, round, and reactive to light.  Neck:     Thyroid: No thyromegaly.  Cardiovascular:     Rate and Rhythm: Normal rate and regular rhythm.     Heart sounds: No  murmur heard.   No friction rub. No gallop.  Pulmonary:     Effort: Pulmonary effort is normal. No respiratory distress.     Breath sounds: Normal breath sounds. No wheezing.  Abdominal:     General: Bowel sounds are normal. There is no distension.     Palpations: Abdomen is soft.     Tenderness: There is abdominal tenderness in the left upper quadrant. There is  no guarding or rebound. Negative signs include Murphy's sign and McBurney's sign.  Musculoskeletal:        General: Normal range of motion.     Cervical back: Normal range of motion and neck supple.  Neurological:     Mental Status: She is alert and oriented to person, place, and time.     Cranial Nerves: No cranial nerve deficit.  Skin:    General: Skin is warm and dry.  Psychiatric:        Judgment: Judgment normal.  Vitals reviewed.    Assessment: 31 y.o. G1P1001  Problem List Items Addressed This Visit     LUQ pain    -  Primary   Diarrhea, unspecified type       Colitis       Refer to PCP, and even GI Flagyl ABX given No s/sx Gyn concern    Recent US was normal for lower quadrant pathology    No current sx's in GYN areas    Barnett Applebaum, MD, [facility], [facility] 10/19/2021  2:51 PM

## 2021-10-20 ENCOUNTER — Encounter: Payer: Self-pay | Admitting: Obstetrics & Gynecology

## 2021-10-21 ENCOUNTER — Other Ambulatory Visit: Payer: Self-pay | Admitting: Obstetrics & Gynecology

## 2021-10-21 DIAGNOSIS — R1012 Left upper quadrant pain: Secondary | ICD-10-CM

## 2021-10-21 DIAGNOSIS — R197 Diarrhea, unspecified: Secondary | ICD-10-CM

## 2021-12-03 ENCOUNTER — Encounter: Payer: Self-pay | Admitting: Gastroenterology

## 2021-12-03 ENCOUNTER — Other Ambulatory Visit: Payer: Self-pay

## 2021-12-03 ENCOUNTER — Ambulatory Visit (INDEPENDENT_AMBULATORY_CARE_PROVIDER_SITE_OTHER): Payer: Medicaid Other | Admitting: Gastroenterology

## 2021-12-03 VITALS — BP 119/77 | HR 74 | Temp 98.7°F | Ht 63.0 in | Wt 175.0 lb

## 2021-12-03 DIAGNOSIS — K625 Hemorrhage of anus and rectum: Secondary | ICD-10-CM

## 2021-12-03 DIAGNOSIS — R194 Change in bowel habit: Secondary | ICD-10-CM

## 2021-12-03 MED ORDER — NA SULFATE-K SULFATE-MG SULF 17.5-3.13-1.6 GM/177ML PO SOLN: 1.0000 | Freq: Once | ORAL | 0 refills | Status: AC

## 2021-12-03 NOTE — Progress Notes (Signed)
Gastroenterology Consultation  Referring Provider:     Oswaldo Conroy, MD Primary Care Physician:  Oswaldo Conroy, MD Primary Gastroenterologist:  Dr. Servando Snare     Reason for Consultation:     Left upper quadrant pain and diarrhea        HPI:   Mandy Hansen is a 31 y.o. y/o female referred for consultation & management of Left upper quadrant pain and diarrhea by Dr. Hessie Diener, Earl Lagos, MD.  This patient comes to see me today after being seen in the past by Dr. Shelle Iron at [facility] in 2016, Dr. Marva Panda and then again by Dr. Arvid Right at [facility] in 2018. The patient had reported, in 2016, at that time of pelvic pain for 3 years that had gotten worse over the previous few weeks. She had reported the pain to be in the lower abdomen and sharp-like pains which was colicky in nature and improved by laying in the fetal position.  At that time she was reporting constipation which was causing her problems with hemorrhoids.  Prior to that the patient had been in the ER back in 2014 and 2015 with severe episodes of pain.  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  After a workup was done by the [facility] GI team it was recommended that the patient follow up with GYN since the problems were deemed not to be from a GI cause.  In 2018 she had been seen at [facility] for rectal/pelvic pain with constipation and had been seen by urogynecology for that pelvic pain back in October 2017.  She was reporting that she had to strain with bowel movements and would have a throbbing sharp ache.  She had reported to Dr. Dorita Fray that she had been having constipation since the age of 33 and the pain had gotten worse after the birth of her son via C-section 5 years prior.  She had been trying stool softeners and at that time reported that she had not had a bowel movement that was formed in over a month.  She was then reporting that she was only having watery stools with significant abdominal pain and  bloating and discomfort.  She had reported that she had gained significant amounts of weight over the month prior to being seen at [facility].  There is also a report of nausea and feeling full quickly after eating. She has undergone exploratory laparotomies with adhesion removal. The patient was recommended to take MiraLAX and glycerin suppositories.  In 2016 the patient underwent a upper endoscopy by Dr. Marva Panda and at that time the patient had some mild chronic duodenitis and biopsies of the esophagus that showed signs of reflux.  In January of this year the patient was seen by GYN. At that time the patient was referred to me due to the report of localized left upper quadrant pain that was intermittent and sharp with a burning sensation that had been going on for a few weeks.  She was also reporting that it came with diarrhea and per Dr. Johnathan Hausen note the patient was having rectal bleeding.  The patient also endorsed worsening of her GERD at that time.  She reports that she has soft stools since Christmas and no constipation with pain in her LUQ.  Past Medical History:  Diagnosis Date   Dysplasia of cervix 2013   dyplasia has resolved since birth of son in 2013.   GERD (gastroesophageal reflux disease)    Psoriasis  of scalp 2004   mainly scalp, uses sun for treatment    Past Surgical History:  Procedure Laterality Date   ABDOMINAL SURGERY     APPENDECTOMY     CESAREAN SECTION     CESAREAN SECTION N/A 2013   CYSTOSCOPY N/A 05/23/2015   Procedure: CYSTOSCOPY;  Surgeon: Gae Dry, MD;  Location: [facility] ORS;  Service: Gynecology;  Laterality: N/A;   ESOPHAGOGASTRODUODENOSCOPY N/A 07/02/2015   Procedure: ESOPHAGOGASTRODUODENOSCOPY (EGD) looking in the esophagus stomach and upper small intestine with a lighted tube to evaluate and treat;  Surgeon: Lollie Sails, MD;  Location: [facility] ENDOSCOPY;  Service: Endoscopy;  Laterality: N/A;   INCISION AND DRAINAGE ABSCESS N/A 10/11/2017   Procedure:  INCISION AND DRAINAGE SKENE CYST;  Surgeon: Homero Fellers, MD;  Location: [facility] ORS;  Service: Gynecology;  Laterality: N/A;   LAPAROSCOPIC BILATERAL SALPINGECTOMY Bilateral 08/28/2020   Procedure: LAPAROSCOPY with TUBAL PARTIAL SALPINGECTOMY;  Surgeon: Gae Dry, MD;  Location: [facility] ORS;  Service: Gynecology;  Laterality: Bilateral;   LAPAROSCOPY N/A 05/23/2015   Procedure: LAPAROSCOPY DIAGNOSTIC;  Surgeon: Gae Dry, MD;  Location: [facility] ORS;  Service: Gynecology;  Laterality: N/A;    Prior to Admission medications   Medication Sig Start Date End Date Taking? Authorizing Provider  acetaminophen (TYLENOL) 500 MG tablet Take 500 mg by mouth every 6 (six) hours as needed.    [provider]  busPIRone (BUSPAR) 15 MG tablet Take 15 mg by mouth 2 (two) times daily.  11/13/19   [provider]  gabapentin (NEURONTIN) 300 MG capsule Take 300 mg by mouth 3 (three) times daily.     [provider]  metroNIDAZOLE (FLAGYL) 500 MG tablet Take 1 tablet (500 mg total) by mouth 2 (two) times daily. 10/19/21   Gae Dry, MD  pantoprazole (PROTONIX) 40 MG tablet Take 40 mg by mouth daily.  07/01/15   [provider]  Vaginal Lubricant (REPLENS) GEL Place 1 Applicatorful vaginally 2 (two) times a week. Patient not taking: Reported on 10/19/2021 02/23/21   Gae Dry, MD    Family History  Problem Relation Age of Onset   Cancer Mother    Lung cancer Mother    Cancer Maternal Grandmother    Heart disease Maternal Grandmother    Cancer Maternal Aunt      Social History   Tobacco Use   Smoking status: Every Day    Packs/day: 0.50    Types: Cigarettes   Smokeless tobacco: Never  Vaping Use   Vaping Use: Never used  Substance Use Topics   Alcohol use: Yes    Alcohol/week: 0.0 standard drinks    Comment: occasionally   Drug use: Yes    Types: Marijuana    Comment: Once every 3 months    Allergies as of 12/03/2021 - Review Complete  10/19/2021  Allergen Reaction Noted   Norethindrone-eth estradiol Nausea And Vomiting 12/15/2010   Tramadol Other (See Comments) 02/28/2018    Review of Systems:    All systems reviewed and negative except where noted in HPI.   Physical Exam:  There were no vitals taken for this visit. No LMP recorded. General:   Alert,  Well-developed, well-nourished, pleasant and cooperative in NAD Head:  Normocephalic and atraumatic. Eyes:  Sclera clear, no icterus.   Conjunctiva pink. Ears:  Normal auditory acuity. Neck:  Supple; no masses or thyromegaly. Lungs:  Respirations even and unlabored.  Clear throughout to auscultation.   No wheezes, crackles, or  rhonchi. No acute distress. Heart:  Regular rate and rhythm; no murmurs, clicks, rubs, or gallops. Abdomen:  Normal bowel sounds.  No bruits.  Soft, positive tenderness to 1 finger palpation while flexing the abdominal wall muscles and non-distended without masses, hepatosplenomegaly or hernias noted.  No guarding or rebound tenderness.  Negative Carnett sign.   Rectal:  Deferred.  Pulses:  Normal pulses noted. Extremities:  No clubbing or edema.  No cyanosis. Neurologic:  Alert and oriented x3;  grossly normal neurologically. Skin:  Intact without significant lesions or rashes.  No jaundice. Lymph Nodes:  No significant cervical adenopathy. Psych:  Alert and cooperative. Normal mood and affect.  Imaging Studies: No results found.  Assessment and Plan:   Mandy Hansen is a 31 y.o. y/o female who comes in today with a long history of abdominal pain diarrhea constipation and heartburn.  The patient states her heartburn has resolved.  The patient does not have any further constipation and now is having very mushy soft stools since Christmas.  The patient also has had intermittent rectal bleeding.  The patient has abdominal pain that may represent some musculoskeletal pain although she is having lower GI symptoms and rectal bleeding.  The patient  will be set up for colonoscopy to look for source of her change in bowel habits and rectal bleeding.  The patient has been explained the plan and agrees with it.  She will follow-up at the time of the colonoscopy.   Lucilla Lame, MD. Marval Regal    Note: This dictation was prepared with Dragon dictation along with smaller phrase technology. Any transcriptional errors that result from this process are unintentional.

## 2021-12-28 ENCOUNTER — Other Ambulatory Visit: Payer: Self-pay | Admitting: Gastroenterology

## 2021-12-28 DIAGNOSIS — R194 Change in bowel habit: Secondary | ICD-10-CM

## 2021-12-28 DIAGNOSIS — K625 Hemorrhage of anus and rectum: Secondary | ICD-10-CM

## 2022-01-05 ENCOUNTER — Encounter: Payer: Self-pay | Admitting: Gastroenterology

## 2022-01-06 ENCOUNTER — Encounter: Payer: Self-pay | Admitting: Anesthesiology

## 2022-01-25 ENCOUNTER — Ambulatory Visit: Admission: RE | Admit: 2022-01-25 | Payer: Medicaid Other | Source: Home / Self Care | Admitting: Gastroenterology

## 2022-01-25 ENCOUNTER — Other Ambulatory Visit: Payer: Self-pay

## 2022-01-25 DIAGNOSIS — R1310 Dysphagia, unspecified: Secondary | ICD-10-CM

## 2022-01-25 HISTORY — DX: Presence of dental prosthetic device (complete) (partial): Z97.2

## 2022-01-25 SURGERY — COLONOSCOPY
Anesthesia: Choice

## 2022-01-25 NOTE — Anesthesia Preprocedure Evaluation (Deleted)
Anesthesia Evaluation  ? ? ?Airway ? ? ? ? ? ? ? Dental ? ?(+) Upper Dentures ?  ?Pulmonary ?Current Smoker,  ?  ? ? ? ? ? ? ? Cardiovascular ? ? ? ?  ?Neuro/Psych ?  ? GI/Hepatic ?GERD  ,(+)  ?  ? substance abuse ? marijuana use,   ?Endo/Other  ? ? Renal/GU ?  ? ?  ?Musculoskeletal ? ? Abdominal ?  ?Peds ? Hematology ?  ?Anesthesia Other Findings ? ? Reproductive/Obstetrics ? ?  ? ? ? ? ? ? ? ? ? ? ? ? ? ?  ?  ? ? ? ? ? ? ? ? ?Anesthesia Physical ?Anesthesia Plan ? ?ASA: 2 ? ?Anesthesia Plan: General  ? ?Post-op Pain Management:   ? ?Induction: Intravenous ? ?PONV Risk Score and Plan: 2 and Propofol infusion, TIVA and Treatment may vary due to age or medical condition ? ?Airway Management Planned: Natural Airway and Nasal Cannula ? ?Additional Equipment:  ? ?Intra-op Plan:  ? ?Post-operative Plan:  ? ?Informed Consent: I have reviewed the patients History and Physical, chart, labs and discussed the procedure including the risks, benefits and alternatives for the proposed anesthesia with the patient or authorized representative who has indicated his/her understanding and acceptance.  ? ? ? ? ? ?Plan Discussed with: CRNA and Anesthesiologist ? ?Anesthesia Plan Comments:   ? ? ? ? ? ? ?Anesthesia Quick Evaluation ? ?

## 2022-02-01 ENCOUNTER — Ambulatory Visit: Admit: 2022-02-01 | Payer: Medicaid Other | Admitting: Gastroenterology

## 2022-02-01 SURGERY — ESOPHAGOGASTRODUODENOSCOPY (EGD) WITH PROPOFOL
Anesthesia: Choice

## 2023-01-03 NOTE — Progress Notes (Unsigned)
Mandy Conroy, MD   No chief complaint on file.   HPI:      [name] is a 32 y.o. 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   Epigastric pain 06/30/2015    Past Surgical History:  Procedure Laterality Date   ABDOMINAL SURGERY     APPENDECTOMY     CESAREAN SECTION     CESAREAN SECTION N/A 2013   CYSTOSCOPY N/A 05/23/2015   Procedure: CYSTOSCOPY;  Surgeon: Nadara Mustard, MD;  Location: [facility] ORS;  Service: Gynecology;  Laterality: N/A;   ESOPHAGOGASTRODUODENOSCOPY N/A 07/02/2015   Procedure: ESOPHAGOGASTRODUODENOSCOPY (EGD) looking in the esophagus stomach and upper small intestine with a lighted tube to evaluate and treat;  Surgeon: Christena Deem, MD;  Location: [facility] ENDOSCOPY;  Service: Endoscopy;  Laterality: N/A;   INCISION AND DRAINAGE ABSCESS N/A 10/11/2017   Procedure: INCISION AND DRAINAGE SKENE CYST;  Surgeon: Natale Milch, MD;  Location: [facility] ORS;  Service: Gynecology;  Laterality: N/A;   LAPAROSCOPIC BILATERAL SALPINGECTOMY Bilateral 08/28/2020   Procedure: LAPAROSCOPY with TUBAL PARTIAL SALPINGECTOMY;  Surgeon: Nadara Mustard, MD;  Location: [facility] ORS;  Service: Gynecology;  Laterality: Bilateral;   LAPAROSCOPY N/A 05/23/2015   Procedure: LAPAROSCOPY DIAGNOSTIC;  Surgeon: Nadara Mustard, MD;  Location: [facility] ORS;  Service: Gynecology;  Laterality: N/A;    Family History  Problem Relation Age of Onset   Cancer Mother    Lung cancer Mother    Cancer Maternal Grandmother    Heart disease Maternal Grandmother    Cancer Maternal Aunt     Social History   Socioeconomic History   Marital status: Single    Spouse name: Not on file   Number of children: Not on file   Years of education: Not on file   Highest education level: Not on file  Occupational History    Not on file  Tobacco Use   Smoking status: Every Day    Packs/day: 0.50    Years: 13.00    Additional pack years: 0.00    Total pack years: 6.50    Types: Cigarettes   Smokeless tobacco: Never   Tobacco comments:    Started smoking around age 29  Vaping Use   Vaping Use: Never used  Substance and Sexual Activity   Alcohol use: Yes    Alcohol/week: 0.0 standard drinks of alcohol    Comment: occasionally   Drug use: Yes    Types: Marijuana    Comment: Once every 3 months   Sexual activity: Yes    Birth control/protection: Surgical  Other Topics Concern   Not on file  Social History Narrative   Not on file   Social Determinants of Health   Financial Resource Strain: Not on file  Food Insecurity: Not on file  Transportation Needs: Not on file  Physical Activity: Not on file  Stress: Not on file  Social Connections: Not on file  Intimate Partner Violence: Not on file    Outpatient Medications Prior to Visit  Medication Sig Dispense Refill   acetaminophen (TYLENOL) 500 MG tablet Take 500 mg by mouth every 6 (six) hours as needed.     busPIRone (BUSPAR) 15 MG tablet Take 15 mg by mouth 2 (two) times daily.  gabapentin (NEURONTIN) 300 MG capsule Take 300 mg by mouth 3 (three) times daily.      pantoprazole (PROTONIX) 40 MG tablet Take 40 mg by mouth daily.      Vaginal Lubricant (REPLENS) GEL Place 1 Applicatorful vaginally 2 (two) times a week. 35 g 11   vitamin B-12 (CYANOCOBALAMIN) 500 MCG tablet Take 500 mcg by mouth daily.     No facility-administered medications prior to visit.      ROS:  Review of Systems BREAST: No symptoms   OBJECTIVE:   Vitals:  There were no vitals taken for this visit.  Physical Exam  Results: No results found for this or any previous visit (from the past 24 hour(s)).   Assessment/Plan: No diagnosis found.    No orders of the defined types were placed in this encounter.     No follow-ups on file.  Erinn Mendosa B.  Maygan Koeller, PA-C 01/03/2023 8:16 PM

## 2023-01-04 ENCOUNTER — Ambulatory Visit (INDEPENDENT_AMBULATORY_CARE_PROVIDER_SITE_OTHER): Payer: Medicaid Other | Admitting: Obstetrics and Gynecology

## 2023-01-04 ENCOUNTER — Encounter: Payer: Self-pay | Admitting: Obstetrics and Gynecology

## 2023-01-04 VITALS — BP 130/64 | Ht 63.0 in | Wt 158.0 lb

## 2023-01-04 DIAGNOSIS — N946 Dysmenorrhea, unspecified: Secondary | ICD-10-CM | POA: Diagnosis not present

## 2023-01-04 DIAGNOSIS — R197 Diarrhea, unspecified: Secondary | ICD-10-CM | POA: Diagnosis not present

## 2023-01-04 DIAGNOSIS — R350 Frequency of micturition: Secondary | ICD-10-CM

## 2023-01-04 DIAGNOSIS — N926 Irregular menstruation, unspecified: Secondary | ICD-10-CM | POA: Diagnosis not present

## 2023-01-04 DIAGNOSIS — Z3202 Encounter for pregnancy test, result negative: Secondary | ICD-10-CM

## 2023-01-04 LAB — POCT URINE PREGNANCY: Preg Test, Ur: NEGATIVE

## 2023-01-04 LAB — POCT URINALYSIS DIPSTICK
Bilirubin, UA: NEGATIVE
Blood, UA: NEGATIVE
Glucose, UA: NEGATIVE
Ketones, UA: NEGATIVE
Leukocytes, UA: NEGATIVE
Nitrite, UA: NEGATIVE
Protein, UA: NEGATIVE
Spec Grav, UA: 1.02 (ref 1.010–1.025)
pH, UA: 6 (ref 5.0–8.0)

## 2023-01-04 NOTE — Patient Instructions (Signed)
I value your feedback and you entrusting us with your care. If you get a [facility] patient survey, I would appreciate you taking the time to let us know about your experience today. Thank you! ? ? ?

## 2023-02-04 ENCOUNTER — Ambulatory Visit
Admission: EM | Admit: 2023-02-04 | Discharge: 2023-02-04 | Disposition: A | Discharge status: Home / Self Care | Payer: Medicaid Other | Attending: Physician Assistant | Admitting: Physician Assistant

## 2023-02-04 ENCOUNTER — Ambulatory Visit (INDEPENDENT_AMBULATORY_CARE_PROVIDER_SITE_OTHER): Payer: Medicaid Other

## 2023-02-04 DIAGNOSIS — M25571 Pain in right ankle and joints of right foot: Secondary | ICD-10-CM | POA: Diagnosis not present

## 2023-02-04 DIAGNOSIS — S9781XA Crushing injury of right foot, initial encounter: Secondary | ICD-10-CM

## 2023-02-04 NOTE — ED Triage Notes (Signed)
Pt c/o right foot pain x3days  Pt states that 2 nights ago, she dropped a crockpot on her foot and has pain radiating from her big toe to her shin.   Pt states that she can not flex her foot or her toes and the pain sits right above her ankle when moving her foot.

## 2023-02-04 NOTE — Discharge Instructions (Signed)
-  No fractures.   Avoid painful activities . Apply ice frequently and elevate extremity to help with swelling. Use medications as directed, including NSAIDs. If no NSAIDs have been prescribed for you today, you may take Aleve or Motrin over the counter. May use Tylenol in between doses of NSAIDs.  If no improvement in the next 1-2 weeks, f/u with PCP or return to our office for reexamination, and please feel free to call or return at any time for any questions or concerns you may have and we will be happy to help you!

## 2023-02-04 NOTE — ED Provider Notes (Signed)
[facility]    CSN: [id number] Arrival date & time: 02/04/23  4098      History   Chief Complaint Chief Complaint  Patient presents with   Foot Injury         HPI Mandy Hansen is a 32 y.o. female presenting for right foot pain, swelling and bruising x 3 days. Patient reports that she accidentally dropped a crockpot on her foot and now she has pain in the foot that is radiating to ankle. Reports increased pain with moving foot and says most pain is of the hindfoot and ankle. Still able to bear weight. No  numbness, weakness, tingling. Taking Tylenol and has also applied ice. Patient would like to assess for fracture. Denies other injuries or complaints.   HPI  Past Medical History:  Diagnosis Date   Dysplasia of cervix 2013   dyplasia has resolved since birth of son in 2013.   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   Epigastric pain 06/30/2015    Past Surgical History:  Procedure Laterality Date   ABDOMINAL SURGERY     APPENDECTOMY     CESAREAN SECTION     CESAREAN SECTION N/A 2013   CYSTOSCOPY N/A 05/23/2015   Procedure: CYSTOSCOPY;  Surgeon: Nadara Mustard, MD;  Location: [facility];  Service: Gynecology;  Laterality: N/A;   ESOPHAGOGASTRODUODENOSCOPY N/A 07/02/2015   Procedure: ESOPHAGOGASTRODUODENOSCOPY (EGD) looking in the esophagus stomach and upper small intestine with a lighted tube to evaluate and treat;  Surgeon: Christena Deem, MD;  Location: [facility];  Service: Endoscopy;  Laterality: N/A;   INCISION AND DRAINAGE ABSCESS N/A 10/11/2017   Procedure: INCISION AND DRAINAGE SKENE CYST;  Surgeon: Natale Milch, MD;  Location: [facility];  Service: Gynecology;  Laterality: N/A;   LAPAROSCOPIC BILATERAL  SALPINGECTOMY Bilateral 08/28/2020   Procedure: LAPAROSCOPY with TUBAL PARTIAL SALPINGECTOMY;  Surgeon: Nadara Mustard, MD;  Location: [facility];  Service: Gynecology;  Laterality: Bilateral;   LAPAROSCOPY N/A 05/23/2015   Procedure: LAPAROSCOPY DIAGNOSTIC;  Surgeon: Nadara Mustard, MD;  Location: [facility];  Service: Gynecology;  Laterality: N/A;    OB History     Gravida  1   Para  1   Term  1   Preterm      AB      Living  1      SAB      IAB      Ectopic      Multiple      Live Births  1            Home Medications    Prior to Admission medications   Medication Sig Start Date End Date Taking? Authorizing Provider  acetaminophen (TYLENOL) 500 MG tablet Take 500 mg by mouth every 6 (six) hours as needed.   Yes [provider]  busPIRone (BUSPAR) 15 MG tablet Take 15 mg by mouth 2 (two) times daily.  11/13/19  Yes [provider]  gabapentin (NEURONTIN) 300 MG capsule Take 1 capsule by mouth 2 (two) times daily. 11/12/22 11/12/23 Yes [provider]  gabapentin (NEURONTIN) 600 MG tablet Take by mouth. 11/12/22 11/12/23 Yes [provider]  pantoprazole (PROTONIX) 40  MG tablet Take 40 mg by mouth daily.  07/01/15  Yes [provider]    Family History Family History  Problem Relation Age of Onset   Lung cancer Mother    Cervical cancer Mother        early years   Cancer Maternal Aunt        unknow type   Cervical cancer Maternal Grandmother        early years   Heart disease Maternal Grandmother     Social History Social History   Tobacco Use   Smoking status: Every Day    Packs/day: 0.50    Years: 13.00    Additional pack years: 0.00    Total pack years: 6.50    Types: Cigarettes   Smokeless tobacco: Never   Tobacco comments:    Started smoking around age 86  Vaping Use   Vaping Use: Never used  Substance Use Topics   Alcohol use: Yes    Alcohol/week: 0.0 standard drinks of alcohol    Comment:  occasionally   Drug use: Yes    Types: Marijuana    Comment: Once every 3 months     Allergies   Norethindrone-eth estradiol and Tramadol   Review of Systems Review of Systems  Musculoskeletal:  Positive for arthralgias and joint swelling. Negative for gait problem.  Skin:  Positive for color change. Negative for wound.  Neurological:  Negative for weakness and numbness.     Physical Exam Triage Vital Signs ED Triage Vitals  Enc Vitals Group     BP 02/04/23 0834 (!) 141/74     Pulse Rate 02/04/23 0834 68     Resp --      Temp 02/04/23 0834 98.3 F (36.8 C)     Temp Source 02/04/23 0834 Oral     SpO2 02/04/23 0834 99 %     Weight 02/04/23 0832 165 lb (74.8 kg)     Height 02/04/23 0832 5\' 3"  (1.6 m)     Head Circumference --      Peak Flow --      Pain Score 02/04/23 0831 9     Pain Loc --      Pain Edu? --      Excl. in GC? --    No data found.  Updated Vital Signs BP (!) 141/74 (BP Location: Left Arm)   Pulse 68   Temp 98.3 F (36.8 C) (Oral)   Ht 5\' 3"  (1.6 m)   Wt 165 lb (74.8 kg)   LMP 01/28/2023   SpO2 99%   BMI 29.23 kg/m    Physical Exam Vitals and nursing note reviewed.  Constitutional:      General: She is not in acute distress.    Appearance: Normal appearance. She is not ill-appearing or toxic-appearing.  HENT:     Head: Normocephalic and atraumatic.  Eyes:     General: No scleral icterus.       Right eye: No discharge.        Left eye: No discharge.     Conjunctiva/sclera: Conjunctivae normal.  Cardiovascular:     Rate and Rhythm: Normal rate.     Pulses: Normal pulses.  Pulmonary:     Effort: Pulmonary effort is normal. No respiratory distress.  Musculoskeletal:     Cervical back: Neck supple.     Right ankle: Swelling (mild swelling dorsal foot and lateral ankle) and ecchymosis (dorsal great toe, midfoot) present. Tenderness present over the lateral malleolus and base  of 5th metatarsal. Decreased range of motion. Normal pulse.      Right Achilles Tendon: Normal.     Right foot: Swelling and tenderness (TTP 4th and 5th metatarsals and throughout great toe) present. Normal pulse.  Skin:    General: Skin is dry.  Neurological:     General: No focal deficit present.     Mental Status: She is alert. Mental status is at baseline.     Motor: No weakness.     Gait: Gait abnormal.  Psychiatric:        Mood and Affect: Mood normal.        Behavior: Behavior normal.        Thought Content: Thought content normal.      UC Treatments / Results  Labs (all labs ordered are listed, but only abnormal results are displayed) Labs Reviewed - No data to display  EKG   Radiology DG Foot Complete Right  Result Date: 02/04/2023 CLINICAL DATA:  Right foot pain for 3 days after dropping a crockpot onto foot. Pain radiates from big toe to shin. EXAM: RIGHT FOOT COMPLETE - 3+ VIEW; RIGHT ANKLE - COMPLETE 3+ VIEW COMPARISON:  None Available. FINDINGS: Ankle: There is no acute fracture or dislocation. Bony alignment is normal. The joint spaces are preserved. The ankle mortise is intact. The soft tissues are unremarkable. Foot: There is no acute fracture or dislocation. Bony alignment is normal. The joint spaces are preserved. The soft tissues are unremarkable. IMPRESSION: No evidence of acute injury in the ankle or foot. Electronically Signed   By: Lesia Hausen M.D.   On: 02/04/2023 09:09   DG Ankle Complete Right  Result Date: 02/04/2023 CLINICAL DATA:  Right foot pain for 3 days after dropping a crockpot onto foot. Pain radiates from big toe to shin. EXAM: RIGHT FOOT COMPLETE - 3+ VIEW; RIGHT ANKLE - COMPLETE 3+ VIEW COMPARISON:  None Available. FINDINGS: Ankle: There is no acute fracture or dislocation. Bony alignment is normal. The joint spaces are preserved. The ankle mortise is intact. The soft tissues are unremarkable. Foot: There is no acute fracture or dislocation. Bony alignment is normal. The joint spaces are preserved. The soft  tissues are unremarkable. IMPRESSION: No evidence of acute injury in the ankle or foot. Electronically Signed   By: Lesia Hausen M.D.   On: 02/04/2023 09:09    Procedures Procedures (including critical care time)  Medications Ordered in UC Medications - No data to display  Initial Impression / Assessment and Plan / UC Course  I have reviewed the triage vital signs and the nursing notes.  Pertinent labs & imaging results that were available during my care of the patient were reviewed by me and considered in my medical decision making (see chart for details).   32 y/o female presents for right foot/ankle pain after dropping crockpot on extremity x 3 days.   X-ray of foot and ankle obtained.  Negative for fracture. Discussed results with patient.  Reviewed RICE guidelines.  Patient given supportive ankle brace.  Ibuprofen and Tylenol as needed for pain relief.  Reviewed return precautions.   Final Clinical Impressions(s) / UC Diagnoses   Final diagnoses:  Crushing injury of right foot, initial encounter  Acute right ankle pain     Discharge Instructions      -No fractures.   Avoid painful activities . Apply ice frequently and elevate extremity to help with swelling. Use medications as directed, including NSAIDs. If no NSAIDs have been prescribed for  you today, you may take Aleve or Motrin over the counter. May use Tylenol in between doses of NSAIDs.  If no improvement in the next 1-2 weeks, f/u with PCP or return to our office for reexamination, and please feel free to call or return at any time for any questions or concerns you may have and we will be happy to help you!         ED Prescriptions   None    I have reviewed the PDMP during this encounter.   Shirlee Latch, PA-C 02/04/23 [phone]

## 2023-08-09 NOTE — Progress Notes (Unsigned)
PCP:  Oswaldo Conroy, MD   No chief complaint on file.    HPI:      [name] is a 32 y.o. G1P1001 whose LMP was No LMP recorded., presents today for her annual examination.  Her menses are regular every 28-30 days, lasting 7 days.  Dysmenorrhea {dysmen:716}. She {does:18564} have intermenstrual bleeding.  Sex activity: {sex active: 315163}. S/p lap BS 12/21 Last Pap: 07/23/21  Results were: no abnormalities /neg HPV DNA  Hx of STDs: {STD hx:14358}   There is no FH of breast cancer. There is no FH of ovarian cancer. The patient {does:18564} do self-breast exams.  Tobacco use: {tob:20664} Alcohol use: {Alcohol:11675} No drug use.  Exercise: {exercise:31265}  She {does:18564} get adequate calcium and Vitamin D in her diet.  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   Epigastric pain 06/30/2015    Past Surgical History:  Procedure Laterality Date   ABDOMINAL SURGERY     APPENDECTOMY     CESAREAN SECTION     CESAREAN SECTION N/A 2013   CYSTOSCOPY N/A 05/23/2015   Procedure: CYSTOSCOPY;  Surgeon: Nadara Mustard, MD;  Location: [facility];  Service: Gynecology;  Laterality: N/A;   ESOPHAGOGASTRODUODENOSCOPY N/A 07/02/2015   Procedure: ESOPHAGOGASTRODUODENOSCOPY (EGD) looking in the esophagus stomach and upper small intestine with a lighted tube to evaluate and treat;  Surgeon: Christena Deem, MD;  Location: [facility];  Service: Endoscopy;  Laterality: N/A;   INCISION AND DRAINAGE ABSCESS N/A 10/11/2017   Procedure: INCISION AND DRAINAGE SKENE CYST;  Surgeon: Natale Milch, MD;  Location: [facility];  Service: Gynecology;  Laterality: N/A;   LAPAROSCOPIC BILATERAL SALPINGECTOMY Bilateral 08/28/2020   Procedure: LAPAROSCOPY with TUBAL PARTIAL SALPINGECTOMY;  Surgeon: Nadara Mustard, MD;  Location: [facility];  Service: Gynecology;  Laterality: Bilateral;    LAPAROSCOPY N/A 05/23/2015   Procedure: LAPAROSCOPY DIAGNOSTIC;  Surgeon: Nadara Mustard, MD;  Location: [facility];  Service: Gynecology;  Laterality: N/A;    Family History  Problem Relation Age of Onset   Lung cancer Mother    Cervical cancer Mother        early years   Cancer Maternal Aunt        unknow type   Cervical cancer Maternal Grandmother        early years   Heart disease Maternal Grandmother     Social History   Socioeconomic History   Marital status: Single    Spouse name: Not on file   Number of children: Not on file   Years of education: Not on file   Highest education level: Not on file  Occupational History   Not on file  Tobacco Use   Smoking status: Every Day    Current packs/day: 0.50    Average packs/day: 0.5 packs/day for 13.0 years (6.5 ttl pk-yrs)    Types: Cigarettes   Smokeless tobacco: Never   Tobacco comments:    Started smoking around age 8  Vaping Use   Vaping status: Never Used  Substance and Sexual Activity   Alcohol use: Yes    Alcohol/week: 0.0 standard drinks of alcohol    Comment: occasionally   Drug use: Yes    Types: Marijuana    Comment: Once every 3 months   Sexual activity: Yes    Birth control/protection: Surgical    Comment: Tubal Ligation  Other Topics  Concern   Not on file  Social History Narrative   Not on file   Social Determinants of Health   Financial Resource Strain: Not on file  Food Insecurity: Not on file  Transportation Needs: Not on file  Physical Activity: Not on file  Stress: Not on file  Social Connections: Not on file  Intimate Partner Violence: Not on file     Current Outpatient Medications:    acetaminophen (TYLENOL) 500 MG tablet, Take 500 mg by mouth every 6 (six) hours as needed., Disp: , Rfl:    busPIRone (BUSPAR) 15 MG tablet, Take 15 mg by mouth 2 (two) times daily. , Disp: , Rfl:    gabapentin (NEURONTIN) 300 MG capsule, Take 1 capsule by mouth 2 (two) times daily., Disp: , Rfl:     gabapentin (NEURONTIN) 600 MG tablet, Take by mouth., Disp: , Rfl:    pantoprazole (PROTONIX) 40 MG tablet, Take 40 mg by mouth daily. , Disp: , Rfl:      ROS:  Review of Systems BREAST: No symptoms   Objective: There were no vitals taken for this visit.   OBGyn Exam  Results: No results found for this or any previous visit (from the past 24 hour(s)).  Assessment/Plan: No diagnosis found.  No orders of the defined types were placed in this encounter.            GYN counsel {counseling: 16159}     F/U  No follow-ups on file.  Naziyah Tieszen B. Jeannelle Wiens, PA-C 08/09/2023 1:49 PM

## 2023-08-11 ENCOUNTER — Encounter: Payer: Self-pay | Admitting: Obstetrics and Gynecology

## 2023-08-11 ENCOUNTER — Other Ambulatory Visit (HOSPITAL_COMMUNITY)
Admission: RE | Admit: 2023-08-11 | Discharge: 2023-08-11 | Disposition: A | Discharge status: Home / Self Care | Payer: Medicaid Other | Source: Ambulatory Visit | Attending: Obstetrics and Gynecology | Admitting: Obstetrics and Gynecology

## 2023-08-11 ENCOUNTER — Ambulatory Visit (INDEPENDENT_AMBULATORY_CARE_PROVIDER_SITE_OTHER): Payer: Medicaid Other | Admitting: Obstetrics and Gynecology

## 2023-08-11 VITALS — BP 109/71 | HR 98 | Ht 63.0 in | Wt 171.0 lb

## 2023-08-11 DIAGNOSIS — Z1151 Encounter for screening for human papillomavirus (HPV): Secondary | ICD-10-CM | POA: Insufficient documentation

## 2023-08-11 DIAGNOSIS — Z01419 Encounter for gynecological examination (general) (routine) without abnormal findings: Secondary | ICD-10-CM | POA: Diagnosis not present

## 2023-08-11 DIAGNOSIS — Z124 Encounter for screening for malignant neoplasm of cervix: Secondary | ICD-10-CM | POA: Diagnosis present

## 2023-08-11 NOTE — Patient Instructions (Signed)
I value your feedback and you entrusting us with your care. If you get a [facility] patient survey, I would appreciate you taking the time to let us know about your experience today. Thank you! ? ? ?

## 2023-08-13 ENCOUNTER — Encounter: Payer: Self-pay | Admitting: Emergency Medicine

## 2023-08-13 ENCOUNTER — Ambulatory Visit: Payer: Medicaid Other

## 2023-08-13 ENCOUNTER — Ambulatory Visit
Admission: EM | Admit: 2023-08-13 | Discharge: 2023-08-13 | Disposition: A | Discharge status: Home / Self Care | Payer: Medicaid Other | Attending: Emergency Medicine | Admitting: Emergency Medicine

## 2023-08-13 DIAGNOSIS — J189 Pneumonia, unspecified organism: Secondary | ICD-10-CM | POA: Diagnosis present

## 2023-08-13 LAB — RESP PANEL BY RT-PCR (RSV, FLU A&B, COVID)  RVPGX2
Influenza A by PCR: NEGATIVE
Influenza B by PCR: NEGATIVE
Resp Syncytial Virus by PCR: NEGATIVE
SARS Coronavirus 2 by RT PCR: NEGATIVE

## 2023-08-13 LAB — GROUP A STREP BY PCR: Group A Strep by PCR: NOT DETECTED

## 2023-08-13 MED ORDER — AZITHROMYCIN 250 MG PO TABS: 250.0000 mg | ORAL_TABLET | Freq: Every day | ORAL | 0 refills | Status: DC

## 2023-08-13 MED ORDER — BENZONATATE 100 MG PO CAPS: 200.0000 mg | ORAL_CAPSULE | Freq: Three times a day (TID) | ORAL | 0 refills | Status: DC

## 2023-08-13 MED ORDER — PROMETHAZINE-DM 6.25-15 MG/5ML PO SYRP: 5.0000 mL | ORAL_SOLUTION | Freq: Four times a day (QID) | ORAL | 0 refills | Status: DC | PRN

## 2023-08-13 MED ORDER — ALBUTEROL SULFATE HFA 108 (90 BASE) MCG/ACT IN AERS: 2.0000 | INHALATION_SPRAY | RESPIRATORY_TRACT | 0 refills | Status: AC | PRN

## 2023-08-13 MED ORDER — AMOXICILLIN-POT CLAVULANATE 875-125 MG PO TABS
1.0000 | ORAL_TABLET | Freq: Two times a day (BID) | ORAL | 0 refills | Status: AC
Start: 2023-08-13 — End: 2023-08-20

## 2023-08-13 MED ORDER — AEROCHAMBER MV MISC: 2 refills | Status: AC

## 2023-08-13 NOTE — ED Triage Notes (Signed)
Patient c/o sore throat that started Thursday.  Patient reports her sore throat has gotten worse and also developed a cough yesterday.  Patient unsure of fevers.

## 2023-08-13 NOTE — Discharge Instructions (Signed)
Take the Augmentin twice daily with food for 7 days for treatment of your pneumonia.  Take the azithromycin as directed.  You will take 2 tablets on day 1 and then 1 tablet each day after that for total of 5 days for treatment of your pneumonia.  Use the albuterol inhaler with a spacer, 2 puffs every 4-6 hours, as needed for shortness of breath or wheezing.  Use the Tessalon Perles every 8 hours during the day as needed for cough.  Taken with a small sip of water.  These may give you numbness to the base of your tongue or metallic taste in mouth, this is normal.  Use the Promethazine DM cough syrup at bedtime for cough and congestion as it would make you drowsy.  Return for reevaluation if you have any new or worsening symptoms.  Follow-up with your primary care provider in 4 to 6 weeks for a repeat chest x-ray to ensure resolution of your pneumonia.

## 2023-08-13 NOTE — ED Provider Notes (Addendum)
[facility]    CSN: [id number] Arrival date & time: 08/13/23  0827      History   Chief Complaint Chief Complaint  Patient presents with   Sore Throat    HPI Mandy Hansen is a 32 y.o. female.   HPI  32 year old female with a past medical history significant for GERD and cervical dysplasia, status post bilateral salpingo ectomy presents for evaluation of a sore throat that started 2 days ago with a cough that developed yesterday.  The patient reports that she has pain in her chest with the cough only.  Her cough is nonproductive but she does feel that she is experiencing shortness of breath and wheezing.  She has had some nasal congestion with nasal bleeding but no nasal discharge.  She denies any ear pain  Past Medical History:  Diagnosis Date   Dysplasia of cervix 2013   dyplasia has resolved since birth of son in 2013.   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   Epigastric pain 06/30/2015    Past Surgical History:  Procedure Laterality Date   ABDOMINAL SURGERY     APPENDECTOMY     CESAREAN SECTION     CESAREAN SECTION N/A 2013   CYSTOSCOPY N/A 05/23/2015   Procedure: CYSTOSCOPY;  Surgeon: Nadara Mustard, MD;  Location: [facility] ORS;  Service: Gynecology;  Laterality: N/A;   ESOPHAGOGASTRODUODENOSCOPY N/A 07/02/2015   Procedure: ESOPHAGOGASTRODUODENOSCOPY (EGD) looking in the esophagus stomach and upper small intestine with a lighted tube to evaluate and treat;  Surgeon: Christena Deem, MD;  Location: [facility];  Service: Endoscopy;  Laterality: N/A;   INCISION AND DRAINAGE ABSCESS N/A 10/11/2017   Procedure: INCISION AND DRAINAGE SKENE CYST;  Surgeon: Natale Milch, MD;  Location: [facility]S;  Service:  Gynecology;  Laterality: N/A;   LAPAROSCOPIC BILATERAL SALPINGECTOMY Bilateral 08/28/2020   Procedure: LAPAROSCOPY with TUBAL PARTIAL SALPINGECTOMY;  Surgeon: Nadara Mustard, MD;  Location: [facility];  Service: Gynecology;  Laterality: Bilateral;   LAPAROSCOPY N/A 05/23/2015   Procedure: LAPAROSCOPY DIAGNOSTIC;  Surgeon: Nadara Mustard, MD;  Location: [facility];  Service: Gynecology;  Laterality: N/A;    OB History     Gravida  1   Para  1   Term  1   Preterm      AB      Living  1      SAB      IAB      Ectopic      Multiple      Live Births  1            Home Medications    Prior to Admission medications   Medication Sig Start Date End Date Taking? Authorizing Provider  acetaminophen (TYLENOL) 500 MG tablet Take 500 mg by mouth every 6 (six) hours as needed.   Yes [provider]  albuterol (VENTOLIN HFA) 108 (90 Base) MCG/ACT inhaler Inhale 2 puffs into the lungs every 4 (four) hours as needed. 08/13/23  Yes Becky Augusta, NP  amoxicillin-clavulanate (AUGMENTIN) 875-125 MG tablet Take 1 tablet by mouth every 12 (twelve) hours for 7 days. 08/13/23 08/20/23 Yes Becky Augusta, NP  azithromycin (ZITHROMAX Z-PAK) 250 MG  tablet Take 1 tablet (250 mg total) by mouth daily. Take 2 tablets on the first day and then 1 tablet daily thereafter for a total of 5 days of treatment. 08/13/23  Yes Becky Augusta, NP  benzonatate (TESSALON) 100 MG capsule Take 2 capsules (200 mg total) by mouth every 8 (eight) hours. 08/13/23  Yes Becky Augusta, NP  busPIRone (BUSPAR) 15 MG tablet Take 15 mg by mouth 2 (two) times daily.  11/13/19  Yes [provider]  gabapentin (NEURONTIN) 300 MG capsule Take 1 capsule by mouth 2 (two) times daily. 11/12/22 11/12/23 Yes [provider]  gabapentin (NEURONTIN) 600 MG tablet Take by mouth. 11/12/22 11/12/23 Yes [provider]  pantoprazole (PROTONIX) 40 MG tablet Take 40 mg by mouth daily.  07/01/15  Yes [provider]  promethazine-dextromethorphan (PROMETHAZINE-DM) 6.25-15 MG/5ML syrup Take 5 mLs by mouth 4 (four) times daily as needed. 08/13/23  Yes Becky Augusta, NP  Spacer/Aero-Holding Deretha Emory (AEROCHAMBER MV) inhaler Use as instructed 08/13/23  Yes Becky Augusta, NP    Family History Family History  Problem Relation Age of Onset   Lung cancer Mother    Cervical cancer Mother        early years   Cancer Maternal Aunt        unknow type   Cervical cancer Maternal Grandmother        early years   Heart disease Maternal Grandmother    Colon cancer Paternal Grandmother    Rectal cancer Paternal Grandmother     Social History Social History   Tobacco Use   Smoking status: Every Day    Current packs/day: 0.50    Average packs/day: 0.5 packs/day for 13.0 years (6.5 ttl pk-yrs)    Types: Cigarettes   Smokeless tobacco: Never   Tobacco comments:    Started smoking around age 72  Vaping Use   Vaping status: Never Used  Substance Use Topics   Alcohol use: Yes    Alcohol/week: 0.0 standard drinks of alcohol    Comment: occasionally   Drug use: Not Currently    Types: Marijuana    Comment: Once every 3 months     Allergies   Norethindrone-eth estradiol and Tramadol   Review of Systems Review of Systems  Constitutional:  Negative for fever.  HENT:  Positive for congestion, nosebleeds and sore throat. Negative for ear pain and rhinorrhea.   Respiratory:  Positive for cough, shortness of breath and wheezing.   Cardiovascular:  Positive for chest pain.       Chest pain is with coughing only.     Physical Exam Triage Vital Signs ED Triage Vitals [08/13/23 0849]  Encounter Vitals Group     BP      Systolic BP Percentile      Diastolic BP Percentile      Pulse      Resp      Temp      Temp src      SpO2      Weight 171 lb (77.6 kg)     Height 5\' 3"  (1.6 m)     Head Circumference      Peak Flow      Pain Score 7     Pain Loc      Pain Education      Exclude  from Growth Chart    No data found.  Updated Vital Signs BP 123/88 (BP Location: Right Arm)   Pulse (!) 101   Temp 98.4  F (36.9 C) (Oral)   Resp 14   Ht 5\' 3"  (1.6 m)   Wt 171 lb (77.6 kg)   LMP 08/06/2023 (Exact Date)   SpO2 97%   BMI 30.29 kg/m   Visual Acuity Right Eye Distance:   Left Eye Distance:   Bilateral Distance:    Right Eye Near:   Left Eye Near:    Bilateral Near:     Physical Exam Vitals and nursing note reviewed.  Constitutional:      Appearance: Normal appearance. She is not ill-appearing.  HENT:     Head: Normocephalic and atraumatic.     Right Ear: Tympanic membrane, ear canal and external ear normal. There is no impacted cerumen.     Left Ear: Tympanic membrane, ear canal and external ear normal. There is no impacted cerumen.     Nose: Congestion present. No rhinorrhea.     Comments: This mucosa is edematous but free of erythema.  No appreciable discharge or epistaxis on exam.    Mouth/Throat:     Mouth: Mucous membranes are moist.     Pharynx: Oropharynx is clear. Posterior oropharyngeal erythema present. No oropharyngeal exudate.     Comments: Soft palate, tonsillar pillars, posterior oropharynx all demonstrate erythema with mild injection.  No appreciable exudate. Cardiovascular:     Rate and Rhythm: Normal rate and regular rhythm.     Pulses: Normal pulses.     Heart sounds: Normal heart sounds. No murmur heard.    No friction rub. No gallop.  Pulmonary:     Effort: Pulmonary effort is normal.     Breath sounds: Normal breath sounds. No wheezing, rhonchi or rales.  Musculoskeletal:     Cervical back: Normal range of motion and neck supple. No tenderness.  Lymphadenopathy:     Cervical: No cervical adenopathy.  Skin:    General: Skin is warm and dry.     Capillary Refill: Capillary refill takes less than 2 seconds.     Findings: No rash.  Neurological:     General: No focal deficit present.     Mental Status: She is alert and oriented  to person, place, and time.      UC Treatments / Results  Labs (all labs ordered are listed, but only abnormal results are displayed) Labs Reviewed  GROUP A STREP BY PCR  RESP PANEL BY RT-PCR (RSV, FLU A&B, COVID)  [id number]    EKG   Radiology DG Chest 2 View  Result Date: 08/13/2023 CLINICAL DATA:  Cough and left-sided pleuritic chest pain for several days. EXAM: CHEST - 2 VIEW COMPARISON:  09/13/2019 FINDINGS: The heart size and mediastinal contours are within normal limits. New opacity is seen in the left upper lobe, consistent with pneumonia. Right lung is clear. IMPRESSION: Left upper lobe opacity, consistent with pneumonia. Electronically Signed   By: Danae Orleans M.D.   On: 08/13/2023 09:28    Procedures Procedures (including critical care time)  Medications Ordered in UC Medications - No data to display  Initial Impression / Assessment and Plan / UC Course  I have reviewed the triage vital signs and the nursing notes.  Pertinent labs & imaging results that were available during my care of the patient were reviewed by me and considered in my medical decision making (see chart for details).   Patient is a nontoxic-appearing 33 year old female presenting for evaluation of sore throat cough with pleuritic chest pain that began 2 days ago and is still present.  She reports  that her sore throat is worsening.  On exam she does have inflamed nasal mucosa and erythematous and injected tonsillar pillars, soft palate, and posterior oropharynx.  I will obtain a strep PCR to rule the presence of strep.  I will also obtain a respiratory panel to evaluate for the presence of COVID or influenza.  Additionally, because she is complaining of pleuritic chest pain, I will obtain a chest x-ray to evaluate for any acute cardiopulmonary pathology.  Strep PCR is negative.  Respiratory panel is negative for COVID, influenza, and RSV.  Chest x-ray independently reviewed and evaluated by me.   Impression: Patient has a linear opacity in the left upper lobe which is suspicious for pneumonia.  No other infiltrates or effusions noted.  Cardiomediastinal silhouette appears normal.  Radiology overread is pending.  New line Radiology impression states left upper lobe opacity consistent with pneumonia.  I will discharge patient with a diagnosis of community-acquired pneumonia on Augmentin 875 twice daily for 7 days and azithromycin once daily for 5 days.  Albuterol inhaler with a spacer, 1 to 2 puffs every 4-6 hours, as needed for any shortness of breath or wheezing.  Tessalon Perles and Promethazine DM cough syrup for cough and congestion.    Final Clinical Impressions(s) / UC Diagnoses   Final diagnoses:  Community acquired pneumonia of left upper lobe of lung     Discharge Instructions      Take the Augmentin twice daily with food for 7 days for treatment of your pneumonia.  Take the azithromycin as directed.  You will take 2 tablets on day 1 and then 1 tablet each day after that for total of 5 days for treatment of your pneumonia.  Use the albuterol inhaler with a spacer, 2 puffs every 4-6 hours, as needed for shortness of breath or wheezing.  Use the Tessalon Perles every 8 hours during the day as needed for cough.  Taken with a small sip of water.  These may give you numbness to the base of your tongue or metallic taste in mouth, this is normal.  Use the Promethazine DM cough syrup at bedtime for cough and congestion as it would make you drowsy.  Return for reevaluation if you have any new or worsening symptoms.  Follow-up with your primary care provider in 4 to 6 weeks for a repeat chest x-ray to ensure resolution of your pneumonia.      ED Prescriptions     Medication Sig Dispense Auth. Provider   amoxicillin-clavulanate (AUGMENTIN) 875-125 MG tablet Take 1 tablet by mouth every 12 (twelve) hours for 7 days. 14 tablet Becky Augusta, NP   azithromycin (ZITHROMAX Z-PAK)  250 MG tablet Take 1 tablet (250 mg total) by mouth daily. Take 2 tablets on the first day and then 1 tablet daily thereafter for a total of 5 days of treatment. 6 tablet Becky Augusta, NP   benzonatate (TESSALON) 100 MG capsule Take 2 capsules (200 mg total) by mouth every 8 (eight) hours. 21 capsule Becky Augusta, NP   albuterol (VENTOLIN HFA) 108 (90 Base) MCG/ACT inhaler Inhale 2 puffs into the lungs every 4 (four) hours as needed. 18 g Becky Augusta, NP   Spacer/Aero-Holding Chambers (AEROCHAMBER MV) inhaler Use as instructed 1 each Becky Augusta, NP   promethazine-dextromethorphan (PROMETHAZINE-DM) 6.25-15 MG/5ML syrup Take 5 mLs by mouth 4 (four) times daily as needed. 118 mL Becky Augusta, NP      PDMP not reviewed this encounter.   Becky Augusta, NP 08/13/23  1027    Becky Augusta, NP 08/13/23 [id number]

## 2023-08-15 LAB — CYTOLOGY - PAP
Comment: NEGATIVE
Diagnosis: NEGATIVE
High risk HPV: NEGATIVE

## 2024-01-19 ENCOUNTER — Other Ambulatory Visit: Payer: Self-pay | Admitting: Orthopedic Surgery

## 2024-01-19 DIAGNOSIS — G8929 Other chronic pain: Secondary | ICD-10-CM

## 2024-01-19 DIAGNOSIS — M5412 Radiculopathy, cervical region: Secondary | ICD-10-CM

## 2024-01-19 DIAGNOSIS — M542 Cervicalgia: Secondary | ICD-10-CM

## 2024-01-23 ENCOUNTER — Encounter: Payer: Self-pay | Admitting: Orthopedic Surgery

## 2024-01-25 ENCOUNTER — Inpatient Hospital Stay: Admission: RE | Admit: 2024-01-25 | Source: Ambulatory Visit

## 2024-02-01 ENCOUNTER — Inpatient Hospital Stay: Admission: RE | Admit: 2024-02-01 | Source: Ambulatory Visit

## 2024-02-04 ENCOUNTER — Inpatient Hospital Stay: Admission: RE | Admit: 2024-02-04 | Source: Ambulatory Visit

## 2024-02-13 ENCOUNTER — Ambulatory Visit
Admission: RE | Admit: 2024-02-13 | Discharge: 2024-02-13 | Disposition: A | Discharge status: Home / Self Care | Source: Ambulatory Visit | Attending: Orthopedic Surgery | Admitting: Orthopedic Surgery

## 2024-02-13 DIAGNOSIS — M5412 Radiculopathy, cervical region: Secondary | ICD-10-CM

## 2024-02-13 DIAGNOSIS — G8929 Other chronic pain: Secondary | ICD-10-CM

## 2024-02-13 DIAGNOSIS — M542 Cervicalgia: Secondary | ICD-10-CM

## 2024-06-14 ENCOUNTER — Other Ambulatory Visit: Payer: Self-pay | Admitting: Primary Care

## 2024-06-14 DIAGNOSIS — R1084 Generalized abdominal pain: Secondary | ICD-10-CM

## 2024-06-22 ENCOUNTER — Ambulatory Visit
Admission: RE | Admit: 2024-06-22 | Discharge: 2024-06-22 | Disposition: A | Discharge status: Home / Self Care | Source: Ambulatory Visit | Attending: Primary Care | Admitting: Primary Care

## 2024-06-22 DIAGNOSIS — R1084 Generalized abdominal pain: Secondary | ICD-10-CM | POA: Insufficient documentation

## 2024-10-05 ENCOUNTER — Encounter: Payer: Self-pay | Admitting: Emergency Medicine

## 2024-10-05 ENCOUNTER — Ambulatory Visit
Admission: EM | Admit: 2024-10-05 | Discharge: 2024-10-05 | Disposition: A | Discharge status: Home / Self Care | Payer: Self-pay | Attending: Family Medicine | Admitting: Family Medicine

## 2024-10-05 DIAGNOSIS — S29019A Strain of muscle and tendon of unspecified wall of thorax, initial encounter: Secondary | ICD-10-CM

## 2024-10-05 MED ORDER — MELOXICAM 7.5 MG PO TABS: 7.5000 mg | ORAL_TABLET | Freq: Two times a day (BID) | ORAL | 0 refills | Status: AC

## 2024-10-05 MED ORDER — CYCLOBENZAPRINE HCL 5 MG PO TABS: 5.0000 mg | ORAL_TABLET | Freq: Three times a day (TID) | ORAL | 0 refills | Status: AC | PRN

## 2024-10-05 NOTE — ED Provider Notes (Signed)
 " [facility]    CSN: [id number] Arrival date & time: 10/05/24  0943      History   Chief Complaint Chief Complaint  Patient presents with   Back Pain    HPI  HPI Mandy Hansen is a 34 y.o. female.   Mandy Hansen presents for worsening low back pain that started yesterday morning.  No heavy lifting or injury nor any falls. Pain is described as sharp stabbing and does not radiate. Pain rated 6/10.  Endorses weakness, numbness, tingling, pelvic pain. Pain worse with turning and deep breathing. Tried heat, ice and Tylenol  without reief.   Continues to have pain with movement. Mandy Hansen does not feel like her legs are weak.   Has never injured her back before.   Pain is worse at night and she is unable to get sleep.  Moving in bed wakes her up.    Fever : no  Weight loss: no Perianal numbness: no  Bowel incontinence: no Bladder incontinence: no Trauma: no  Hydration: normal  Nausea: yes Vomiting: no Abdominal pain: no Dysuria:no Hematuria: no Hx of kidney stones: no Sleep disturbance: yes Neck Pain: not new  Headache: not new     Past Medical History:  Diagnosis Date   Dysplasia of cervix 2013   dyplasia has resolved since birth of son in 2013.   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   Epigastric pain 06/30/2015    Past Surgical History:  Procedure Laterality Date   ABDOMINAL SURGERY     APPENDECTOMY     CESAREAN SECTION     CESAREAN SECTION N/A 2013   CYSTOSCOPY N/A 05/23/2015   Procedure: CYSTOSCOPY;  Surgeon: Lamar SHAUNNA Lesches, MD;  Location: [facility] ORS;  Service: Gynecology;  Laterality: N/A;   ESOPHAGOGASTRODUODENOSCOPY N/A 07/02/2015   Procedure: ESOPHAGOGASTRODUODENOSCOPY (EGD) looking in the esophagus stomach and  upper small intestine with a lighted tube to evaluate and treat;  Surgeon: Gladis RAYMOND Mariner, MD;  Location: [facility] ENDOSCOPY;  Service: Endoscopy;  Laterality: N/A;   INCISION AND DRAINAGE ABSCESS N/A 10/11/2017   Procedure: INCISION AND DRAINAGE SKENE CYST;  Surgeon: Victor Claudell SAUNDERS, MD;  Location: [facility];  Service: Gynecology;  Laterality: N/A;   LAPAROSCOPIC BILATERAL SALPINGECTOMY Bilateral 08/28/2020   Procedure: LAPAROSCOPY with TUBAL PARTIAL SALPINGECTOMY;  Surgeon: Lesches Lamar SHAUNNA, MD;  Location: [facility];  Service: Gynecology;  Laterality: Bilateral;   LAPAROSCOPY N/A 05/23/2015   Procedure: LAPAROSCOPY DIAGNOSTIC;  Surgeon: Lamar SHAUNNA Lesches, MD;  Location: [facility];  Service: Gynecology;  Laterality: N/A;    OB History     Gravida  1   Para  1   Term  1   Preterm      AB      Living  1      SAB      IAB      Ectopic      Multiple      Live Births  1            Home Medications    Prior to Admission medications  Medication Sig Start Date End Date Taking? Authorizing Provider  acetaminophen  (TYLENOL ) 500 MG tablet Take 500 mg by mouth every 6 (six)  hours as needed.   Yes [provider]  busPIRone (BUSPAR) 15 MG tablet Take 15 mg by mouth 2 (two) times daily.  11/13/19  Yes [provider]  cyclobenzaprine  (FLEXERIL ) 5 MG tablet Take 1 tablet (5 mg total) by mouth 3 (three) times daily as needed. 10/05/24  Yes Kionna Brier, DO  meloxicam  (MOBIC ) 7.5 MG tablet Take 1 tablet (7.5 mg total) by mouth 2 (two) times daily. 10/05/24  Yes Karsen Fellows, DO  pantoprazole  (PROTONIX ) 40 MG tablet Take 40 mg by mouth daily.  07/01/15  Yes [provider]  albuterol  (VENTOLIN  HFA) 108 (90 Base) MCG/ACT inhaler Inhale 2 puffs into the lungs every 4 (four) hours as needed. 08/13/23   Bernardino Ditch, NP  gabapentin (NEURONTIN) 300 MG capsule Take 300 mg by mouth 2 (two) times daily.    [provider]  Spacer/Aero-Holding Chambers  (AEROCHAMBER MV) inhaler Use as instructed 08/13/23   Bernardino Ditch, NP    Family History Family History  Problem Relation Age of Onset   Lung cancer Mother    Cervical cancer Mother        early years   Cancer Maternal Aunt        unknow type   Cervical cancer Maternal Grandmother        early years   Heart disease Maternal Grandmother    Colon cancer Paternal Grandmother    Rectal cancer Paternal Grandmother     Social History Social History[1]   Allergies   Norethindrone-eth estradiol and Tramadol    Review of Systems Review of Systems: egative unless otherwise stated in HPI.      Physical Exam Triage Vital Signs ED Triage Vitals  Encounter Vitals Group     BP      Girls Systolic BP Percentile      Girls Diastolic BP Percentile      Boys Systolic BP Percentile      Boys Diastolic BP Percentile      Pulse      Resp      Temp      Temp src      SpO2      Weight      Height      Head Circumference      Peak Flow      Pain Score      Pain Loc      Pain Education      Exclude from Growth Chart    No data found.  Updated Vital Signs BP 119/84 (BP Location: Right Arm)   Pulse 82   Temp 98.3 F (36.8 C) (Oral)   Resp 16   Ht 5' 3 (1.6 m)   Wt 77.6 kg   LMP 09/26/2024 (Approximate)   SpO2 98%   BMI 30.30 kg/m   Visual Acuity Right Eye Distance:   Left Eye Distance:   Bilateral Distance:    Right Eye Near:   Left Eye Near:    Bilateral Near:     Physical Exam GEN: well appearing female in no acute distress  CVS: well perfused  RESP: speaking in full sentences without pause, no respiratory distress  MSK:  Thoracic and Lumbar Spine: - Inspection: no gross deformity or asymmetry, swelling or ecchymosis. No skin changes  - Palpation: No TTP over the lumbar spinous processes, left lumbar paraspinal muscle tenderness and hypertonicity, no SI joint tenderness bilaterally - ROM: limited active ROM of the lumbar spine in flexion and extension, and  thoracic rotation  due to pain  - Neuro: sensation intact  SKIN: warm, dry, no overly skin rash or erythema    UC Treatments / Results  Labs (all labs ordered are listed, but only abnormal results are displayed) Labs Reviewed - No data to display  EKG   Radiology No results found.   Procedures Procedures (including critical care time)  Medications Ordered in UC Medications - No data to display  Initial Impression / Assessment and Plan / UC Course  I have reviewed the triage vital signs and the nursing notes.  Pertinent labs & imaging results that were available during my care of the patient were reviewed by me and considered in my medical decision making (see chart for details).      Pt is a 34 y.o.  female with 1 day mid  back pain without trauma or fall.  Has history of low back pain.  On chart review, no previous imaging available for review.  Imaging deferred today.   Patient to gradually return to normal activities, as tolerated and continue ordinary activities within the limits permitted by pain. Prescribed Naproxen sodium  and muscle relaxer  for pain relief.  Advised patient to avoid other NSAIDs while taking Naprosyn. Tylenol  and Lidocaine  patches PRN for multimodal pain relief. Counseled patient on red flag symptoms and when to seek immediate care.  No red flags suggesting cauda equina syndrome or progressive major motor weakness. Patient to follow up with orthopedic provider if symptoms do not improve with conservative treatment.  Return and ED precautions given.    Discussed MDM, treatment plan and plan for follow-up with patient who agrees with plan.   Final Clinical Impressions(s) / UC Diagnoses   Final diagnoses:  Thoracic myofascial strain, initial encounter     Discharge Instructions      If medication was prescribed, stop by the pharmacy to pick up your prescriptions.  For your back pain, Take 1500 mg Tylenol  twice a day, take muscle relaxer and  Mobic ,  as needed for pain. Consider stopping by the pharmacy or dollar store to pick up some Lidocaine  patches. Apply for 12 hours and then remove.   Watch for worsening symptoms such as an increasing weakness or loss of sensation in your arms or legs, increasing pain and/or the loss of bladder or bowel function. Should any of these occur, go to the emergency department immediately.       ED Prescriptions     Medication Sig Dispense Auth. Provider   cyclobenzaprine  (FLEXERIL ) 5 MG tablet Take 1 tablet (5 mg total) by mouth 3 (three) times daily as needed. 30 tablet Tyrik Stetzer, DO   meloxicam  (MOBIC ) 7.5 MG tablet Take 1 tablet (7.5 mg total) by mouth 2 (two) times daily. 30 tablet Asra Gambrel, DO      PDMP not reviewed this encounter.     [1]  Social History Tobacco Use   Smoking status: Every Day    Current packs/day: 0.50    Average packs/day: 0.5 packs/day for 13.0 years (6.5 ttl pk-yrs)    Types: Cigarettes   Smokeless tobacco: Never   Tobacco comments:    Started smoking around age 64  Vaping Use   Vaping status: Never Used  Substance Use Topics   Alcohol use: Yes    Alcohol/week: 0.0 standard drinks of alcohol    Comment: occasionally   Drug use: Not Currently    Types: Marijuana    Comment: Once every 3 months     Zaheer Wageman,  DO 10/05/24 1026  "

## 2024-10-05 NOTE — ED Triage Notes (Signed)
 Pt c/o lower back pain. Started yesterday. She states it has gotten worse. Denies urinary symptoms.

## 2024-10-05 NOTE — Discharge Instructions (Addendum)
 If medication was prescribed, stop by the pharmacy to pick up your prescriptions.  For your back pain, Take 1500 mg Tylenol  twice a day, take muscle relaxer and Mobic ,  as needed for pain. Consider stopping by the pharmacy or dollar store to pick up some Lidocaine  patches. Apply for 12 hours and then remove.   Watch for worsening symptoms such as an increasing weakness or loss of sensation in your arms or legs, increasing pain and/or the loss of bladder or bowel function. Should any of these occur, go to the emergency department immediately.
